# Patient Record
Sex: Male | Born: 1940 | Race: White | Hispanic: No | Smoking: Never smoker
Health system: Southern US, Community
[De-identification: ages and names within clinical notes are randomized; demographics above are authoritative.]

## PROBLEM LIST (undated history)

## (undated) DIAGNOSIS — E785 Hyperlipidemia, unspecified: Secondary | ICD-10-CM

## (undated) DIAGNOSIS — I10 Essential (primary) hypertension: Secondary | ICD-10-CM

## (undated) DIAGNOSIS — I639 Cerebral infarction, unspecified: Secondary | ICD-10-CM

## (undated) HISTORY — PX: BACK SURGERY: SHX140

---

## 2000-12-25 ENCOUNTER — Encounter: Payer: Self-pay | Admitting: Family Medicine

## 2000-12-25 LAB — CONVERTED CEMR LAB: TSH: 1.51 microintl units/mL

## 2002-04-15 ENCOUNTER — Encounter: Payer: Self-pay | Admitting: Family Medicine

## 2002-04-15 LAB — CONVERTED CEMR LAB
Blood Glucose, Fasting: 97 mg/dL
PSA: 6.8 ng/mL
TSH: 1.82 microintl units/mL

## 2003-10-24 ENCOUNTER — Encounter: Payer: Self-pay | Admitting: Family Medicine

## 2003-10-24 LAB — CONVERTED CEMR LAB: Blood Glucose, Fasting: 101 mg/dL

## 2004-12-25 ENCOUNTER — Ambulatory Visit: Payer: Self-pay | Admitting: Family Medicine

## 2004-12-25 LAB — CONVERTED CEMR LAB
RBC count: 4.61 10*6/uL
TSH: 1.76 microintl units/mL

## 2005-05-01 ENCOUNTER — Ambulatory Visit: Payer: Self-pay | Admitting: Family Medicine

## 2005-05-06 ENCOUNTER — Ambulatory Visit: Payer: Self-pay | Admitting: Family Medicine

## 2005-05-22 ENCOUNTER — Ambulatory Visit: Payer: Self-pay | Admitting: Family Medicine

## 2005-06-04 ENCOUNTER — Ambulatory Visit: Payer: Self-pay | Admitting: Pulmonary Disease

## 2005-06-18 ENCOUNTER — Ambulatory Visit: Payer: Self-pay | Admitting: Family Medicine

## 2005-07-03 ENCOUNTER — Ambulatory Visit: Payer: Self-pay | Admitting: Pulmonary Disease

## 2005-08-05 ENCOUNTER — Ambulatory Visit: Payer: Self-pay | Admitting: Family Medicine

## 2005-08-07 ENCOUNTER — Ambulatory Visit: Payer: Self-pay | Admitting: Family Medicine

## 2005-11-06 ENCOUNTER — Ambulatory Visit: Payer: Self-pay | Admitting: Family Medicine

## 2005-12-23 ENCOUNTER — Ambulatory Visit: Payer: Self-pay | Admitting: Family Medicine

## 2005-12-23 LAB — CONVERTED CEMR LAB: PSA: 6.79 ng/mL

## 2005-12-25 ENCOUNTER — Ambulatory Visit: Payer: Self-pay | Admitting: Family Medicine

## 2006-03-26 ENCOUNTER — Ambulatory Visit: Payer: Self-pay | Admitting: Family Medicine

## 2006-03-26 LAB — CONVERTED CEMR LAB: PSA: 5.56 ng/mL

## 2006-05-06 ENCOUNTER — Ambulatory Visit: Payer: Self-pay | Admitting: Family Medicine

## 2006-07-29 ENCOUNTER — Ambulatory Visit: Payer: Self-pay | Admitting: Family Medicine

## 2006-07-29 LAB — CONVERTED CEMR LAB
PSA: 6.57 ng/mL
TSH: 1.91 microintl units/mL

## 2006-07-31 ENCOUNTER — Ambulatory Visit: Payer: Self-pay | Admitting: Family Medicine

## 2006-09-09 ENCOUNTER — Ambulatory Visit: Payer: Self-pay | Admitting: Family Medicine

## 2006-10-08 ENCOUNTER — Ambulatory Visit: Payer: Self-pay | Admitting: Family Medicine

## 2006-11-02 ENCOUNTER — Ambulatory Visit: Payer: Self-pay | Admitting: Family Medicine

## 2007-01-18 ENCOUNTER — Ambulatory Visit: Payer: Self-pay | Admitting: Family Medicine

## 2007-01-25 ENCOUNTER — Ambulatory Visit: Payer: Self-pay | Admitting: Family Medicine

## 2007-02-01 ENCOUNTER — Ambulatory Visit: Payer: Self-pay | Admitting: Family Medicine

## 2007-08-24 ENCOUNTER — Encounter: Payer: Self-pay | Admitting: Family Medicine

## 2007-08-24 LAB — CONVERTED CEMR LAB: RBC count: 4.64 10*6/uL

## 2007-09-08 ENCOUNTER — Telehealth: Payer: Self-pay | Admitting: Family Medicine

## 2007-11-15 ENCOUNTER — Telehealth (INDEPENDENT_AMBULATORY_CARE_PROVIDER_SITE_OTHER): Payer: Self-pay | Admitting: *Deleted

## 2008-10-26 ENCOUNTER — Ambulatory Visit: Payer: Self-pay | Admitting: Gastroenterology

## 2008-12-04 ENCOUNTER — Telehealth: Payer: Self-pay | Admitting: Family Medicine

## 2008-12-04 ENCOUNTER — Encounter: Payer: Self-pay | Admitting: Family Medicine

## 2008-12-04 DIAGNOSIS — N411 Chronic prostatitis: Secondary | ICD-10-CM | POA: Insufficient documentation

## 2008-12-04 DIAGNOSIS — I7300 Raynaud's syndrome without gangrene: Secondary | ICD-10-CM

## 2008-12-04 DIAGNOSIS — R972 Elevated prostate specific antigen [PSA]: Secondary | ICD-10-CM

## 2008-12-04 DIAGNOSIS — I8000 Phlebitis and thrombophlebitis of superficial vessels of unspecified lower extremity: Secondary | ICD-10-CM | POA: Insufficient documentation

## 2008-12-04 DIAGNOSIS — G2581 Restless legs syndrome: Secondary | ICD-10-CM

## 2008-12-04 DIAGNOSIS — E7800 Pure hypercholesterolemia, unspecified: Secondary | ICD-10-CM | POA: Insufficient documentation

## 2014-02-24 ENCOUNTER — Ambulatory Visit: Payer: Self-pay | Admitting: Gastroenterology

## 2014-03-01 LAB — PATHOLOGY REPORT

## 2020-03-22 MED ORDER — ACETAMINOPHEN 325 MG PO TABS
650.00 | ORAL_TABLET | ORAL | Status: DC
Start: ? — End: 2020-03-22

## 2020-03-22 MED ORDER — LIDOCAINE HCL 1 % IJ SOLN
0.50 | INTRAMUSCULAR | Status: DC
Start: ? — End: 2020-03-22

## 2020-03-22 MED ORDER — CLOPIDOGREL BISULFATE 75 MG PO TABS
75.00 | ORAL_TABLET | ORAL | Status: DC
Start: 2020-03-22 — End: 2020-03-22

## 2020-03-22 MED ORDER — PSYLLIUM 95 % PO PACK
1.00 | PACK | ORAL | Status: DC
Start: 2020-03-22 — End: 2020-03-22

## 2020-03-22 MED ORDER — CYANOCOBALAMIN 500 MCG PO TABS
1000.00 | ORAL_TABLET | ORAL | Status: DC
Start: 2020-03-22 — End: 2020-03-22

## 2020-03-22 MED ORDER — SODIUM CHLORIDE FLUSH 0.9 % IV SOLN
5.00 | INTRAVENOUS | Status: DC
Start: 2020-03-21 — End: 2020-03-22

## 2020-03-22 MED ORDER — AMLODIPINE BESYLATE 5 MG PO TABS
5.00 | ORAL_TABLET | ORAL | Status: DC
Start: 2020-03-22 — End: 2020-03-22

## 2020-03-22 MED ORDER — ENOXAPARIN SODIUM 40 MG/0.4ML ~~LOC~~ SOLN
40.00 | SUBCUTANEOUS | Status: DC
Start: 2020-03-22 — End: 2020-03-22

## 2020-03-22 MED ORDER — FINASTERIDE 5 MG PO TABS
5.00 | ORAL_TABLET | ORAL | Status: DC
Start: 2020-03-22 — End: 2020-03-22

## 2020-03-22 MED ORDER — FAMOTIDINE 20 MG PO TABS
20.00 | ORAL_TABLET | ORAL | Status: DC
Start: 2020-03-22 — End: 2020-03-22

## 2020-03-22 MED ORDER — SIMVASTATIN 40 MG PO TABS
80.00 | ORAL_TABLET | ORAL | Status: DC
Start: 2020-03-21 — End: 2020-03-22

## 2020-03-22 MED ORDER — GENERIC EXTERNAL MEDICATION
Status: DC
Start: ? — End: 2020-03-22

## 2020-03-22 MED ORDER — CARBOXYMETHYLCELLULOSE SODIUM 0.5 % OP SOLN
1.00 | OPHTHALMIC | Status: DC
Start: ? — End: 2020-03-22

## 2020-03-28 ENCOUNTER — Ambulatory Visit: Payer: Self-pay | Admitting: Physical Therapy

## 2020-03-28 ENCOUNTER — Ambulatory Visit: Payer: Medicare HMO | Attending: Adult Health Nurse Practitioner | Admitting: Occupational Therapy

## 2020-03-28 ENCOUNTER — Encounter: Payer: Self-pay | Admitting: Occupational Therapy

## 2020-03-28 ENCOUNTER — Other Ambulatory Visit: Payer: Self-pay

## 2020-03-28 DIAGNOSIS — R278 Other lack of coordination: Secondary | ICD-10-CM

## 2020-03-28 DIAGNOSIS — M6281 Muscle weakness (generalized): Secondary | ICD-10-CM

## 2020-03-28 NOTE — Therapy (Addendum)
[facility] MAIN [facility] SERVICES [address] [place], [place], [address] Phone: [phone]   Fax:  [phone]  Occupational Therapy Evaluation  Patient Details  Name: Edward Campbell MRN: [id number] Date of Birth: 07/29/1941 Referring Provider (OT): Dr.  Rolin Barry   Encounter Date: 03/28/2020  OT End of Session - 03/28/20 1220    Visit Number  1    Number of Visits  12    Date for OT Re-Evaluation  06/20/20    Authorization Type  FOTO    Authorization Time Period  Progress report period starting    OT Start Time  1103    OT Stop Time  1200    OT Time Calculation (min)  57 min    Activity Tolerance  Patient tolerated treatment well    Behavior During Therapy  [facility] for tasks assessed/performed       History reviewed. No pertinent past medical history.  History reviewed. No pertinent surgical history.  There were no vitals filed for this visit.  Subjective Assessment - 03/28/20 1206    Subjective   Pt. reports planning to move into another accessible apartment soon.    Patient is accompanied by:  Family member    Pertinent History  Pt. is an 79 y.o. male who was admitted to [facility] on 03/18/2020 for 5 days with a CVA involving the LUE. Pt. plans to have meals on wheels begin soon. Pt. resides home alone, however has strong support form his spiritual family.    Limitations  LUE strength, motor control, and [facility] skills.    Currently in Pain?  No/denies        [facility] OT Assessment - 03/28/20 0001      Assessment   Medical Diagnosis  CVA    Referring Provider (OT)  Dr.  Rolin Barry    Onset Date/Surgical Date  03/18/20    Hand Dominance  Right    Next MD Visit  04/02/2020      Precautions   Precautions  None      Balance Screen   Has the patient fallen in the past 6 months  No    Has the patient had a decrease in activity level because of a fear of falling?   No    Is the patient reluctant to leave their home because of a fear of  falling?   No      Home  Environment   Family/patient expects to be discharged to:  Private residence    Living Arrangements  Spouse/significant other    Available Help at Discharge  Friend(s)    Type of Home  Aartment    Home Access  Level entry    Home Layout  One level    Bathroom Shower/Tub  Tub/Shower unit;Walk-in Information systems manager - 2 wheels;Cane - single point;Shower seat;Hand held Public house manager    Lives With  Alone      Prior Function   Level of Independence  Independent    Vocation  Retired    Insurance underwriter work, Conservation officer, nature at [employer]      ADL   Eating/Feeding  Independent    Academic librarian  Toileting -  Catering manager independent      IADL   Prior Level of Function Shopping  Independent   Independent   Shopping  Needs to be accompanied on any shopping trip    Prior Level of Function Light Housekeeping  [place] house alone or with occasional assistance;Performs light daily tasks such as dishwashing, bed making    Prior Level of Function Meal Prep  Independent    Meal Prep  Able to complete simple warm meal prep;Able to complete simple cold meal and snack prep    Prior Level of Function Sales executive  Relies on family or friends for transportation    Prior Level of Function Medication Managment  Independent    Medication Management  Is responsible for taking medication in correct dosages at correct time    Prior Level of Function Loss adjuster, chartered financial matters independently (budgets, writes checks, pays rent, bills goes to bank), collects and keeps track of income       Mobility   Mobility Status  Independent      Written Expression   Dominant Hand  Right    Handwriting  100% legible      Vision - History   Baseline Vision  Wears glasses only for reading      Activity Tolerance   Activity Tolerance  Tolerates 10-20 min activity with multiple rests      Cognition   Overall Cognitive Status  Within Functional Limits for tasks assessed      Coordination   Right 9 Hole Peg Test  21 sec.    Left 9 Hole Peg Test  35 sec.      Strength   Overall Strength Comments  LUE strength 4+/5, RUE strength 5/5      Hand Function   Right Hand Grip (lbs)  88    Right Hand Lateral Pinch  20 lbs    Right Hand 3 Point Pinch  18 lbs    Left Hand Grip (lbs)  67    Left Hand Lateral Pinch  17 lbs    Left 3 point pinch  15 lbs      There. Ex:  Pt. was provided with green theraputty, and education about using it for grip strength, and lateral pinch strengthening. Plan  To have pt. bring it back next session to work on additional exercises, and provide him with a HEP.                OT Education - 03/28/20 1214    Education Details OT services POC, Green theraputty   Person(s) Educated  Patient    Methods  Explanation    Comprehension  Verbalized understanding          OT Long Term Goals - 03/28/20 1228      OT LONG TERM GOAL #1   Title  Pt. will improve LUE strength by 1 mm grade to improve ADLs, and IADL functioning.    Baseline  Eval: LUE strength: 4+/5    Time  12    Period  Weeks    Status  New    Target Date  06/20/20      OT LONG TERM GOAL #2   Title  Pt. will improve left grip strength by 10# to be able to improve his ability to hold  ADL objects  in his hand.    Baseline  Eval: limited Left gips strength, and ability to hold objects steady in his hand.    Time  12    Period  Weeks    Status  New    Target Date  06/20/20      OT LONG TERM GOAL #3   Title  Pt. will improve LUE motor control in order to independently reach up  to place items on a shelf.    Baseline  Eval: Limtied motor control with reaching.    Time  12    Period  Weeks    Status  New    Target Date  06/20/20      OT LONG TERM GOAL #4   Title  Pt. will improve Left hand FMC skills by 3 sec. to be able to manipulate small objects independently for ADLs.    Baseline  Eval: limited left hand [facility] skills.    Time  0    Period  Weeks    Status  New    Target Date  06/20/20            Plan - 03/28/20 1221    Clinical Impression Statement Pt. is an 79 y.o. 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 Pt. presents with limited  BUE strength, grip strength, impaired motor control, and [facility] skills. Pt.'s LUE is resolving, and he has returned to performing most ADLs independently. Pt. nwill benefit from skilled OT services to improve LUE strength,  grip strength, moor control, and [facility] skills in order to be able to hold items better, reach for objects with more control, and manipulate small objects with his left hand. Pt. Continues to work on these skills in order to work towards maximizing independence with ADLs, and IADLs..    Occupational performance deficits (Please refer to evaluation for details):  ADL's;IADL's    Rehab Potential  Excellent    Clinical Decision Making  Limited treatment options, no task modification necessary    Comorbidities Affecting Occupational Performance:  May have comorbidities impacting occupational performance    Modification or Assistance to Complete Evaluation   Min-Moderate modification of tasks or assist with assess necessary to complete eval    OT Frequency  1x / week    OT Duration  12 weeks    OT Treatment/Interventions  Self-care/ADL training;Therapeutic exercise;Patient/family education;DME and/or AE instruction;Therapeutic activities;Neuromuscular education    Consulted and Agree with Plan of Care  Patient       Patient will benefit from skilled therapeutic intervention in order to improve the following deficits and  impairments:           Visit Diagnosis: Muscle weakness (generalized)  Other lack of coordination    Problem List Patient Active Problem List   Diagnosis Date Noted  . HYPERCHOLESTEROLEMIA 12/04/2008  . 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 SUPERFICIAL PHLEBITIS 12/04/2008  . CHRONIC PROSTATITIS 12/04/2008  . PROSTATE SPECIFIC ANTIGEN, ELEVATED 12/04/2008    Olegario Messier, MS, OTR/L 03/28/2020, 12:35 PM  [facility] MAIN [facility] SERVICES [address] [place], [place], [address] Phone: [phone]   Fax:  [phone]  Name: Edward Campbell MRN: [id number] Date of Birth: [birth year]

## 2020-04-02 ENCOUNTER — Encounter: Payer: Medicare HMO | Admitting: Occupational Therapy

## 2020-04-04 ENCOUNTER — Encounter: Payer: Self-pay | Admitting: Occupational Therapy

## 2020-04-04 ENCOUNTER — Ambulatory Visit: Payer: Medicare HMO | Admitting: Occupational Therapy

## 2020-04-04 ENCOUNTER — Other Ambulatory Visit: Payer: Self-pay

## 2020-04-04 DIAGNOSIS — M6281 Muscle weakness (generalized): Secondary | ICD-10-CM | POA: Diagnosis not present

## 2020-04-04 NOTE — Therapy (Signed)
[place] MAIN [facility] SERVICES [address] [place], [place], [address] Phone: [phone]   Fax:  [phone]  Occupational Therapy Treatment  Patient Details  Name: Edward Campbell MRN: [id number] Date of Birth: 1941-05-20 Referring Provider (OT): Dr.  Johny Drilling   Encounter Date: 04/04/2020  OT End of Session - 04/04/20 1224    Visit Number  2    Number of Visits  12    Date for OT Re-Evaluation  06/20/20    Authorization Type  FOTO    Authorization Time Period  Progress report period starting 04/04/2020    OT Start Time  1145    OT Stop Time  1230    OT Time Calculation (min)  45 min    Activity Tolerance  Patient tolerated treatment well    Behavior During Therapy  [facility] for tasks assessed/performed       History reviewed. No pertinent past medical history.  History reviewed. No pertinent surgical history.  There were no vitals filed for this visit.  Subjective Assessment - 04/04/20 1308    Subjective   Pt. reports being in the process of moving    Patient is accompanied by:  Family member    Currently in Pain?  No/denies      OT TREATMENT    Neuro muscular re-education:  Pt. worked on using his left hand for grasping, and manipulating 1", 3/4", 1/2" washers from a magnetic dish using point grasp pattern. Pt. worked on reaching up, stabilizing, and sustaining shoulder elevation while placing the washer over a small precise target on vertical dowels positioned at various angles. Pt. Required cues for motor control, and coordination when placing the washers onto the dowels.  Therapeutic Exercise:  Pt. Worked on green thearputty ex. For left hand strengthening. Exercises included: gross gripping, gross digit extension, thumb abduction, lateral, and 3pt. Pinch strengthening, digit abduction, and thumb opposition. Pt. Was provided with a visual handout HEP through [employer] with a personal access code. Pt. required verbal and tactile  cues for proper technique. Pt. Performed left  gross gripping with grip strengthener. Pt. worked on sustaining grip while grasping pegs and reaching at various heights. The Gripper was set at 23.4#. Pt. worked on Occupational hygienist with reaching up to place the pegs into the container, while k  Pt. Worked on left grip strengthening using the digital dynamometer. L: 55.6#, 54.8#, 54.8#, 54.8#, 54.5#   Pt. reports being in the process of moving. Pt. reports having had his second dose of the COVID-19 vaccine yesterday. Pt. is making progress. Pt. required verbal cues, and cues for visual demonstration of proper techniques with the theraputty. Pt. Continues to present with limited Motor control, and [facility] skills when reaching up, and placing the objects at a specific target. Pt. Continues to work on improving LUE strength, motor control, and [facility] skills in order to work towards improving, and maximizing independence with ADLs, and IADLs.                          OT Education - 04/04/20 1224    Education Details  OT services POC, Green theraputty    Person(s) Educated  Patient    Methods  Explanation    Comprehension  Verbalized understanding          OT Long Term Goals - 03/28/20 1228      OT LONG TERM GOAL #1   Title  Pt. will improve  LUE strength by 1 mm grade to improve ADLs, and IADL functioning.    Baseline  Eval: LUE strength: 4+/5    Time  12    Period  Weeks    Status  New    Target Date  06/20/20      OT LONG TERM GOAL #2   Title  Pt. will improve left grip strength by 10# to be able to improve his ability to hold ADL objects  in his hand.    Baseline  Eval: limited Left gips strength, and ability to hold objects steady in his hand.    Time  12    Period  Weeks    Status  New    Target Date  06/20/20      OT LONG TERM GOAL #3   Title  Pt. will improve LUE motor control in order to independently reach up to place items on a shelf.    Baseline  Eval:  Limtied motor control with reaching.    Time  12    Period  Weeks    Status  New    Target Date  06/20/20      OT LONG TERM GOAL #4   Title  Pt. will improve Left hand FMC skills by 3 sec. to be able to manipulate small objects independently for ADLs.    Baseline  Eval: limited left hand [facility] skills.    Time  0    Period  Weeks    Status  New    Target Date  06/20/20            Plan - 04/04/20 1310    Clinical Impression Statement  Pt. reports being in the process of moving. Pt. reports having had his second dose of the COVID-19 vaccine yesterday. Pt. is making progress. Pt. required verbal cues, and cues for visual demonstration of proper techniques with the theraputty. Pt. Continues to present with limited Motor control, and [facility] skills when reaching up, and placing the objects at a specific target. Pt. Continues to work on improving LUE strength, motor control, and [facility] skills in order to work towards improving, and maximizing independence with ADLs, and IADLs.   Occupational performance deficits (Please refer to evaluation for details):  ADL's;IADL's    Rehab Potential  Excellent    Clinical Decision Making  Limited treatment options, no task modification necessary    Comorbidities Affecting Occupational Performance:  May have comorbidities impacting occupational performance    Modification or Assistance to Complete Evaluation   Min-Moderate modification of tasks or assist with assess necessary to complete eval    OT Frequency  1x / week    OT Duration  12 weeks    OT Treatment/Interventions  Self-care/ADL training;Therapeutic exercise;Patient/family education;DME and/or AE instruction;Therapeutic activities;Neuromuscular education    Consulted and Agree with Plan of Care  Patient       Patient will benefit from skilled therapeutic intervention in order to improve the following deficits and impairments:           Visit Diagnosis: Muscle weakness (generalized)    Problem  List Patient Active Problem List   Diagnosis Date Noted  . HYPERCHOLESTEROLEMIA 12/04/2008  . 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 SUPERFICIAL PHLEBITIS 12/04/2008  . CHRONIC PROSTATITIS 12/04/2008  . PROSTATE SPECIFIC ANTIGEN, ELEVATED 12/04/2008    Olegario Messier, MS, OTR/L 04/04/2020, 1:12 PM  [facility] [place] MAIN [facility] SERVICES [address] [place], [place],  [address] Phone: [phone]   Fax:  [phone]  Name: PRATYUSH AMMON MRN: [id number] Date of Birth: Apr 24, 1941

## 2020-04-09 ENCOUNTER — Encounter: Payer: Medicare HMO | Admitting: Occupational Therapy

## 2020-04-11 ENCOUNTER — Ambulatory Visit: Payer: Medicare HMO | Attending: Adult Health Nurse Practitioner | Admitting: Occupational Therapy

## 2020-04-11 ENCOUNTER — Ambulatory Visit: Payer: Medicare HMO | Admitting: Speech Pathology

## 2020-04-12 ENCOUNTER — Ambulatory Visit: Payer: Self-pay | Admitting: Physical Therapy

## 2020-04-16 ENCOUNTER — Encounter: Payer: Medicare HMO | Admitting: Occupational Therapy

## 2020-04-18 ENCOUNTER — Ambulatory Visit: Payer: Medicare HMO | Admitting: Occupational Therapy

## 2020-04-18 ENCOUNTER — Ambulatory Visit: Payer: Medicare HMO | Admitting: Speech Pathology

## 2020-04-23 ENCOUNTER — Encounter: Payer: Medicare HMO | Admitting: Occupational Therapy

## 2020-04-25 ENCOUNTER — Ambulatory Visit: Payer: Medicare HMO | Admitting: Occupational Therapy

## 2020-04-30 ENCOUNTER — Encounter: Payer: Medicare HMO | Admitting: Occupational Therapy

## 2020-05-02 ENCOUNTER — Encounter: Payer: Medicare HMO | Admitting: Speech Pathology

## 2020-05-02 ENCOUNTER — Ambulatory Visit: Payer: Medicare HMO | Admitting: Occupational Therapy

## 2020-05-09 ENCOUNTER — Encounter: Payer: Medicare HMO | Admitting: Occupational Therapy

## 2020-05-09 ENCOUNTER — Encounter: Payer: Medicare HMO | Admitting: Speech Pathology

## 2020-05-15 ENCOUNTER — Ambulatory Visit: Payer: Medicare HMO | Attending: Gerontology | Admitting: Speech Pathology

## 2020-05-15 DIAGNOSIS — R41841 Cognitive communication deficit: Secondary | ICD-10-CM | POA: Insufficient documentation

## 2020-05-16 ENCOUNTER — Encounter: Payer: Self-pay | Admitting: Speech Pathology

## 2020-05-16 NOTE — Therapy (Signed)
[place] MAIN [facility] SERVICES [address] [place], [place], [address] Phone: [phone]   Fax:  [phone]  Speech Language Pathology Treatment  Patient Details  Name: Edward Campbell MRN: [id number] Date of Birth: [birth year] Referring Provider (SLP): Willette Cluster   Encounter Date: 05/15/2020  End of Session - 05/16/20 1317    Visit Number  1    Number of Visits  1    Authorization Type  [employer]    SLP Start Time  1330    SLP Stop Time   1430    SLP Time Calculation (min)  60 min    Activity Tolerance  Patient tolerated treatment well       History reviewed. No pertinent past medical history.  History reviewed. No pertinent surgical history.  There were no vitals filed for this visit.  Subjective Assessment - 05/16/20 1309    Subjective  pt pleasant, conversant, social    Currently in Pain?  No/denies        SLP Evaluation [facility] - 05/16/20 1309      SLP Visit Information   SLP Received On  05/15/20    Referring Provider (SLP)  Willette Cluster    Onset Date  03/10/2020    Medical Diagnosis  CVA      Subjective   Subjective  pt alert, attentive, conversant    Patient/Family Stated Goal  wants to know if he process normal cognitive aiblity for his age      General Information   HPI  Pt with recent acute ischemic stroke (04/06/2020) - MRI revealed: Redemonstrated multiple embolic infarcts within the right cerebral cortical, subcortical and periventricular white matter, with additional infarcts within the right frontal and right occipital lobes, increased as compared to March 18, 2020. Pt with history of hx of prior right MCA distribution strokes, hypertension, hyperlipidemia, and DVT.      Prior Functional Status   Cognitive/Linguistic Baseline  Within functional limits    Type of Home  House     Lives With  Alone    Vocation  Retired      Associate Professor   Overall Cognitive Status  Within Functional Limits for tasks  assessed      Auditory Comprehension   Overall Auditory Comprehension  Appears within functional limits for tasks assessed      Visual Recognition/Discrimination   Discrimination  Within Function Limits      Reading Comprehension   Reading Status  Within funtional limits      Expression   Primary Mode of Expression  Verbal      Verbal Expression   Overall Verbal Expression  Appears within functional limits for tasks assessed      Written Expression   Dominant Hand  Right    Written Expression  Within Functional Limits      Oral Motor/Sensory Function   Overall Oral Motor/Sensory Function  Appears within functional limits for tasks assessed      Motor Speech   Overall Motor Speech  Appears within functional limits for tasks assessed      Standardized Assessments   Standardized Assessments   Cognitive Linguistic Quick Test       Cognitive Linguistic Quick Test  The Cognitive Linguistic Quick Test (CLQT) was administered to assess the relative status of five cognitive domains: attention, memory, language, executive functioning, and visuospatial skills. Scores from 10 tasks were used to estimate severity ratings (for age groups 18-69 years and 70-89 years) for each  domain, a clock drawing task, as well as an overall composite severity rating of cognition.  Task Score     Criterion Cut Scores  Personal Facts 8/8     8  Symbol Cancellation 11/12    10  Confrontation Naming 10/10    10  Clock Drawing 13/13     11  Story Retelling 5/10     5  Symbol Trails 10/10     6  Generative Naming 3/9    4  Design Memory 4/6     4  Mazes 8/8      4  Design Generation 13/13    5  Cognitive Domain Composite Score Severity Rating  Attention 186/215 WNL  Memory 129/185 Mild  Executive Function 28/40 WNL  Language 26/37 Mild  Visuospatial Skills 85/105 WNL  Clock Drawing 13/13 WNL  Composite Severity Rating WNL     SLP Education - 05/16/20 1317    Education  Details  education provided on results of assessment    Person(s) Educated  Patient    Methods  Explanation;Demonstration    Comprehension  Verbalized understanding           Plan - 05/16/20 1317    Clinical Impression Statement  Pt presents with functional cognitive linguistic skills that are not only within normal limits but demonstrate ability to perform higher level executive function. This is supported by pt's scores on the Cognitive Linguistic Quick Test.  See chart of results listed by section. Additionally, pt's speech-language skills were deemed to be within normal limits. Pt reports some baseline deficits with memory prior to recent strokes and he is able to describe very effecient strategies that he created to help with memory tasks. At this time, skilled ST intervention isn't warrented given functional cognitive linguistic abilities.    Consulted and Agree with Plan of Care  Patient       Patient will benefit from skilled therapeutic intervention in order to improve the following deficits and impairments:   Cognitive communication deficit    Problem List Patient Active Problem List   Diagnosis Date Noted  . HYPERCHOLESTEROLEMIA 12/04/2008  . 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 SUPERFICIAL PHLEBITIS 12/04/2008  . CHRONIC PROSTATITIS 12/04/2008  . PROSTATE SPECIFIC ANTIGEN, ELEVATED 12/04/2008    Braniyah Besse B. Dreama Saa M.S., CCC-SLP, [facility] Speech-Language Pathologist Rehabilitation Services Office [phone]  Reuel Derby 05/16/2020, 1:23 PM  [facility] MAIN [facility] SERVICES [address] [place], [place], [address] Phone: [phone]   Fax:  [phone]   Name: Edward Campbell MRN: [id number] Date of Birth: 09/08/1941

## 2020-05-17 ENCOUNTER — Ambulatory Visit: Payer: Medicare HMO | Admitting: Speech Pathology

## 2020-05-23 ENCOUNTER — Encounter: Payer: Medicare HMO | Admitting: Speech Pathology

## 2020-05-25 ENCOUNTER — Encounter: Payer: Medicare HMO | Admitting: Speech Pathology

## 2020-05-29 ENCOUNTER — Encounter: Payer: Medicare HMO | Admitting: Speech Pathology

## 2020-05-29 ENCOUNTER — Ambulatory Visit: Payer: Medicare HMO | Admitting: Occupational Therapy

## 2020-05-29 ENCOUNTER — Ambulatory Visit: Payer: Medicare HMO

## 2020-05-31 ENCOUNTER — Encounter: Payer: Medicare HMO | Admitting: Speech Pathology

## 2020-05-31 ENCOUNTER — Ambulatory Visit: Payer: Medicare HMO

## 2020-05-31 ENCOUNTER — Encounter: Payer: Medicare HMO | Admitting: Occupational Therapy

## 2020-06-05 ENCOUNTER — Ambulatory Visit: Payer: Medicare HMO | Admitting: Occupational Therapy

## 2020-06-05 ENCOUNTER — Encounter: Payer: Medicare HMO | Admitting: Speech Pathology

## 2020-06-07 ENCOUNTER — Ambulatory Visit: Payer: Medicare HMO | Admitting: Occupational Therapy

## 2020-06-07 ENCOUNTER — Ambulatory Visit: Payer: Medicare HMO

## 2020-06-07 ENCOUNTER — Other Ambulatory Visit: Payer: Self-pay

## 2020-06-07 ENCOUNTER — Encounter: Payer: Medicare HMO | Admitting: Speech Pathology

## 2020-06-07 ENCOUNTER — Ambulatory Visit: Payer: Medicare HMO | Attending: Gerontology

## 2020-06-07 DIAGNOSIS — R2681 Unsteadiness on feet: Secondary | ICD-10-CM | POA: Insufficient documentation

## 2020-06-07 DIAGNOSIS — R2689 Other abnormalities of gait and mobility: Secondary | ICD-10-CM | POA: Diagnosis present

## 2020-06-07 DIAGNOSIS — R41841 Cognitive communication deficit: Secondary | ICD-10-CM | POA: Diagnosis present

## 2020-06-07 DIAGNOSIS — M6281 Muscle weakness (generalized): Secondary | ICD-10-CM | POA: Insufficient documentation

## 2020-06-07 DIAGNOSIS — R278 Other lack of coordination: Secondary | ICD-10-CM | POA: Insufficient documentation

## 2020-06-07 NOTE — Therapy (Signed)
Edward Campbell [facility]IN [facility] [address] [place], [place], [address] Phone: [phone]   Fax:  [phone]  Physical Therapy Evaluation  Patient Details  Name: Edward Campbell MRN: [id number] Date of Birth: [birth year] No data recorded  Encounter Date: 06/07/2020   PT End of Session - 06/07/20 1708    Visit Number 1    Number of Visits 16    Date for PT Re-Evaluation 08/02/20    Authorization Type 1/10 eval 06/07/20    PT Start Time 1015    PT Stop Time 1110    PT Time Calculation (min) 55 min    Equipment Utilized During Treatment Gait belt    Activity Tolerance Patient tolerated treatment well    Behavior During Therapy [facility] for tasks assessed/performed           History reviewed. No pertinent past medical history.  History reviewed. No pertinent surgical history.  There were no vitals filed for this visit.    Subjective Assessment - 06/07/20 1022    Subjective Patient is a pleasant 79 year old male who presents to physical therapy s/p CVA    Pertinent History Patient is an 79 year old male with hx of prior R MCA stroke, HTN, HLD, and DVT. He had two ischemic strokes in <3 weeks (4/11 and 4/28)  with acute left arm weakness, numbness and dysarthria. Imaging on 4/12 positive for PFO with an WF > 55%, mild LVH and no thrombus. He also has an atrial septal aneurysm PMh includes  He had a driving safety test on 6/96/296/28/21 but was not cleared to return to driving yet. Has meals on wheels to help with lunch. Apartment complex has a gym    Limitations Lifting;Standing;Walking;House hold activities    How long can you sit comfortably? n/a    How long can you stand comfortably? not sure    How long can you walk comfortably? drags L foot after a half a mile    Patient Stated Goals get well enough to drive a car.    Currently in Pain? No/denies              [facility] - 06/07/20 0001      Assessment   Medical Diagnosis CVA     Onset Date/Surgical Date 03/18/20    Hand Dominance Right      Precautions   Precautions None      Restrictions   Weight Bearing Restrictions No      Home Environment   Living Environment Private residence    Living Arrangements Alone    Available Help at Discharge Friend(s)    Type of Home Apartment    Home Access Level entry    Home Layout One level    Home Equipment Grab bars - tub/shower;Grab bars - toilet;Shower seat - built in;Cane - single point;Walker - 2 wheels      Prior Function   Level of Independence Independent with basic ADLs    Vocation Retired    Leisure read the bible -      Standardized Landscape architectBalance Assessment   Standardized Balance Assessment Berg Balance Test      Berg Balance Test   Sit to Stand Able to stand without using hands and stabilize independently    Standing Unsupported Able to stand safely 2 minutes    Sitting with Back Unsupported but Feet Supported on Floor or Stool Able to sit safely and securely 2 minutes  Stand to Sit Sits safely with minimal use of hands    Transfers Able to transfer safely, minor use of hands    Standing Unsupported with Eyes Closed Able to stand 10 seconds with supervision    Standing Unsupported with Feet Together Able to place feet together independently and stand for 1 minute with supervision    From Standing, Reach Forward with Outstretched Arm Can reach forward >12 cm safely (5")    From Standing Position, Pick up Object from Floor Able to pick up shoe, needs supervision    From Standing Position, Turn to Look Behind Over each Shoulder Turn sideways only but maintains balance    Turn 360 Degrees Needs close supervision or verbal cueing    Standing Unsupported, Alternately Place Feet on Step/Stool Able to complete >2 steps/needs minimal assist    Standing Unsupported, One Foot in Front Able to take small step independently and hold 30 seconds    Standing on One Leg Able to lift leg independently and hold 5-10 seconds     Total Score 41                PAIN: No pain   POSTURE: Forward head rounded shoulders, forward trunk lean, tilt/rotated to the right   PROM/AROM:  AROM BLE: Hip extension: limited bilaterally 10% L df: 3 degrees  L PF: WFL L ankle resting position: inverted and plantarflexed  STRENGTH:  Graded on a 0-5 scale Muscle Group Left Right  Hip Flex 4-/5 4/5  Hip Abd 3+/5 4-/5  Hip Add 3/5 4-/5  Hip Ext 3/5 3/5  Hip IR/ER 3+/5 4-/5  Knee Flex 3+/5 4/5  Knee Ext 4-/5 4/5  Ankle DF 2+/5 4/5  Ankle PF 4-/5 4/5   SENSATION:   BLE : reports within functional however has spatial deficit of LLE     SOMATOSENSORY:  Any N & T in extremities or weakness: reports :         Sensation           Intact      Diminished         Absent  Light touch LEs Reports no diminished light touch                             COORDINATION: Finger to Nose: Dysmetric LUE with slow speed and limited task performance Heel shin test: dysmetric LLE, WFL RLE    SPECIAL TESTS: Vision:  Follow pen tip : WFL Visual field: slight visual deficit of L lateral and lower fields   FUNCTIONAL MOBILITY: STS: weight shift onto RLE than LLE. Able to perform without UE support  BALANCE: Dynamic Sitting Balance  Normal Able to sit unsupported and weight shift across midline maximally   Good Able to sit unsupported and weight shift across midline moderately   Good-/Fair+ Able to sit unsupported and weight shift across midline minimally   Fair Minimal weight shifting ipsilateral/front, difficulty crossing midline x  Fair- Reach to ipsilateral side and unable to weight shift   Poor + Able to sit unsupported with min A and reach to ipsilateral side, unable to weight shift   Poor Able to sit unsupported with mod A and reach ipsilateral/front-can't cross midline     Standing Dynamic Balance  Normal Stand independently unsupported, able to weight shift and cross midline maximally   Good Stand independently  unsupported, able to weight shift and cross midline moderately   Good-/Fair+  Stand independently unsupported, able to weight shift across midline minimally   Fair Stand independently unsupported, weight shift, and reach ipsilaterally, loss of balance when crossing midline x  Poor+ Able to stand with Min A and reach ipsilaterally, unable to weight shift   Poor Able to stand with Mod A and minimally reach ipsilaterally, unable to cross midline.     Static Sitting Balance  Normal Able to maintain balance against maximal resistance   Good Able to maintain balance against moderate resistance   Good-/Fair+ Accepts minimal resistance   Fair Able to sit unsupported without balance loss and without UE support x  Poor+ Able to maintain with Minimal assistance from individual or chair   Poor Unable to maintain balance-requires mod/max support from individual or chair     Static Standing Balance  Normal Able to maintain standing balance against maximal resistance   Good Able to maintain standing balance against moderate resistance   Good-/Fair+ Able to maintain standing balance against minimal resistance   Fair Able to stand unsupported without UE support and without LOB for 1-2 min x  Fair- Requires Min A and UE support to maintain standing without loss of balance   Poor+ Requires mod A and UE support to maintain standing without loss of balance   Poor Requires max A and UE support to maintain standing balance without loss       GAIT: L foot inversion, hip drop, and foot slap with prolonged ambulation. No AD required, slight L drift with bumping into objects if not cued for spatial orientation.   OUTCOME MEASURES: TEST Outcome Interpretation  5 times sit<>stand 11.50 with arms crossed sec >60 yo, >15 sec indicates increased risk for falls  10 meter walk test       7.9 seconds           m/s <1.0 m/s indicates increased risk for falls; limited community ambulator  FOTO 63 Discharge score  expected to be 71  6 minute walk test       1159         Feet 1000 feet is community Forensic scientist Assessment 41/56  <36/56 (100% risk for falls), 37-45 (80% risk for falls); 46-51 (>50% risk for falls); 52-55 (lower risk <25% of falls)              Objective measurements completed on examination: See above findings.             PT Short Term Goals - 06/07/20 1717      PT SHORT TERM GOAL #1   Title Patient will be independent in home exercise program to improve strength/mobility for better functional independence with ADLs    Baseline 7/1 HEP given    Time 2    Period Weeks    Status New    Target Date 06/21/20             PT Long Term Goals - 06/07/20 1735      PT LONG TERM GOAL #1   Title Patient will increase FOTO score to equal to or greater than 71    to demonstrate statistically significant improvement in mobility and quality of life.    Baseline 7/1: 63%    Time 8    Period Weeks    Status New    Target Date 08/02/20      PT LONG TERM GOAL #2   Title Patient will demonstrate an improved Berg Balance Score of >47/56 as to demonstrate improved  balance with ADLs such as sitting/standing and transfer balance and reduced fall risk.    Baseline 7/1: 41/56    Time 8    Period Weeks    Status New    Target Date 08/02/20      PT LONG TERM GOAL #3   Title Patient will increase six minute walk test distance to >1500 for progression to community ambulator age norm and improve gait ability    Baseline 7/1: 1159 ft with excessive foot drag    Time 8    Period Weeks    Status New    Target Date 08/02/20      PT LONG TERM GOAL #4   Title Patient will increase BLE gross strength to 4+/5 as to improve functional strength for independent gait, increased standing tolerance and increased ADL ability.    Baseline 7/1: see chart    Time 8    Period Weeks    Status New    Target Date 08/02/20      PT LONG TERM GOAL #5   Title Patient will safely  negotiate obstacles in a crowded room without running into object on his left, with good foot clearance, and arm swing    Baseline 7/1: unable to    Time 8    Period Weeks    Status New    Target Date 08/02/20                  Plan - 06/07/20 1709    Clinical Impression Statement Patient is a pleasant 79 year old male who presents for L sided weakness and instability s/p two CVA's. Patient has limited spatial awareness of L side with frequent need for cueing to avoid objects as well as limited sequencing/recall of negotiation of hallways/turns. Patient ambulates with left shoulder depressed and arm hanging at his side. His left lower extremity does not consistently clear the floor. Patient will benefit from skilled physical therapy to increase strength, ambulatory mechanics, balance, and coordination for decreased fall risk and return to PLOF.    Personal Factors and Comorbidities Age;Comorbidity 3+;Finances;Past/Current Experience;Transportation    Comorbidities stroke, HLD, lumbar radiculopathy, hypercholesterolemia, restless legs syndrome, raynaud's syndrome, superficial phlebitis, chronic prostatis,    Examination-Activity Limitations Bend;Carry;Hygiene/Grooming;Locomotion Level;Squat;Stairs;Stand    Examination-Participation Restrictions Church;Cleaning;Community Activity;Driving;Volunteer;Shop;Yard Work    Conservation officer, historic buildings Evolving/Moderate complexity    Clinical Decision Making Moderate    Rehab Potential Fair    PT Frequency 2x / week    PT Duration 8 weeks    PT Treatment/Interventions ADLs/Self Care Home Management;Aquatic Therapy;Biofeedback;Canalith Repostioning;Cryotherapy;Clinical cytogeneticist;Iontophoresis /ml Dexamethasone;Moist Heat;Ultrasound;Therapeutic exercise;Therapeutic activities;Functional mobility training;Stair training;DME Instruction;Balance training;Neuromuscular re-education;Patient/family education;Orthotic  Fit/Training;Manual techniques;Passive range of motion;Dry needling;Energy conservation;Splinting;Taping;Vestibular    PT Next Visit Plan ankle stability exercises, spatial awareness for L side, dual task.    PT Home Exercise Plan see above    Consulted and Agree with Plan of Care Patient           Patient will benefit from skilled therapeutic intervention in order to improve the following deficits and impairments:  Abnormal gait, Decreased activity tolerance, Decreased balance, Decreased endurance, Decreased coordination, Decreased cognition, Decreased knowledge of use of DME, Decreased mobility, Decreased safety awareness, Difficulty walking, Decreased strength, Impaired flexibility, Impaired perceived functional ability, Impaired UE functional use, Impaired vision/preception, Postural dysfunction, Improper body mechanics  Visit Diagnosis: Other abnormalities of gait and mobility  Unsteadiness on feet  Muscle weakness (generalized)     Problem List Patient Active Problem List  Diagnosis Date Noted  . HYPERCHOLESTEROLEMIA 12/04/2008  . 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 SUPERFICIAL PHLEBITIS 12/04/2008  . CHRONIC PROSTATITIS 12/04/2008  . PROSTATE SPECIFIC ANTIGEN, ELEVATED 12/04/2008   Precious Bard, PT, DPT   06/07/2020, 5:42 PM  [place] [facility] MAIN [facility] SERVICES [address] [place], [place], [address] Phone: [phone]   Fax:  [phone]  Name: CHRISS MANNAN MRN: [id number] Date of Birth: 06/11/1941

## 2020-06-07 NOTE — Patient Instructions (Signed)
   Access Code: [email or web address] URL: [email or web address] Date: 06/07/2020 Prepared by: Precious Bard  Exercises Standing March with Counter Support - 1 x daily - 7 x weekly - 10 reps - 3 sets Standing Hip Extension - 1 x daily - 7 x weekly - 10 reps - 3 sets Standing Knee Flexion - 1 x daily - 7 x weekly - 10 reps - 3 sets Standing Hip Abduction with Counter Support - 1 x daily - 7 x weekly - 10 reps - 3 sets Mini Squat with Counter Support - 1 x daily - 7 x weekly - 10 reps - 3 sets Heel Toe Raises with Counter Support - 1 x daily - 7 x weekly - 2 sets - 10 reps - 5 hold

## 2020-06-13 ENCOUNTER — Encounter: Payer: Medicare HMO | Admitting: Occupational Therapy

## 2020-06-13 ENCOUNTER — Encounter: Payer: Medicare HMO | Admitting: Speech Pathology

## 2020-06-18 ENCOUNTER — Ambulatory Visit: Payer: Medicare HMO | Admitting: Speech Pathology

## 2020-06-20 ENCOUNTER — Other Ambulatory Visit: Payer: Self-pay

## 2020-06-20 ENCOUNTER — Encounter: Payer: Medicare HMO | Admitting: Speech Pathology

## 2020-06-20 ENCOUNTER — Ambulatory Visit: Payer: Medicare HMO | Admitting: Occupational Therapy

## 2020-06-20 ENCOUNTER — Encounter: Payer: Self-pay | Admitting: Occupational Therapy

## 2020-06-20 DIAGNOSIS — R278 Other lack of coordination: Secondary | ICD-10-CM

## 2020-06-20 DIAGNOSIS — R2689 Other abnormalities of gait and mobility: Secondary | ICD-10-CM | POA: Diagnosis not present

## 2020-06-20 DIAGNOSIS — M6281 Muscle weakness (generalized): Secondary | ICD-10-CM

## 2020-06-20 NOTE — Therapy (Signed)
[facility] MAIN [facility] SERVICES [address] [place], [place], [address] Phone: [phone]   Fax:  [phone]  Occupational Therapy Evaluation  Patient Details  Name: Edward Campbell MRN: [id number] Date of Birth: [birth year] Referring Provider (OT): Dr.  Rolin Barry   Encounter Date: 06/20/2020   OT End of Session - 06/20/20 1456    Visit Number 1    Number of Visits 24    Date for OT Re-Evaluation 09/12/20    Authorization Type FOTO    Authorization Time Period Progress report period starting 06/20/2020    OT Start Time 1023    OT Stop Time 1133    OT Time Calculation (min) 70 min    Equipment Utilized During Treatment dynamometer, 9 hold peg test, pinch test.    Activity Tolerance Patient tolerated treatment well    Behavior During Therapy [facility] for tasks assessed/performed           History reviewed. No pertinent past medical history.  History reviewed. No pertinent surgical history.  There were no vitals filed for this visit.   Subjective Assessment - 06/20/20 1408    Subjective  Pt reports having just moved to a lower level apartment and states that his 3 sons came in from various locations to assist in putting items away.    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 Other pertinent medical history: includes HTN, HLD, and DVT.    Limitations LUE strength, motor control, and [facility] skills.    Special Tests Vision: scanning in tact, peripheral vision in tact in all 4 quadrants, appropriate tracking and convergence.    Patient Stated Goals To improve coordination and ultimate goal is to drive again    Currently in Pain? No/denies             [facility] OT Assessment - 06/20/20 0001      Assessment   Prior Therapy seen by OT in  this setting in April after initial stroke.       Precautions   Precautions None      Restrictions   Weight Bearing Restrictions No      Balance Screen   Has the patient fallen in the past 6 months No    Has the patient had a decrease in activity level because of a fear of falling?  Yes    Is the patient reluctant to leave their home because of a fear of falling?  No      Home  Environment   Family/patient expects to be discharged to: Private residence    Living Arrangements Alone    Available Help at Discharge Friend(s)    Type of Home Aartment    Home Access Level entry    Home Layout One level    Bathroom Shower/Tub Walk-in [employer] - 2 wheels;Cane - single point;Shower seat;Hand held shower head    Additional Comments Church friends help with transportation and pick up pt's grocery deliveries. Pt gets medications delivered through [facility]. Has Meals [employer] Wheels drop off lunches.       Prior Function   Level of Independence Independent with basic ADLs    Vocation Retired    Leisure read [employer] and enjoys  walking around complex/to mailbox for exercise      ADL   Eating/Feeding Independent    Grooming Independent    Lower Body Bathing Independent    Upper Body Dressing Independent    Lower Body Dressing Independent    Toilet Transfer Independent    Toileting - Clothing Manipulation Independent    Toileting -  Hygiene Independent    Tub/Shower Transfer Independent      IADL   Prior Level of Function Shopping Has been ordering groceries and getting church friends to pick up order since stroke in April.     Prior Level of Function Light Housekeeping Independent    Meal Prep Able to complete simple warm meal prep;Able to complete simple cold meal and snack prep    Prior Level of Function Best boyCommunity Mobility Independent    Community Mobility Relies on family or friends for transportation    Prior Level of Function Medication Managment Independent    Prior  Level of Function Financial Management Independent      Mobility   Mobility Status Independent      Written Expression   Dominant Hand Right    Handwriting 100% legible      Vision - History   Baseline Vision Wears glasses only for reading    Patient Visual Report Other (comment)   Demos L spatial awareness deficit, but vision intact.      Vision Assessment   Eye Alignment Within Functional Limits    Vision Assessment Vision tested    Ocular Range of Motion Within Functional Limits    Alignment/Gaze Preference Within Defined Limits    Tracking/Visual Pursuits Able to track stimulus in all quads without difficulty    Saccades Within functional limits    Convergence Within functional limits    Visual Fields No apparent deficits      Activity Tolerance   Sitting Balance Moves/returns trunkal midpoint 1-2 inches in multiple planes      Cognition   Overall Cognitive Status Within Functional Limits for tasks assessed    Attention Focused    Focused Attention Appears intact      Posture/Postural Control   Posture/Postural Control No significant limitations      Sensation   Light Touch Appears Intact      Coordination   Gross Motor Movements are Fluid and Coordinated No   Left (R WFL)   Fine Motor Movements are Fluid and Coordinated No   Left (R WFL)   Coordination and Movement Description L UE positive for Dysdiadochokinesia (slower than R for RAM)    Finger Nose Finger Test dysemetric L UE with speed slightly decreased versus R. impacting task performance    Right 9 Hole Peg Test 27    Left 9 Hole Peg Test 39    Tremors --   light shake of L UE with high-reach with heavy object.     ROM / Strength   AROM / PROM / Strength AROM      AROM   Overall AROM  Within functional limits for tasks performed      Strength   Overall Strength Comments LUE strength 4-/5, RUE strength 5/5      Hand Function   Right Hand Grip (lbs) 85    Right Hand Lateral Pinch 20 lbs    Right  Hand 3 Point Pinch 20 lbs    Left Hand Grip (lbs) 59.67    Left Hand Lateral Pinch 16 lbs    Left 3 point pinch 15  lbs           Therapeutic activities: Instructed pt in digit flex/ext and abd/add with green theraputty and issued to pt for carryover with HEP. Pt with good understanding.                 OT Education - 06/20/20 1453    Education Details OT services POC/goals, Green theraputty    Person(s) Educated Patient    Methods Explanation    Comprehension Verbalized understanding;Returned demonstration            OT Short Term Goals - 06/20/20 1757      OT SHORT TERM GOAL #1   Title Pt will be compliant with HEP with green theraputty to increase fine motor control and independence.    Baseline 06/20/2020, exercises reviewed.    Time 4    Period Weeks    Status New    Target Date 07/18/20             OT Long Term Goals - 06/20/20 1759      OT LONG TERM GOAL #1   Title Pt will increase FOTO goal from 68 to 71 to improve performance of HH IADL tasks and overall QOL.    Baseline 7/14, FOTO score 68    Time 12    Period Weeks    Status New    Target Date 09/12/20      OT LONG TERM GOAL #2   Title Pt. will improve left grip strength by 10# to be able to improve his ability to hold ADL objects  in his hand.    Baseline 7/14 grip strength 59.67    Time 12    Period Weeks    Status New    Target Date 09/12/20      OT LONG TERM GOAL #3   Title Pt. will improve LUE motor control in order to independently reach up to place items on a shelf.    Baseline 7/14 limited gross motor control with reaching    Time 12    Period Weeks    Status New    Target Date 09/12/20      OT LONG TERM GOAL #4   Title Pt. will improve Left hand FMC skills by 3 sec. to be able to manipulate small objects independently for ADLs.    Baseline 7/14 L hand 39 seconds (R hand 27s)    Time 12    Period Weeks    Status New    Target Date 09/12/20                  Plan - 06/20/20 1503    Clinical Impression Statement Pt reports having moved apartments in the same complex just a few month prior to have easier access. He states that his son's came and visited to help him put items away. Pt reports that he enjoys walking around the complex and sometimes lightly "jogging" to/from mailbox area. Pt lives alone in apt and has assist from friends from church for transportation. Pt presents today with decreased strength and fine motor coordination of L UE. In addition, pt noted to have spatial awareness deficit to L side. Pt reports it has been like this since he had two srokes in April of this year (pt reports that strokes closely following COVID vaccines). Overall, Pt could benefit from continued skilled OT services to enhance L UE motor control, strength and coordination as well as safety to scan and recognize potential hazards  to his left. Anticipate OT would be beneficial to improve pt QOL and independence for I/ADLs.    OT Occupational Profile and History Detailed Assessment- Review of Records and additional review of physical, cognitive, psychosocial history related to current functional performance    Occupational performance deficits (Please refer to evaluation for details): ADL's;IADL's    Body Structure / Function / Physical Skills ADL;Balance;FMC;GMC;Coordination;IADL;Strength;UE functional use    Rehab Potential Excellent    Clinical Decision Making Limited treatment options, no task modification necessary    Comorbidities Affecting Occupational Performance: May have comorbidities impacting occupational performance    Modification or Assistance to Complete Evaluation  Min-Moderate modification of tasks or assist with assess necessary to complete eval    OT Frequency 2x / week    OT Duration 12 weeks    OT Treatment/Interventions Self-care/ADL training;Therapeutic exercise;Patient/family education;DME and/or AE instruction;Therapeutic activities;Neuromuscular  education    Plan Therapeutic activities to enhance FMC/control, self-care re-training to modify as needed, neuromuscular education to enhance L side awareness as able, patient education to enhance safety and carryover outside of therapy, Therapeutic exercise to improve strength as it pertains to safe and efficient performance of ADLs.    OT Home Exercise Plan Use green putty to increase digit flex/ext strength.    Consulted and Agree with Plan of Care Patient           Patient will benefit from skilled therapeutic intervention in order to improve the following deficits and impairments:   Body Structure / Function / Physical Skills: ADL, Balance, FMC, GMC, Coordination, IADL, Strength, UE functional use       Visit Diagnosis: Other lack of coordination  Muscle weakness (generalized)    Problem List Patient Active Problem List   Diagnosis Date Noted  . HYPERCHOLESTEROLEMIA 12/04/2008  . 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 SUPERFICIAL PHLEBITIS 12/04/2008  . CHRONIC PROSTATITIS 12/04/2008  . PROSTATE SPECIFIC ANTIGEN, ELEVATED 12/04/2008   Rejeana Brock, MS, OTR/L ascom [phone] 06/20/2020, 6:05 PM  [place] [facility] MAIN [facility] SERVICES [address] [place], [place], [address] Phone: [phone]   Fax:  [phone]  Name: Edward Campbell MRN: [id number] Date of Birth: 03-05-1941

## 2020-06-23 ENCOUNTER — Other Ambulatory Visit: Payer: Self-pay

## 2020-06-23 ENCOUNTER — Observation Stay
Admission: EM | Admit: 2020-06-23 | Discharge: 2020-06-25 | Disposition: A | Discharge status: Home / Self Care | Payer: Medicare HMO | Attending: Internal Medicine | Admitting: Internal Medicine

## 2020-06-23 ENCOUNTER — Observation Stay: Payer: Medicare HMO

## 2020-06-23 ENCOUNTER — Emergency Department: Payer: Medicare HMO

## 2020-06-23 DIAGNOSIS — I609 Nontraumatic subarachnoid hemorrhage, unspecified: Secondary | ICD-10-CM | POA: Diagnosis not present

## 2020-06-23 DIAGNOSIS — E785 Hyperlipidemia, unspecified: Secondary | ICD-10-CM | POA: Diagnosis not present

## 2020-06-23 DIAGNOSIS — Z20822 Contact with and (suspected) exposure to covid-19: Secondary | ICD-10-CM | POA: Insufficient documentation

## 2020-06-23 DIAGNOSIS — W19XXXA Unspecified fall, initial encounter: Secondary | ICD-10-CM | POA: Diagnosis not present

## 2020-06-23 DIAGNOSIS — S0181XA Laceration without foreign body of other part of head, initial encounter: Secondary | ICD-10-CM | POA: Diagnosis not present

## 2020-06-23 DIAGNOSIS — Y92410 Unspecified street and highway as the place of occurrence of the external cause: Secondary | ICD-10-CM | POA: Insufficient documentation

## 2020-06-23 DIAGNOSIS — S066X0A Traumatic subarachnoid hemorrhage without loss of consciousness, initial encounter: Secondary | ICD-10-CM | POA: Diagnosis not present

## 2020-06-23 DIAGNOSIS — E440 Moderate protein-calorie malnutrition: Secondary | ICD-10-CM | POA: Insufficient documentation

## 2020-06-23 DIAGNOSIS — Z515 Encounter for palliative care: Secondary | ICD-10-CM

## 2020-06-23 DIAGNOSIS — Y9301 Activity, walking, marching and hiking: Secondary | ICD-10-CM | POA: Insufficient documentation

## 2020-06-23 DIAGNOSIS — Y999 Unspecified external cause status: Secondary | ICD-10-CM | POA: Insufficient documentation

## 2020-06-23 DIAGNOSIS — I10 Essential (primary) hypertension: Secondary | ICD-10-CM | POA: Diagnosis not present

## 2020-06-23 HISTORY — DX: Cerebral infarction, unspecified: I63.9

## 2020-06-23 LAB — CBC
HCT: 35.4 % — ABNORMAL LOW (ref 39.0–52.0)
Hemoglobin: 12.3 g/dL — ABNORMAL LOW (ref 13.0–17.0)
MCH: 31.9 pg (ref 26.0–34.0)
MCHC: 34.7 g/dL (ref 30.0–36.0)
MCV: 91.9 fL (ref 80.0–100.0)
Platelets: 195 10*3/uL (ref 150–400)
RBC: 3.85 MIL/uL — ABNORMAL LOW (ref 4.22–5.81)
RDW: 13.3 % (ref 11.5–15.5)
WBC: 5.7 10*3/uL (ref 4.0–10.5)
nRBC: 0 % (ref 0.0–0.2)

## 2020-06-23 LAB — BASIC METABOLIC PANEL
Anion gap: 6 (ref 5–15)
BUN: 17 mg/dL (ref 8–23)
CO2: 26 mmol/L (ref 22–32)
Calcium: 8.5 mg/dL — ABNORMAL LOW (ref 8.9–10.3)
Chloride: 106 mmol/L (ref 98–111)
Creatinine, Ser: 1.3 mg/dL — ABNORMAL HIGH (ref 0.61–1.24)
GFR calc Af Amer: 60 mL/min — ABNORMAL LOW (ref >60)
GFR calc non Af Amer: 52 mL/min — ABNORMAL LOW (ref >60)
Glucose, Bld: 122 mg/dL — ABNORMAL HIGH (ref 70–99)
Potassium: 3.9 mmol/L (ref 3.5–5.1)
Sodium: 138 mmol/L (ref 135–145)

## 2020-06-23 LAB — APTT: aPTT: 31 seconds (ref 24–36)

## 2020-06-23 LAB — PROTIME-INR
INR: 1.3 — ABNORMAL HIGH (ref 0.8–1.2)
Prothrombin Time: 16 seconds — ABNORMAL HIGH (ref 11.4–15.2)

## 2020-06-23 LAB — SARS CORONAVIRUS 2 BY RT PCR (HOSPITAL ORDER, PERFORMED IN ~~LOC~~ HOSPITAL LAB): SARS Coronavirus 2: NEGATIVE

## 2020-06-23 IMAGING — CT CT HEAD W/O CM
3 series · 16 of 47 positions shown, 19 images · non-contrast
Comparison: CT earlier today at 3 p.m.

CLINICAL DATA: A follow-up from subarachnoid hemorrhage

EXAM:
CT HEAD WITHOUT CONTRAST
TECHNIQUE: Contiguous axial images were obtained from the base of the skull
through the vertex without intravenous contrast.

[Series 3: head wo · axial · 0.43mm/px · z∈[-96,+29]mm · 10 of 30 slices shown, 13 images]
[im 3/30  brain]
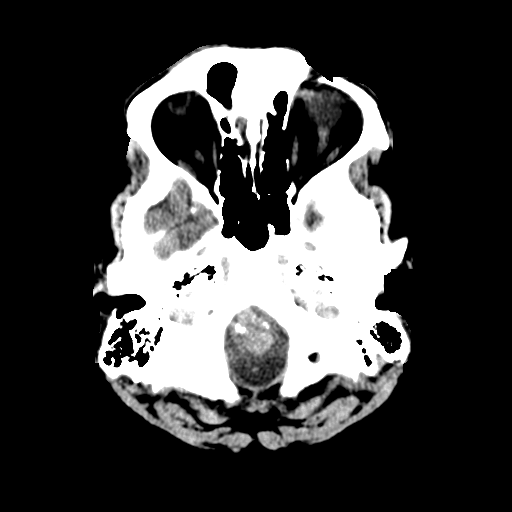
[im 3/30  bone]
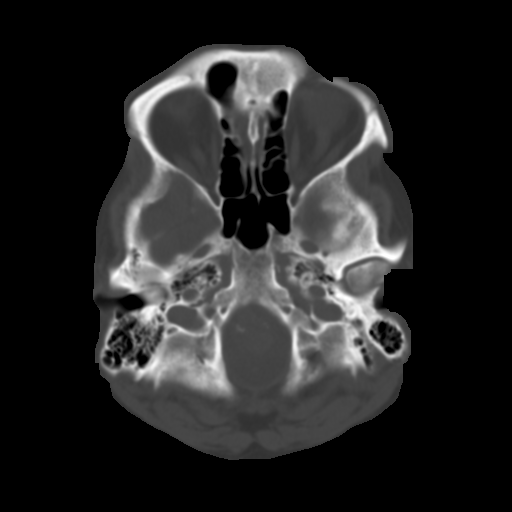
[im 6/30  brain]
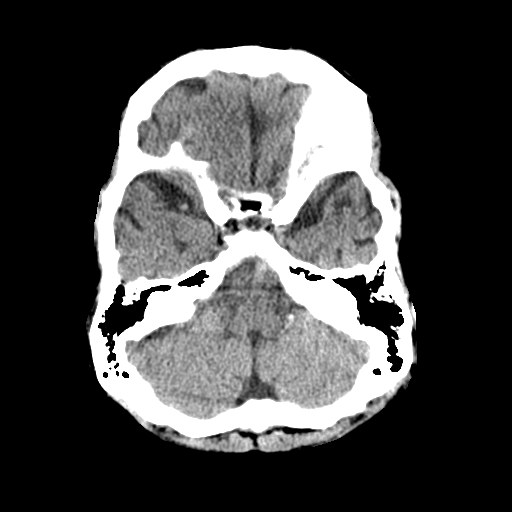
[im 9/30  brain]
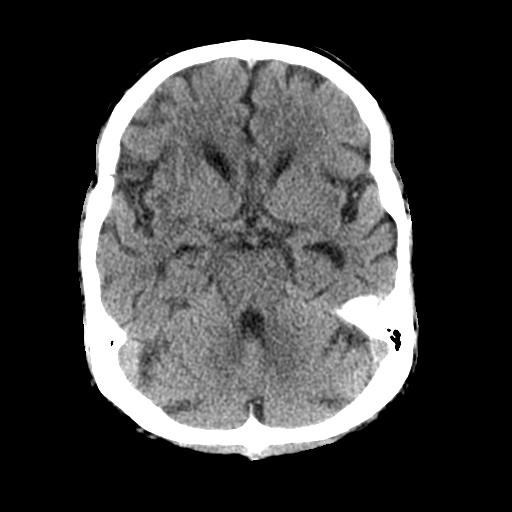
[im 11/30  brain]
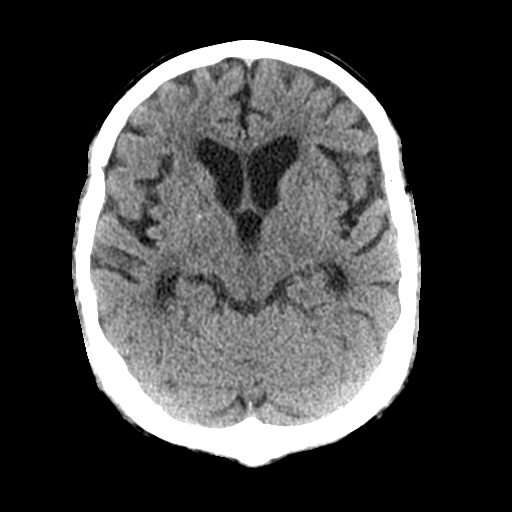
[im 14/30  brain]
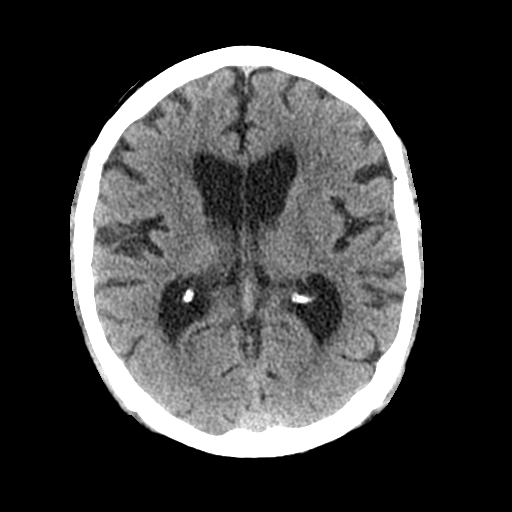
[im 14/30  bone]
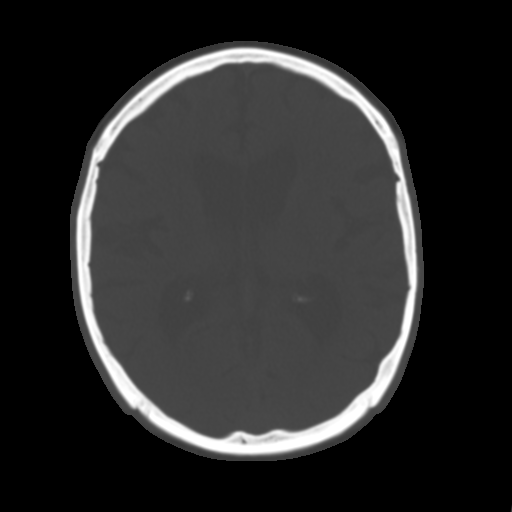
[im 17/30  brain]
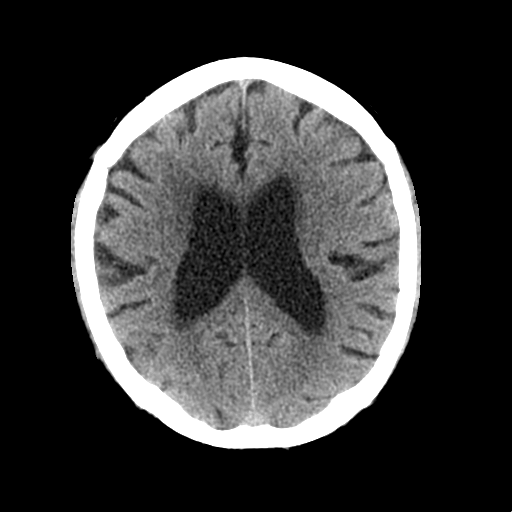
[im 20/30  brain]
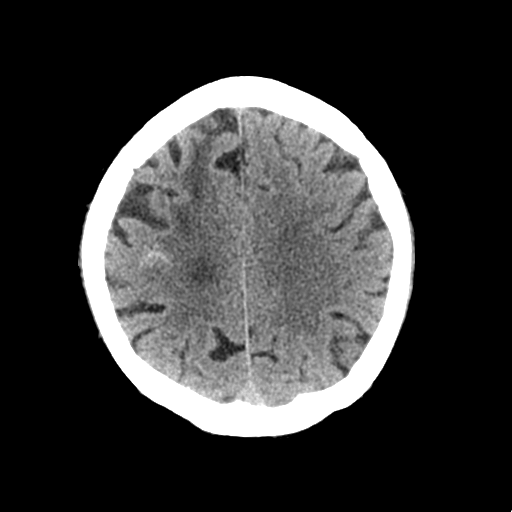
[im 23/30  brain]
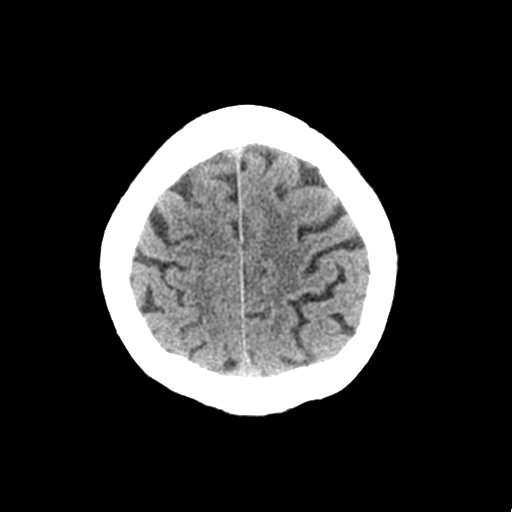
[im 25/30  brain]
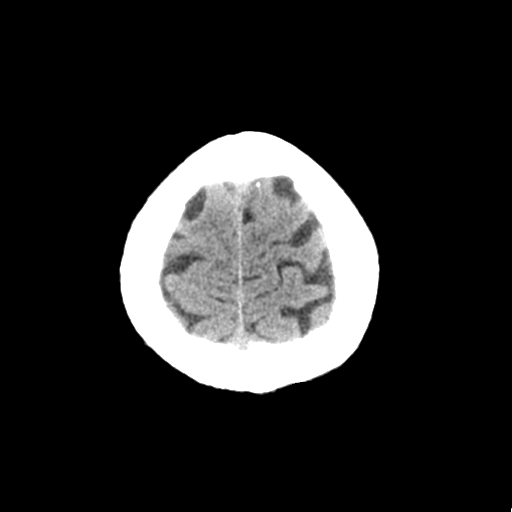
[im 25/30  bone]
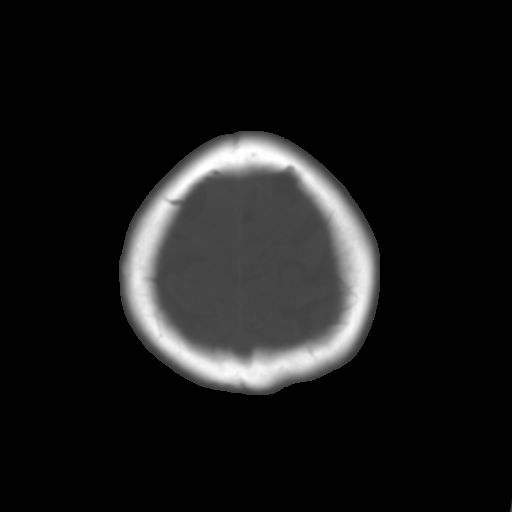
[im 28/30  brain]
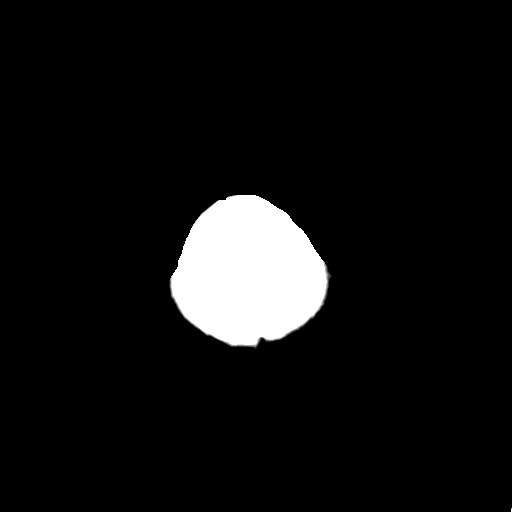

[Series 4: coronal soft tissue · coronal · 0.33mm/px · 3 of 60 slices shown]
[im 20/60  brain]
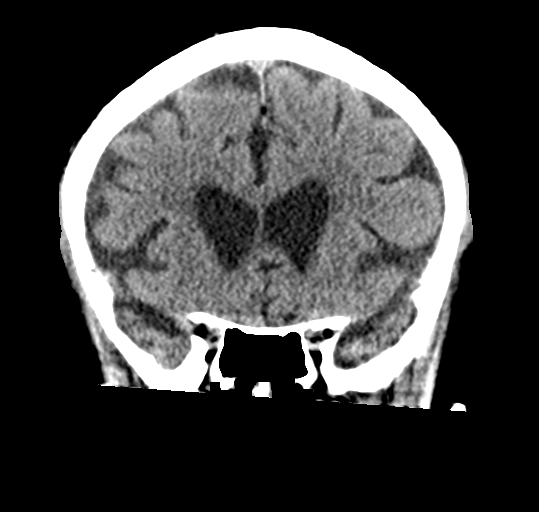
[im 27/60  brain]
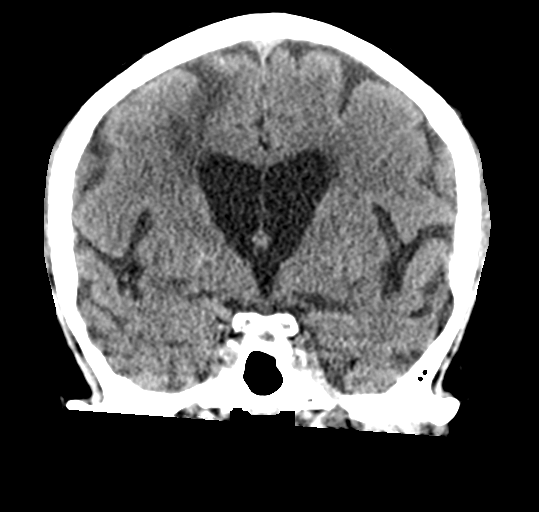
[im 33/60  brain]
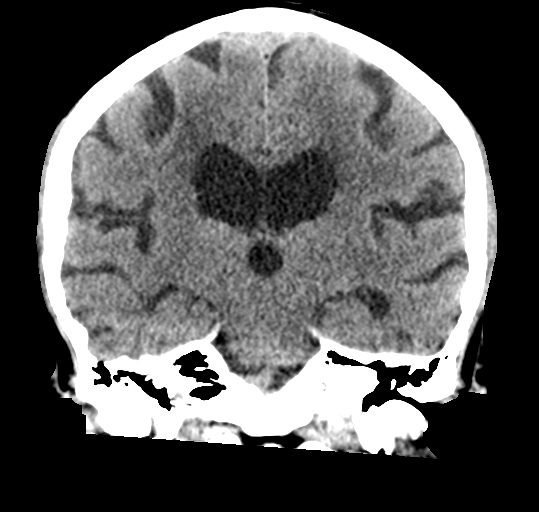

[Series 5: sagittal soft tissue · sagittal · 0.34mm/px · 3 of 54 slices shown]
[im 18/54  brain]
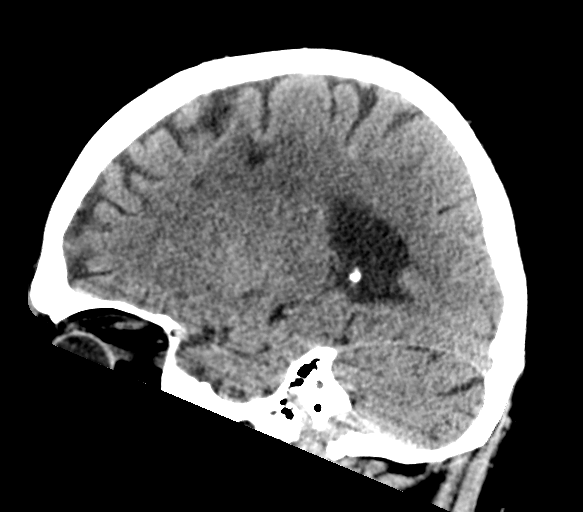
[im 27/54  brain]
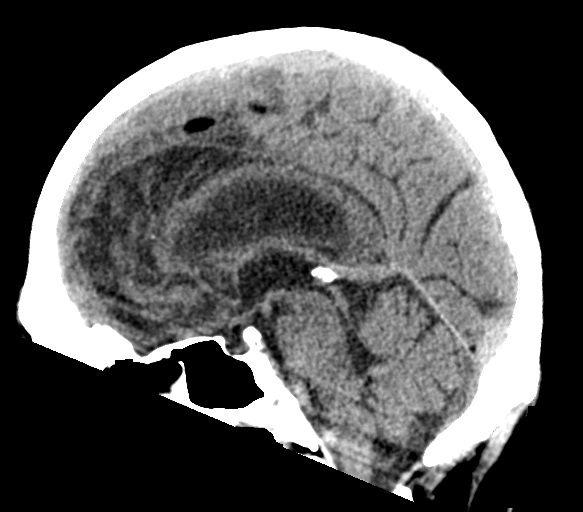
[im 36/54  brain]
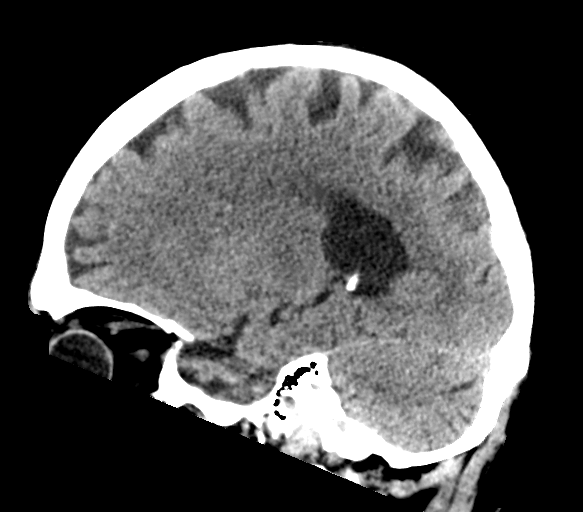

[16 of 47 positions shown; findings below may reference images not displayed]

FINDINGS: Brain: Again noted is a tiny amount of subarachnoid hemorrhage seen
overlying the right superior frontoparietal hemisphere. This is not
significantly changed since the prior exam. Area encephalomalacia is
again noted within the right frontal lobe. There is dilatation the
ventricles and sulci consistent with age-related atrophy.
Low-attenuation changes in the deep white matter consistent with
small vessel ischemia. No new extra-axial collections are noted.

Vascular: No hyperdense vessel or unexpected calcification.

Skull: The skull is intact. No fracture or focal lesion identified.

Sinuses/Orbits: The visualized paranasal sinuses and mastoid air
cells are clear. The orbits and globes intact.

Other: None
IMPRESSION: Stable small amount of probable subarachnoid hemorrhage overlying
the right frontoparietal convexity.

Findings consistent with age related atrophy and chronic small
vessel ischemia

Prior area of infarct with encephalomalacia involving the right
frontal lobe.

## 2020-06-23 IMAGING — CT CT MAXILLOFACIAL W/O CM
3 series · 14 of 47 positions shown, 16 images · non-contrast
Comparison: None.

CLINICAL DATA: Facial laceration after fall.

EXAM:
CT HEAD WITHOUT CONTRAST
CT MAXILLOFACIAL WITHOUT CONTRAST
CT CERVICAL SPINE WITHOUT CONTRAST
TECHNIQUE: Multidetector CT imaging of the head, cervical spine, and
maxillofacial structures were performed using the standard protocol
without intravenous contrast. Multiplanar CT image reconstructions
of the cervical spine and maxillofacial structures were also
generated.

[Series 2: max soft (person_name) · axial · 0.35mm/px · z∈[-178,-34]mm · 8 of 84 slices shown, 10 images]
[im 6/84  brain]
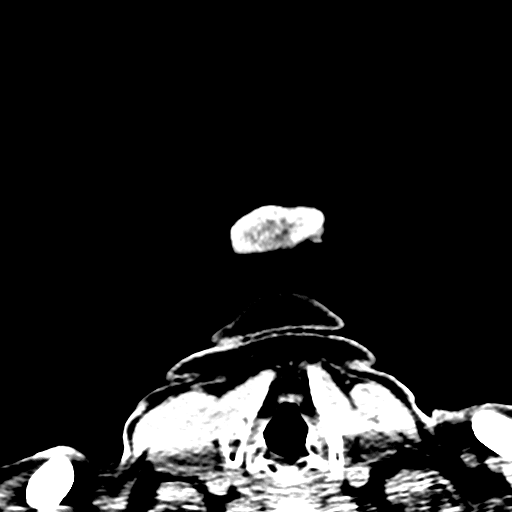
[im 6/84  bone]
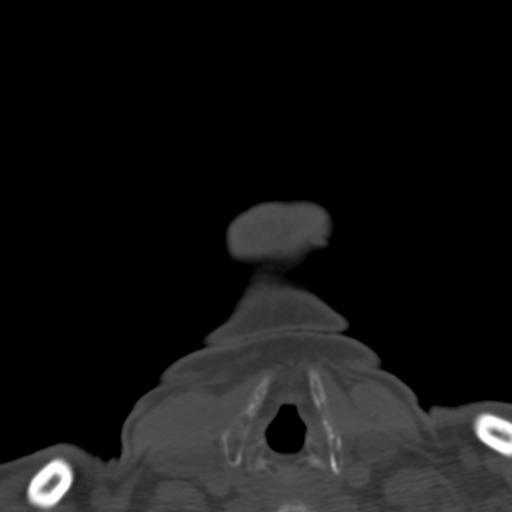
[im 18/84  bone]
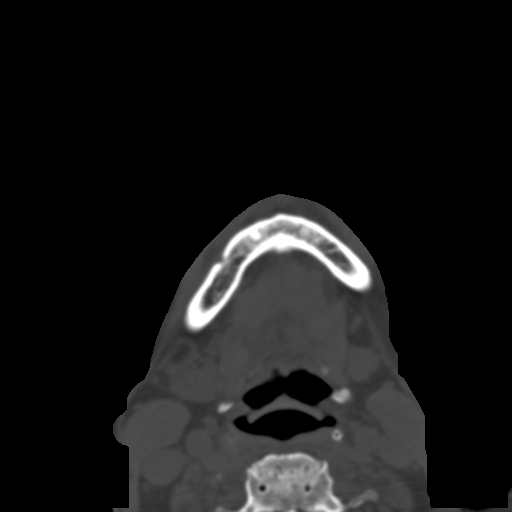
[im 26/84  bone]
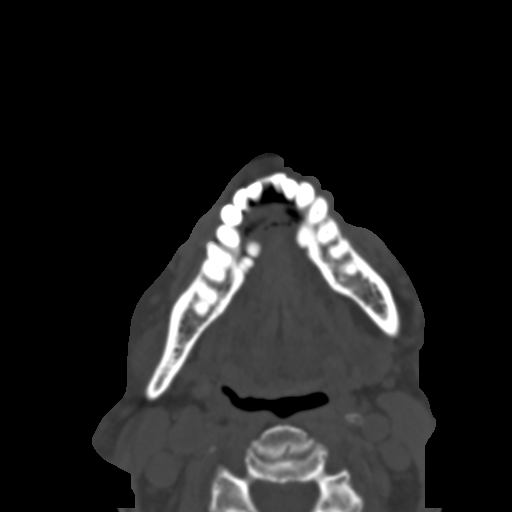
[im 38/84  bone]
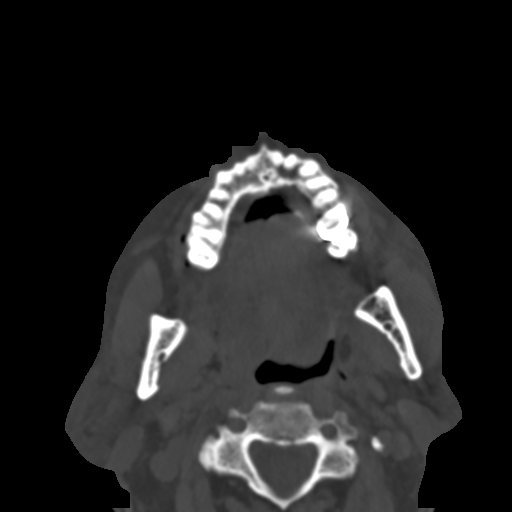
[im 46/84  brain]
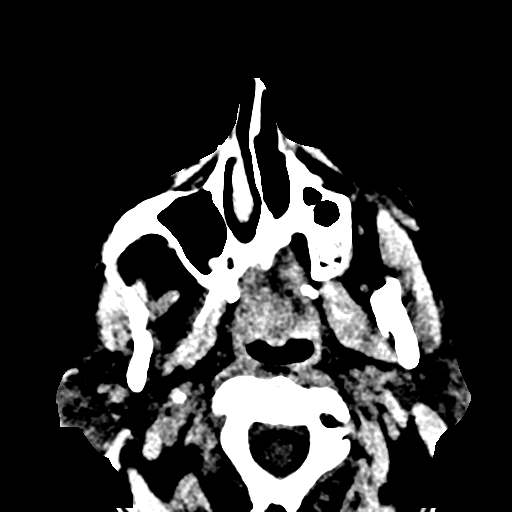
[im 46/84  bone]
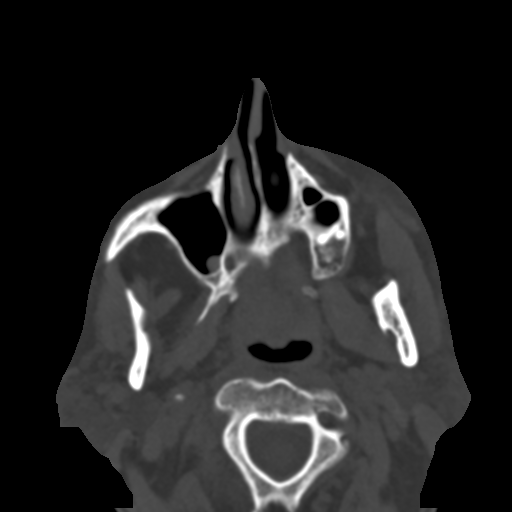
[im 58/84  bone]
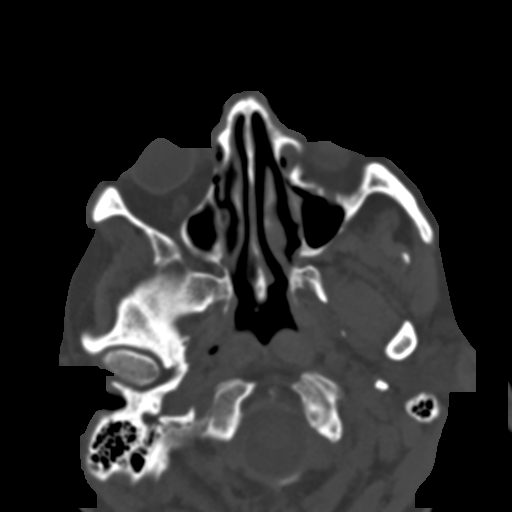
[im 66/84  bone]
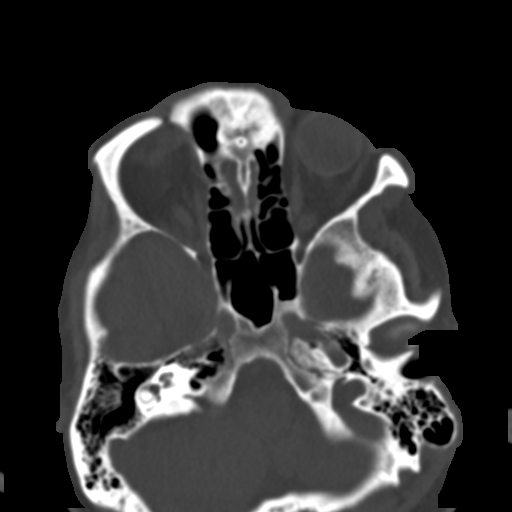
[im 78/84  bone]
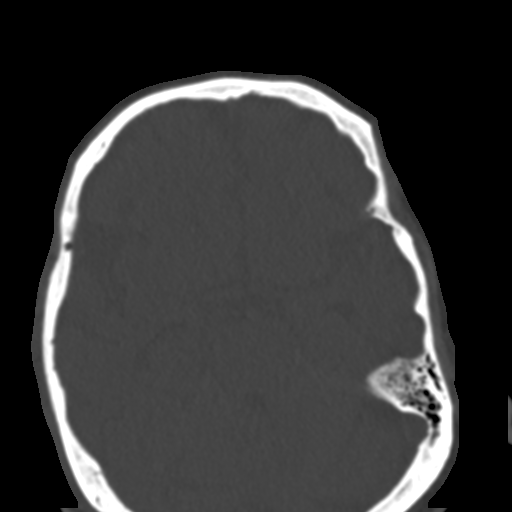

[Series 6: coronal soft · coronal · 0.38mm/px · 3 of 100 slices shown]
[im 34/100  bone]
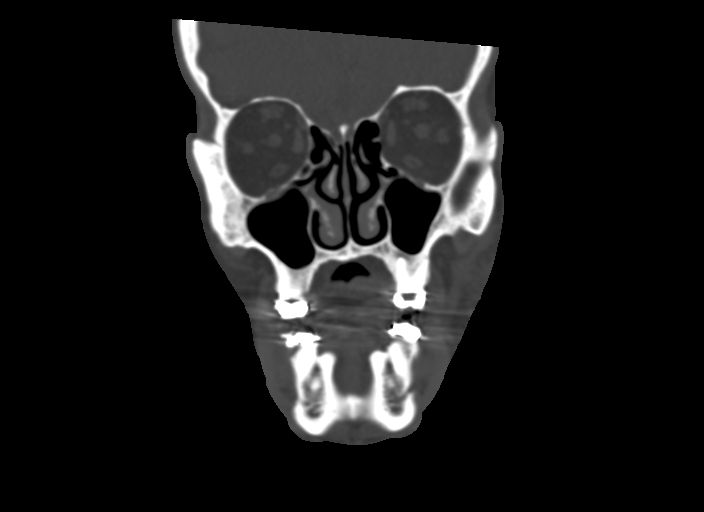
[im 45/100  bone]
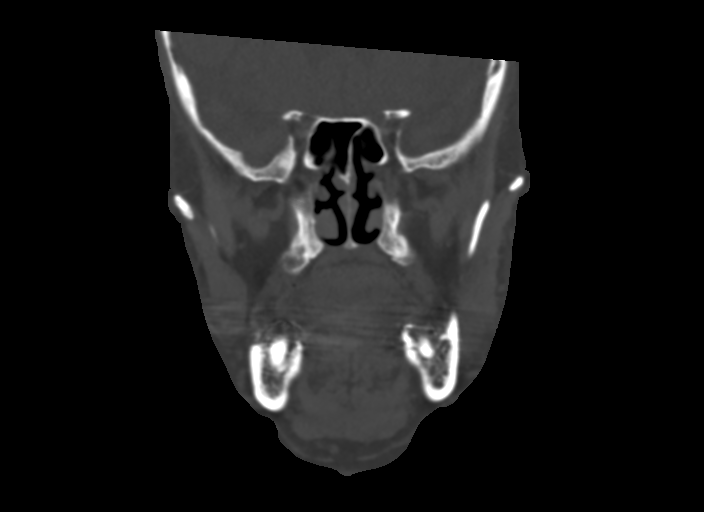
[im 56/100  bone]
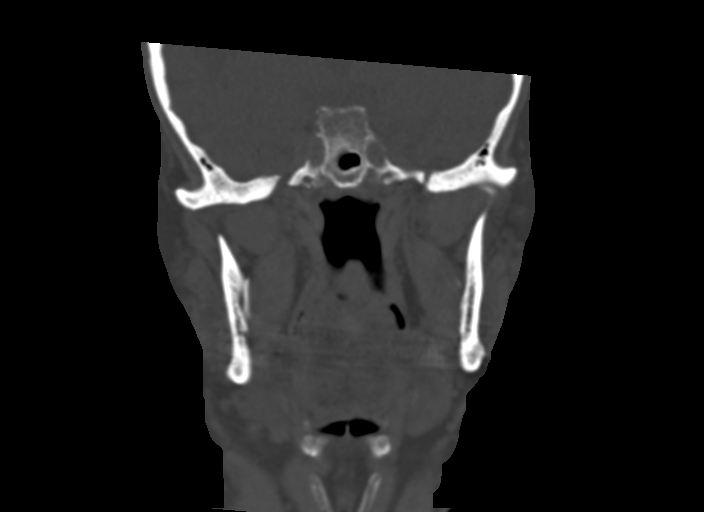

[Series 7: sagittal soft · sagittal · 0.37mm/px · 3 of 133 slices shown]
[im 47/133  bone]
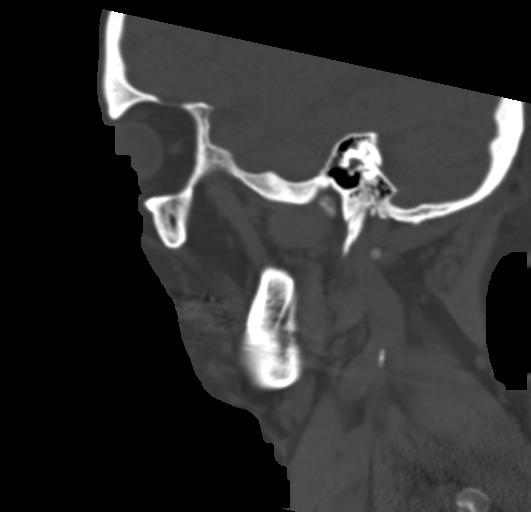
[im 67/133  bone]
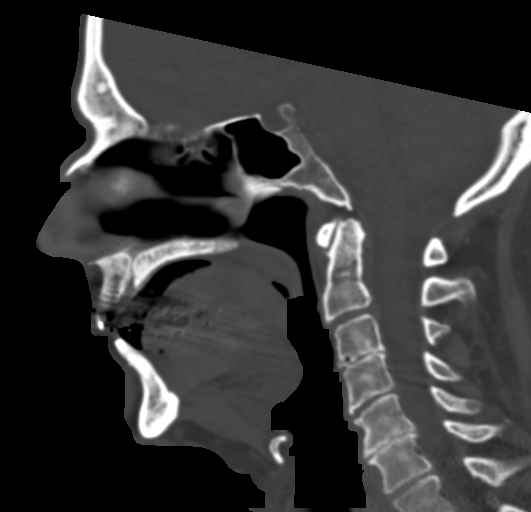
[im 86/133  bone]
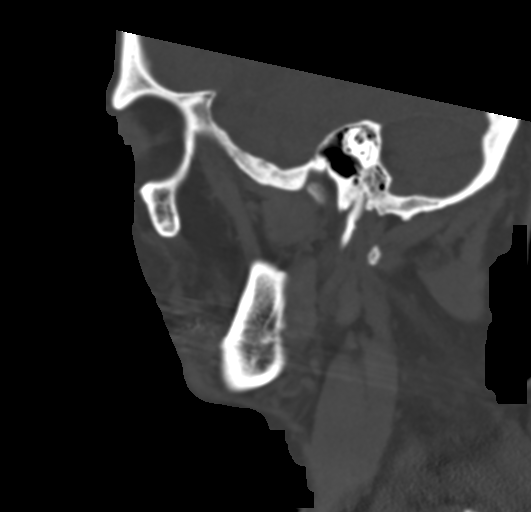

[14 of 47 positions shown; findings below may reference images not displayed]

FINDINGS: CT HEAD FINDINGS

Brain: Mild chronic ischemic white matter disease is noted. Old
right frontal infarction is noted. Probable small focus of
subarachnoid hemorrhage seen in the superior portion of the right
parietal cortex. Ventricular size is within normal limits. No mass
effect or midline shift is noted. No acute infarction or mass lesion
is noted.

Vascular: No hyperdense vessel or unexpected calcification.

Skull: Normal. Negative for fracture or focal lesion.

Other: None.

CT MAXILLOFACIAL FINDINGS

Osseous: No fracture or mandibular dislocation. No destructive
process.

Orbits: Negative. No traumatic or inflammatory finding.

Sinuses: Clear.

Soft tissues: Negative.

CT CERVICAL SPINE FINDINGS

Alignment: Normal.

Skull base and vertebrae: No acute fracture. No primary bone lesion
or focal pathologic process.

Soft tissues and spinal canal: No prevertebral fluid or swelling. No
visible canal hematoma.

Disc levels: Severe degenerative disc disease is noted at C3-4 and
C5-6. Mild degenerative disc disease is noted at C4-5 and C6-7.

Upper chest: Negative.

Other: None.
IMPRESSION: 1. Probable small focus of subarachnoid hemorrhage seen in the
superior portion of the right parietal cortex. Mild chronic ischemic
white matter disease. Old right frontal infarction. Critical
Value/emergent results were called by telephone at the time of
interpretation on [DATE] at [DATE] to provider Dr. INTAN, who
verbally acknowledged these results.
2. No significant abnormality seen in the maxillofacial region.
3. Multilevel degenerative disc disease. No acute abnormality seen
in the cervical spine.

## 2020-06-23 IMAGING — CT CT CERVICAL SPINE W/O CM
3 of 4 series · 9 of 35 positions shown, 11 images · non-contrast
Comparison: None.

CLINICAL DATA: Facial laceration after fall.

EXAM:
CT HEAD WITHOUT CONTRAST
CT MAXILLOFACIAL WITHOUT CONTRAST
CT CERVICAL SPINE WITHOUT CONTRAST
TECHNIQUE: Multidetector CT imaging of the head, cervical spine, and
maxillofacial structures were performed using the standard protocol
without intravenous contrast. Multiplanar CT image reconstructions
of the cervical spine and maxillofacial structures were also
generated.

[Series 5: coronal bone · coronal · 0.30mm/px · 3 of 60 slices shown]
[im 13/60  bone]
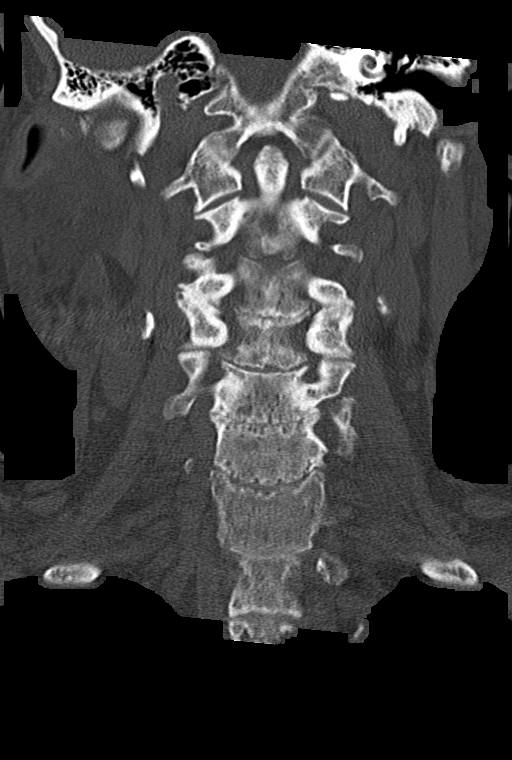
[im 24/60  bone]
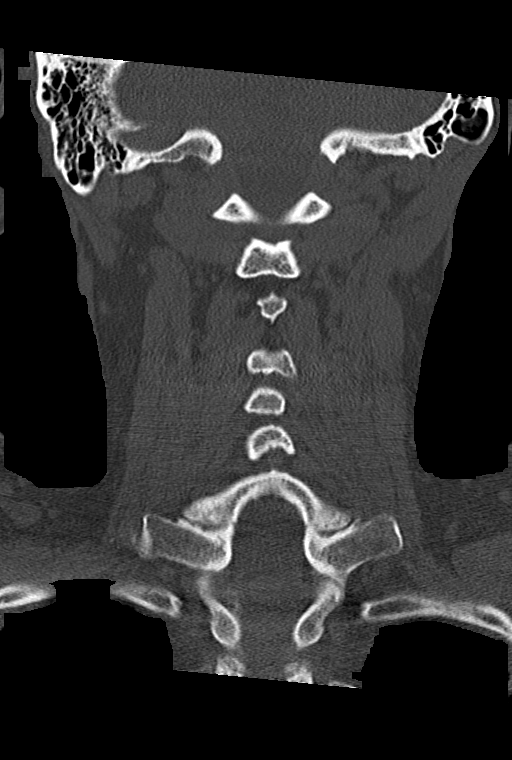
[im 36/60  bone]
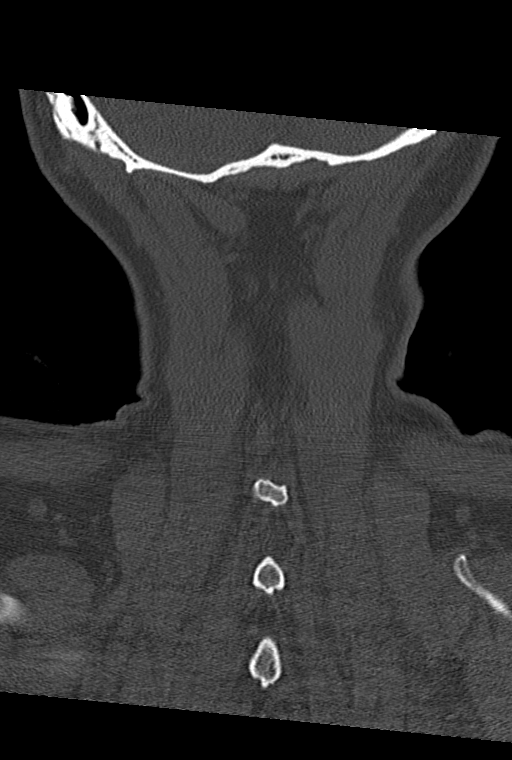

[Series 6: orthogonal axials · axial · 0.23mm/px · z∈[-142,-142]mm · 1 of 114 slices shown, 2 images]
[im 65/114  soft-tissue]
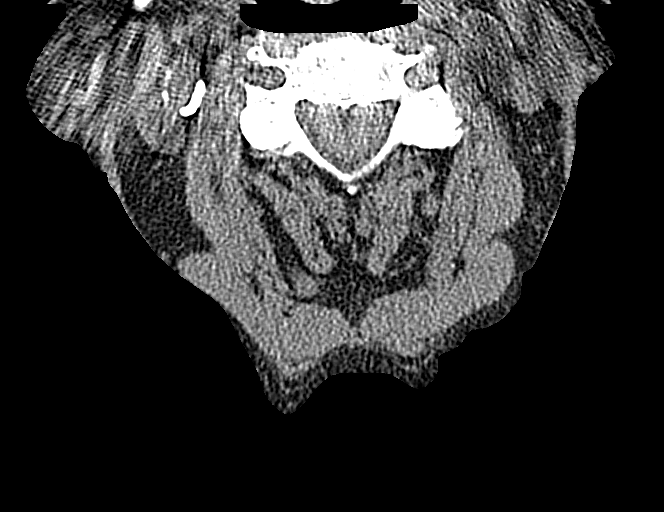
[im 65/114  bone]
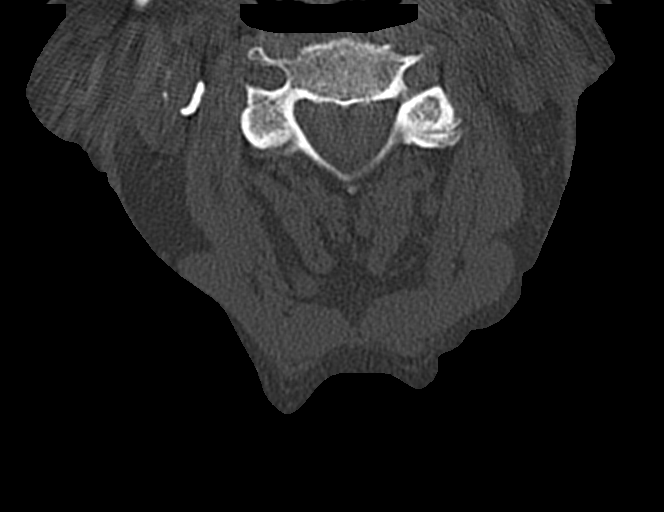

[Series 7: sag bone · sagittal · 0.23mm/px · 5 of 77 slices shown, 6 images]
[im 26/77  bone]
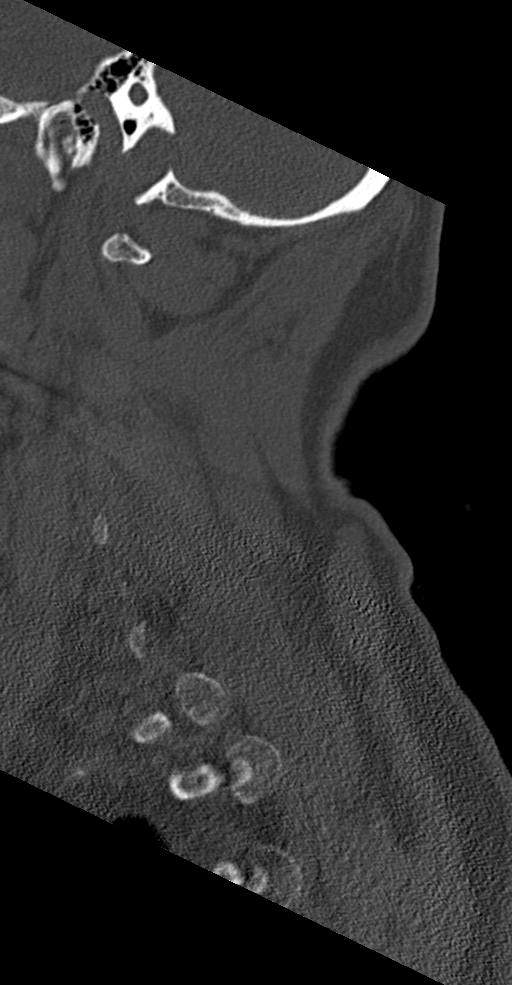
[im 32/77  bone]
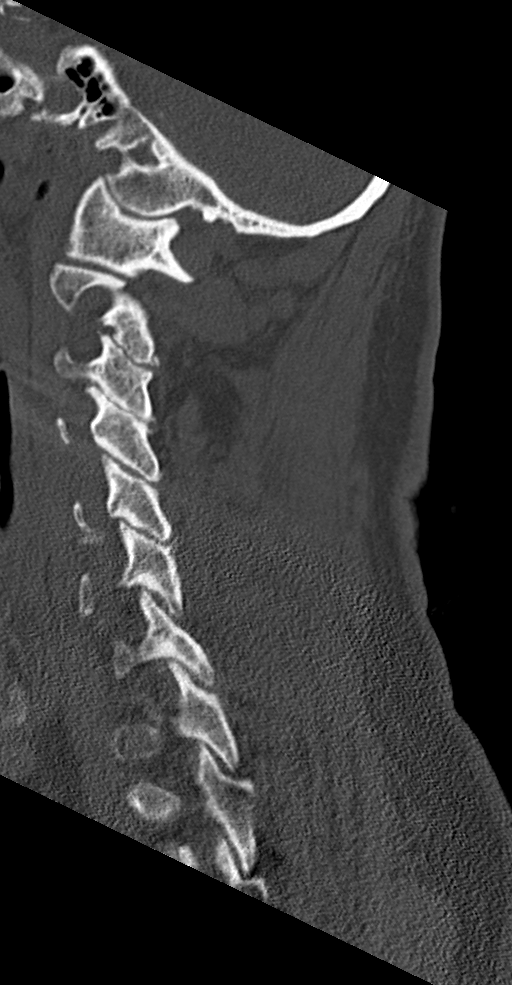
[im 39/77  soft-tissue]
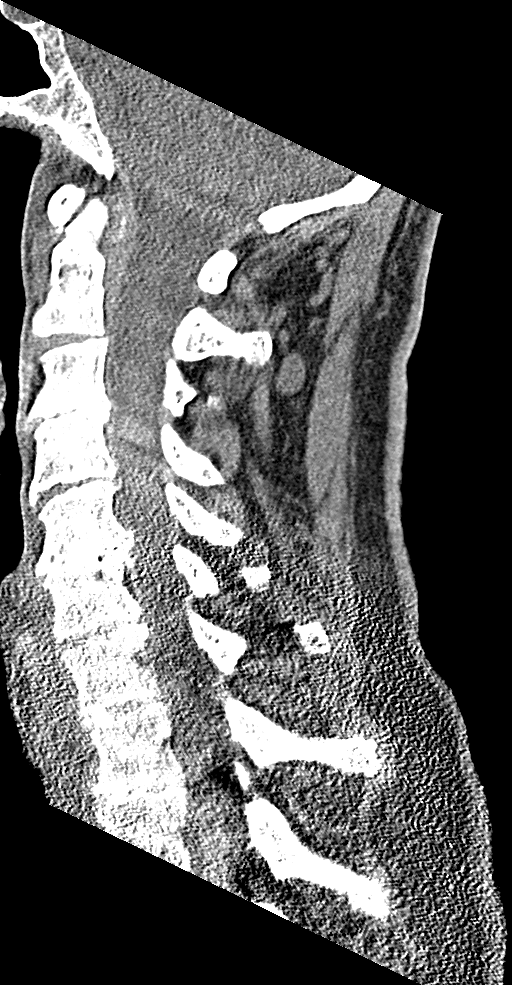
[im 39/77  bone]
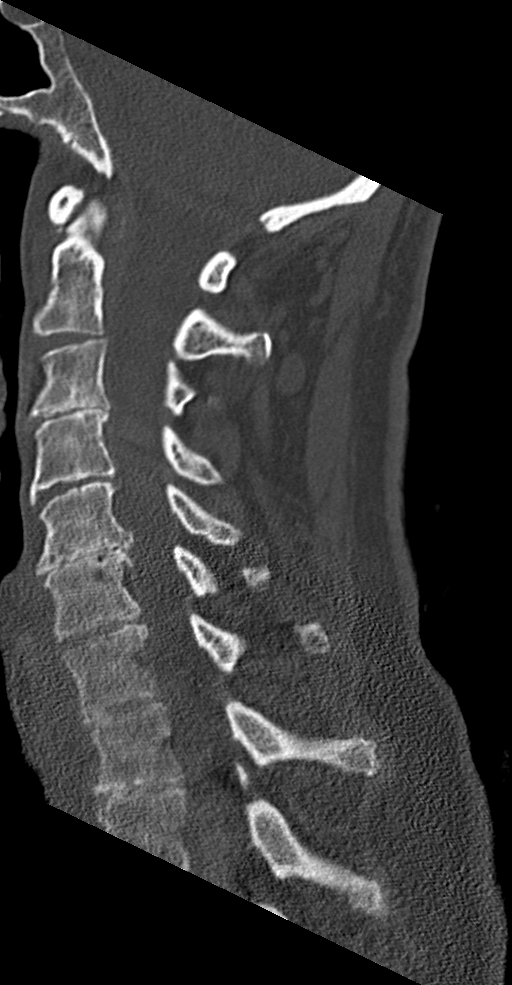
[im 45/77  bone]
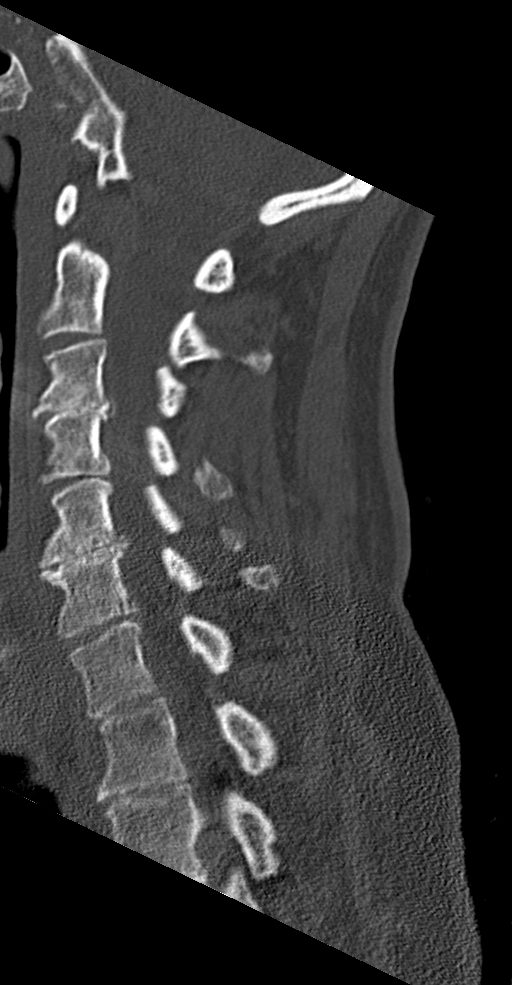
[im 51/77  bone]
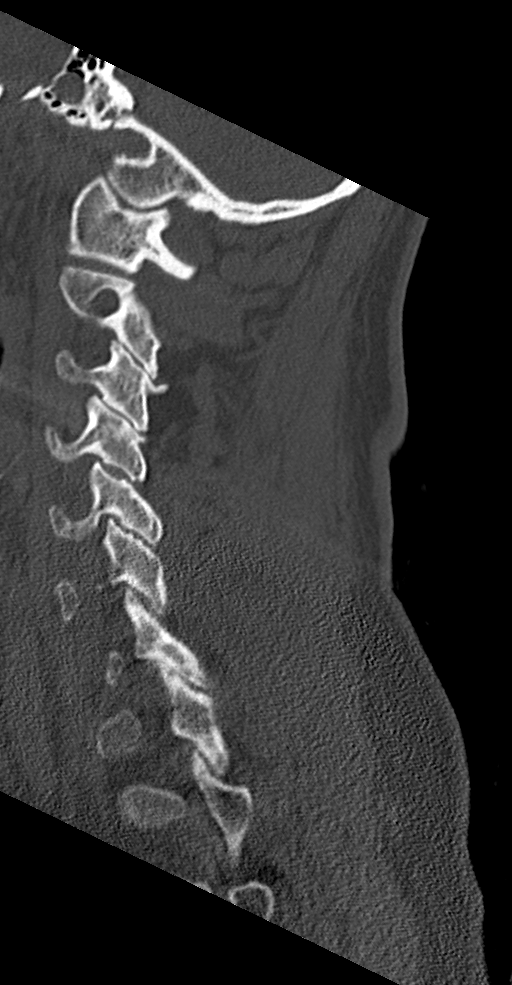

[9 of 35 positions shown; findings below may reference images not displayed]

FINDINGS: CT HEAD FINDINGS

Brain: Mild chronic ischemic white matter disease is noted. Old
right frontal infarction is noted. Probable small focus of
subarachnoid hemorrhage seen in the superior portion of the right
parietal cortex. Ventricular size is within normal limits. No mass
effect or midline shift is noted. No acute infarction or mass lesion
is noted.

Vascular: No hyperdense vessel or unexpected calcification.

Skull: Normal. Negative for fracture or focal lesion.

Other: None.

CT MAXILLOFACIAL FINDINGS

Osseous: No fracture or mandibular dislocation. No destructive
process.

Orbits: Negative. No traumatic or inflammatory finding.

Sinuses: Clear.

Soft tissues: Negative.

CT CERVICAL SPINE FINDINGS

Alignment: Normal.

Skull base and vertebrae: No acute fracture. No primary bone lesion
or focal pathologic process.

Soft tissues and spinal canal: No prevertebral fluid or swelling. No
visible canal hematoma.

Disc levels: Severe degenerative disc disease is noted at C3-4 and
C5-6. Mild degenerative disc disease is noted at C4-5 and C6-7.

Upper chest: Negative.

Other: None.
IMPRESSION: 1. Probable small focus of subarachnoid hemorrhage seen in the
superior portion of the right parietal cortex. Mild chronic ischemic
white matter disease. Old right frontal infarction. Critical
Value/emergent results were called by telephone at the time of
interpretation on [DATE] at [DATE] to provider Dr. INTAN, who
verbally acknowledged these results.
2. No significant abnormality seen in the maxillofacial region.
3. Multilevel degenerative disc disease. No acute abnormality seen
in the cervical spine.

## 2020-06-23 IMAGING — CT CT HEAD W/O CM
3 series · 15 of 47 positions shown, 18 images · non-contrast
Comparison: None.

CLINICAL DATA: Facial laceration after fall.

EXAM:
CT HEAD WITHOUT CONTRAST
CT MAXILLOFACIAL WITHOUT CONTRAST
CT CERVICAL SPINE WITHOUT CONTRAST
TECHNIQUE: Multidetector CT imaging of the head, cervical spine, and
maxillofacial structures were performed using the standard protocol
without intravenous contrast. Multiplanar CT image reconstructions
of the cervical spine and maxillofacial structures were also
generated.

[Series 2: head wo · axial · 0.40mm/px · z∈[-54,+76]mm · 9 of 32 slices shown, 12 images]
[im 3/32  brain]
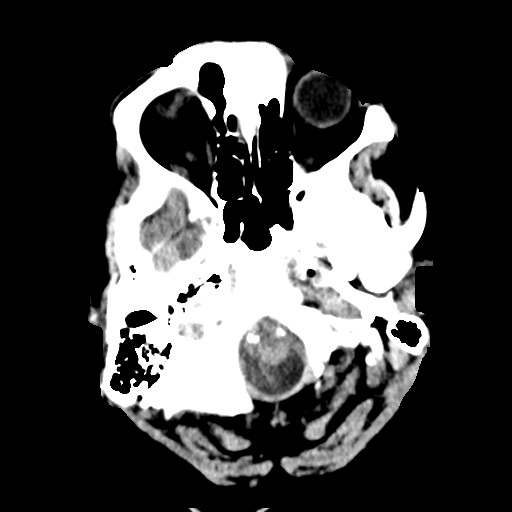
[im 3/32  bone]
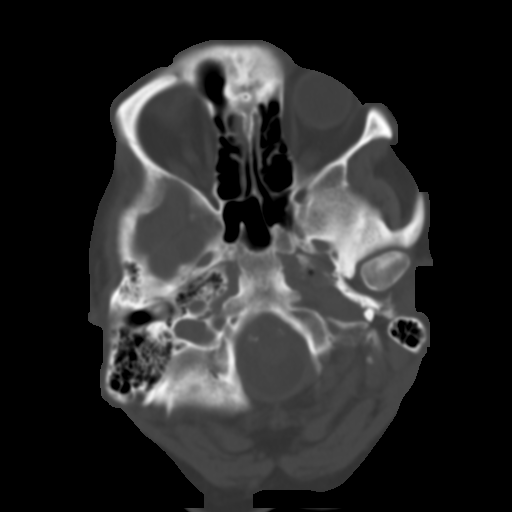
[im 6/32  brain]
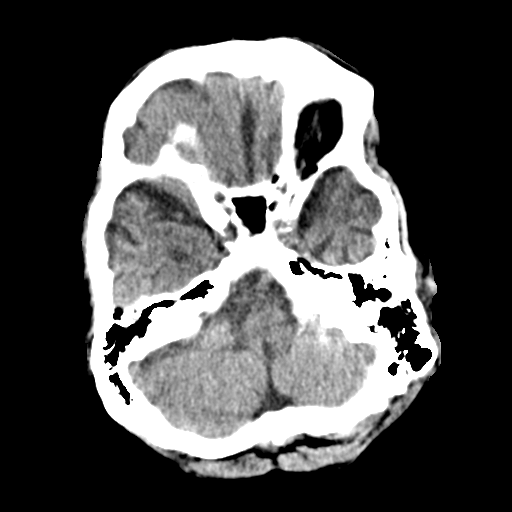
[im 9/32  brain]
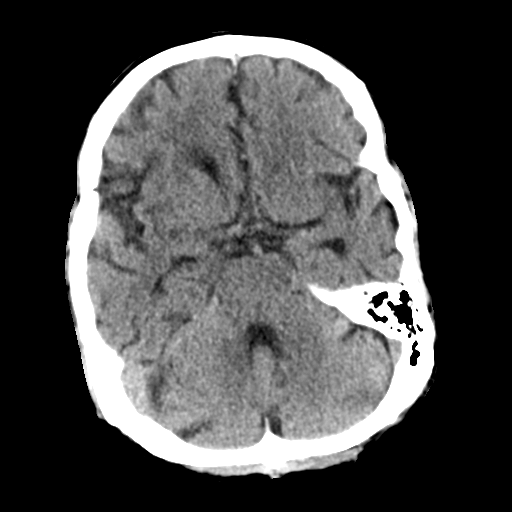
[im 12/32  brain]
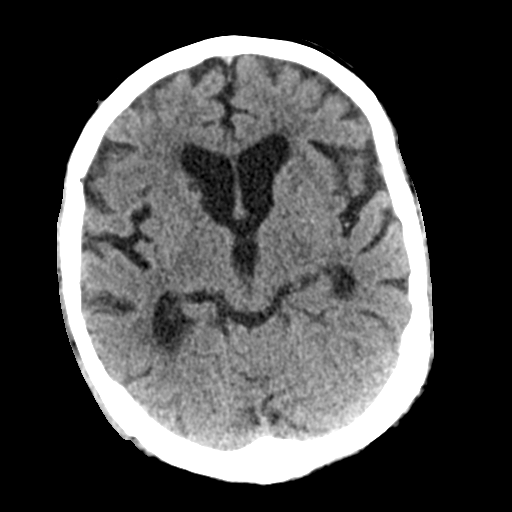
[im 17/32  brain]
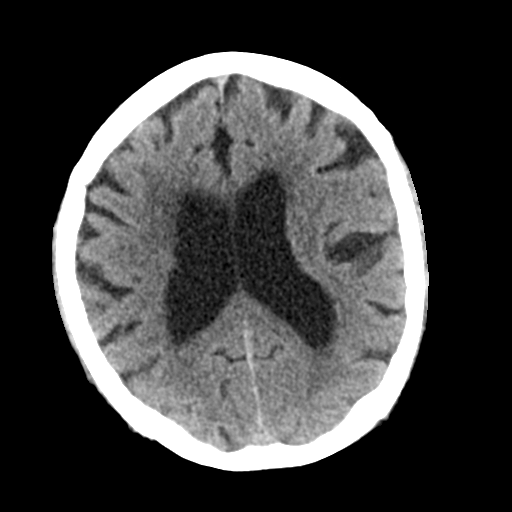
[im 17/32  bone]
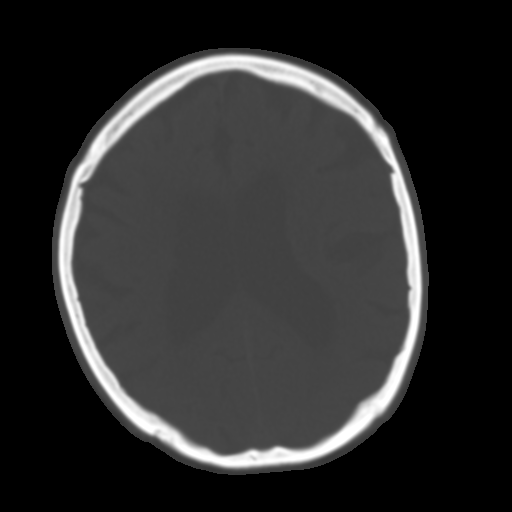
[im 20/32  brain]
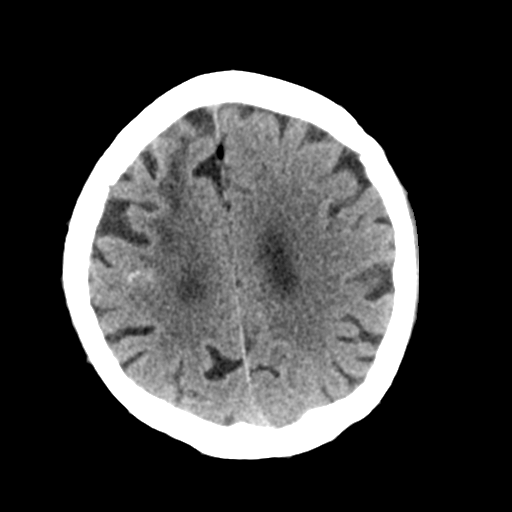
[im 23/32  brain]
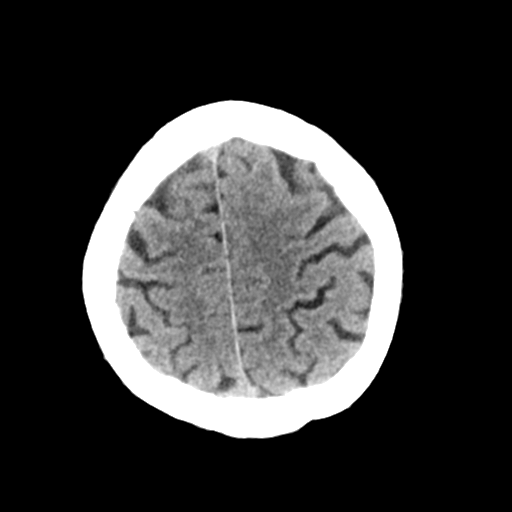
[im 26/32  brain]
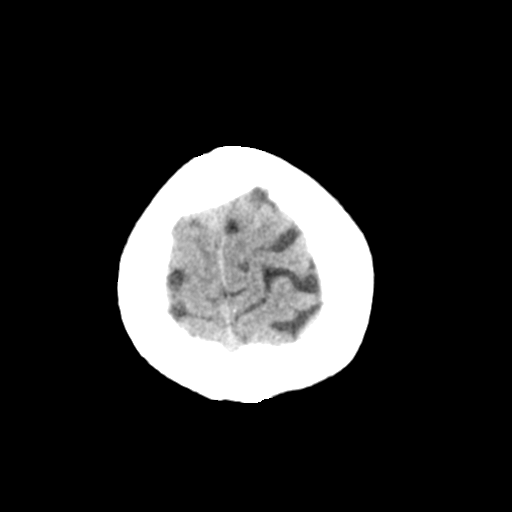
[im 29/32  brain]
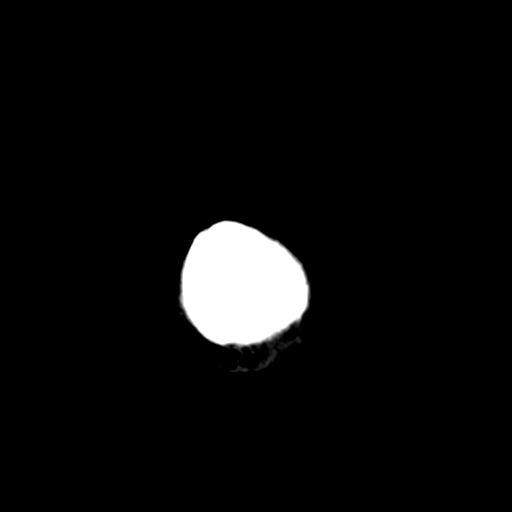
[im 29/32  bone]
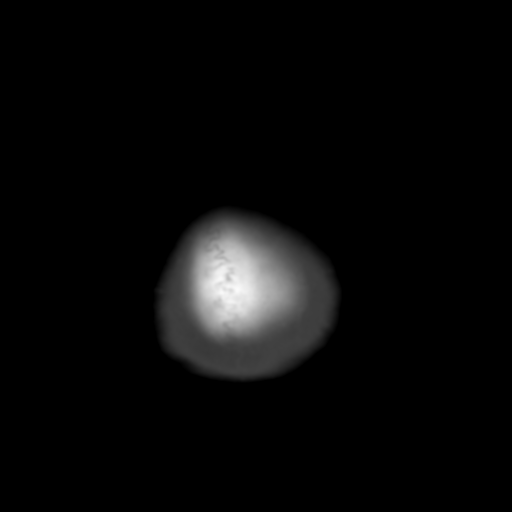

[Series 4: coronal soft tissue · coronal · 0.33mm/px · 3 of 64 slices shown]
[im 22/64  brain]
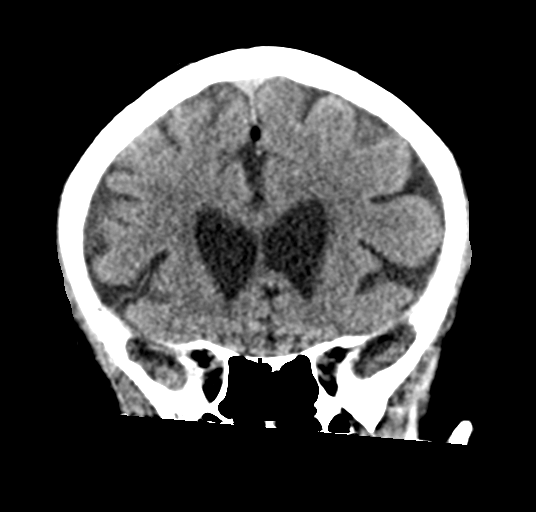
[im 29/64  brain]
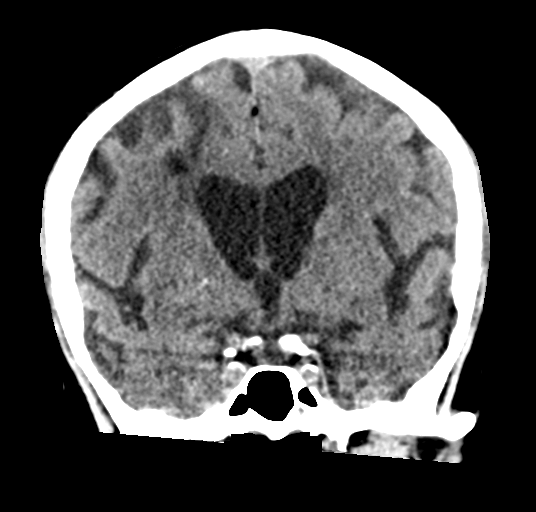
[im 36/64  brain]
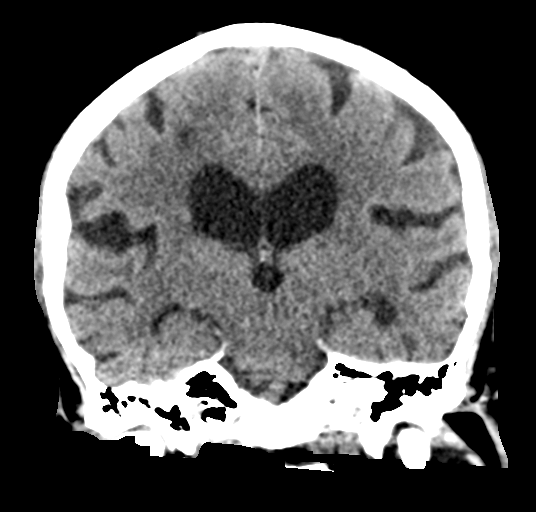

[Series 5: sagittal soft tissue · sagittal · 0.33mm/px · 3 of 60 slices shown]
[im 20/60  brain]
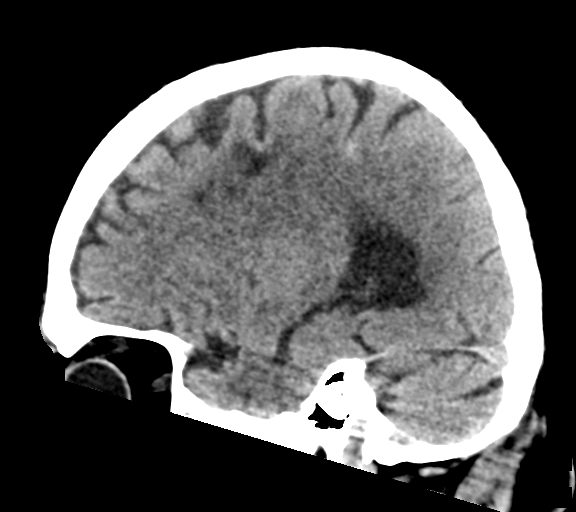
[im 30/60  brain]
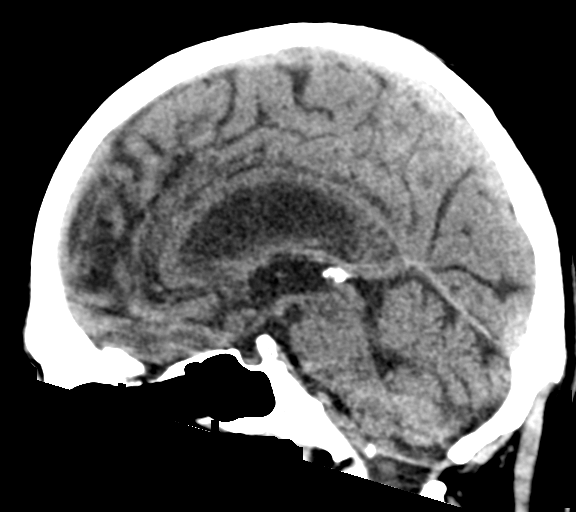
[im 40/60  brain]
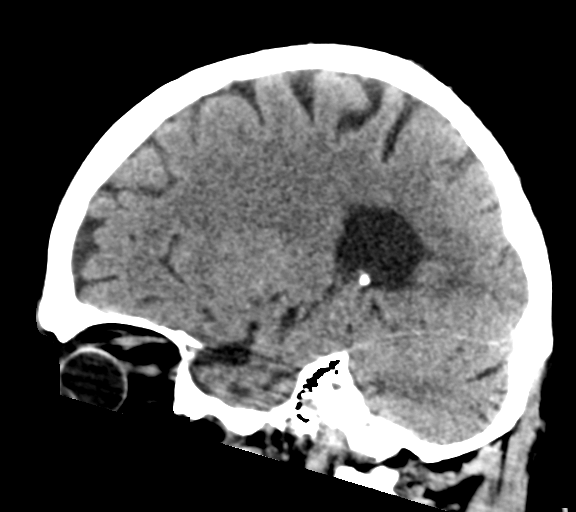

[15 of 47 positions shown; findings below may reference images not displayed]

FINDINGS: CT HEAD FINDINGS

Brain: Mild chronic ischemic white matter disease is noted. Old
right frontal infarction is noted. Probable small focus of
subarachnoid hemorrhage seen in the superior portion of the right
parietal cortex. Ventricular size is within normal limits. No mass
effect or midline shift is noted. No acute infarction or mass lesion
is noted.

Vascular: No hyperdense vessel or unexpected calcification.

Skull: Normal. Negative for fracture or focal lesion.

Other: None.

CT MAXILLOFACIAL FINDINGS

Osseous: No fracture or mandibular dislocation. No destructive
process.

Orbits: Negative. No traumatic or inflammatory finding.

Sinuses: Clear.

Soft tissues: Negative.

CT CERVICAL SPINE FINDINGS

Alignment: Normal.

Skull base and vertebrae: No acute fracture. No primary bone lesion
or focal pathologic process.

Soft tissues and spinal canal: No prevertebral fluid or swelling. No
visible canal hematoma.

Disc levels: Severe degenerative disc disease is noted at C3-4 and
C5-6. Mild degenerative disc disease is noted at C4-5 and C6-7.

Upper chest: Negative.

Other: None.
IMPRESSION: 1. Probable small focus of subarachnoid hemorrhage seen in the
superior portion of the right parietal cortex. Mild chronic ischemic
white matter disease. Old right frontal infarction. Critical
Value/emergent results were called by telephone at the time of
interpretation on [DATE] at [DATE] to provider Dr. INTAN, who
verbally acknowledged these results.
2. No significant abnormality seen in the maxillofacial region.
3. Multilevel degenerative disc disease. No acute abnormality seen
in the cervical spine.

## 2020-06-23 MED ORDER — TRAZODONE HCL 50 MG PO TABS: 25.0000 mg | ORAL_TABLET | Freq: Every evening | ORAL | Status: DC | PRN

## 2020-06-23 MED ORDER — ONDANSETRON HCL 4 MG/2ML IJ SOLN: 4.0000 mg | Freq: Four times a day (QID) | INTRAMUSCULAR | Status: DC | PRN

## 2020-06-23 MED ORDER — CYANOCOBALAMIN 500 MCG PO TABS
500.0000 ug | ORAL_TABLET | Freq: Every day | ORAL | Status: DC
  Administered 2020-06-24 – 2020-06-25 (×2): 500 ug via ORAL
  Filled 2020-06-23 (×3): qty 1

## 2020-06-23 MED ORDER — ALBUTEROL SULFATE (2.5 MG/3ML) 0.083% IN NEBU: 2.5000 mg | INHALATION_SOLUTION | RESPIRATORY_TRACT | Status: DC | PRN

## 2020-06-23 MED ORDER — ATORVASTATIN CALCIUM 20 MG PO TABS
40.0000 mg | ORAL_TABLET | Freq: Every day | ORAL | Status: DC
  Administered 2020-06-23 – 2020-06-24 (×2): 40 mg via ORAL
  Filled 2020-06-23 (×2): qty 2

## 2020-06-23 MED ORDER — OXYCODONE HCL 5 MG PO TABS: 5.0000 mg | ORAL_TABLET | ORAL | Status: DC | PRN

## 2020-06-23 MED ORDER — ACETAMINOPHEN 325 MG PO TABS: 650.0000 mg | ORAL_TABLET | Freq: Four times a day (QID) | ORAL | Status: DC | PRN

## 2020-06-23 MED ORDER — FINASTERIDE 5 MG PO TABS
5.0000 mg | ORAL_TABLET | Freq: Every day | ORAL | Status: DC
  Administered 2020-06-24 – 2020-06-25 (×2): 5 mg via ORAL
  Filled 2020-06-23 (×2): qty 1

## 2020-06-23 MED ORDER — ACETAMINOPHEN 650 MG RE SUPP: 650.0000 mg | Freq: Four times a day (QID) | RECTAL | Status: DC | PRN

## 2020-06-23 MED ORDER — HYDRALAZINE HCL 20 MG/ML IJ SOLN: 10.0000 mg | INTRAMUSCULAR | Status: DC | PRN

## 2020-06-23 MED ORDER — LEVETIRACETAM 500 MG PO TABS
500.0000 mg | ORAL_TABLET | Freq: Once | ORAL | Status: AC
  Administered 2020-06-23: 500 mg via ORAL
  Filled 2020-06-23: qty 1

## 2020-06-23 MED ORDER — ONDANSETRON HCL 4 MG PO TABS: 4.0000 mg | ORAL_TABLET | Freq: Four times a day (QID) | ORAL | Status: DC | PRN

## 2020-06-23 MED ORDER — FOLIC ACID 1 MG PO TABS
2.0000 mg | ORAL_TABLET | Freq: Every day | ORAL | Status: DC
  Administered 2020-06-24 – 2020-06-25 (×2): 2 mg via ORAL
  Filled 2020-06-23 (×2): qty 2

## 2020-06-23 MED ORDER — TAMSULOSIN HCL 0.4 MG PO CAPS
0.4000 mg | ORAL_CAPSULE | Freq: Every day | ORAL | Status: DC
  Administered 2020-06-23 – 2020-06-24 (×2): 0.4 mg via ORAL
  Filled 2020-06-23 (×2): qty 1

## 2020-06-23 MED ORDER — LEVETIRACETAM 500 MG PO TABS
500.0000 mg | ORAL_TABLET | Freq: Two times a day (BID) | ORAL | Status: DC
  Administered 2020-06-23 – 2020-06-25 (×4): 500 mg via ORAL
  Filled 2020-06-23 (×4): qty 1

## 2020-06-23 MED ORDER — AMLODIPINE BESYLATE 5 MG PO TABS
5.0000 mg | ORAL_TABLET | Freq: Every day | ORAL | Status: DC
  Administered 2020-06-24 – 2020-06-25 (×2): 5 mg via ORAL
  Filled 2020-06-23 (×2): qty 1

## 2020-06-23 MED ORDER — VITAMIN B-6 50 MG PO TABS
50.0000 mg | ORAL_TABLET | Freq: Every day | ORAL | Status: DC
  Administered 2020-06-24 – 2020-06-25 (×2): 50 mg via ORAL
  Filled 2020-06-23 (×3): qty 1

## 2020-06-23 NOTE — ED Notes (Signed)
Pt returned to room att, belongings gather for pt  OTF to floor att

## 2020-06-23 NOTE — ED Provider Notes (Signed)
-----------------------------------------   2:48 PM on 06/23/2020 -----------------------------------------  EKG viewed and interpreted by myself shows a normal sinus rhythm 88 bpm the slightly widened QRS, normal axis, largely normal intervals besides slight PR prolongation.  Nonspecific ST changes.   Minna Antis, MD 06/23/20 [phone]

## 2020-06-23 NOTE — ED Notes (Signed)
Pt in CT at this time.

## 2020-06-23 NOTE — ED Provider Notes (Signed)
[facility] Emergency Department Provider Note  ____________________________________________  Time seen: Approximately 2:45 PM  I have reviewed the triage vital signs and the nursing notes.   HISTORY  Chief Complaint Fall    HPI Edward Campbell is a 79 y.o. male who presents the emergency department complaining of jaw pain after a mechanical fall.  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  Patient states that he walks daily, was halfway through his 2 mile walk which he completes every day when his feet felt like "they were going faster than I wanted."  Patient states that he tried to slow down but lost his balance falling forward.  Patient struck the chin on the roadway.  He did not lose consciousness.  He did sustain a laceration to the chin.  No headache, visual changes, neck pain.  No extremity pain.  No chest pain, shortness of breath, abdominal pain, nausea vomiting.  Patient states that this was purely mechanical as his feet are going faster than his body can tolerate.  Patient is on blood thinners but bleeding was controlled direct pressure.  Up-to-date on tetanus.         Past Medical History:  Diagnosis Date  . Stroke [facility](HCC)     Patient Active Problem List   Diagnosis Date Noted  . Subarachnoid bleed (HCC) 06/23/2020  . HYPERCHOLESTEROLEMIA 12/04/2008  . 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 SUPERFICIAL PHLEBITIS 12/04/2008  . CHRONIC PROSTATITIS 12/04/2008  . PROSTATE SPECIFIC ANTIGEN, ELEVATED 12/04/2008    Past Surgical History:  Procedure Laterality Date  . BACK SURGERY      Prior to Admission medications   Medication Sig Start Date End Date Taking? Authorizing Provider  amLODipine (NORVASC) 5 MG tablet Take 5 mg by mouth daily. 03/21/20  Yes [provider]  aspirin 81 MG EC tablet Take 81 mg  by mouth daily at 6 (six) AM.   Yes [provider]  atorvastatin (LIPITOR) 40 MG tablet Take 40 mg by mouth at bedtime. 05/14/20  Yes [provider]  ELIQUIS 5 MG TABS tablet Take 5 mg by mouth every 12 (twelve) hours. 06/12/20  Yes [provider]  finasteride (PROSCAR) 5 MG tablet Take 5 mg by mouth daily. 05/28/20  Yes [provider]  folic acid (FOLVITE) 1 MG tablet Take 2 mg by mouth daily at 6 (six) AM. 05/14/20 05/14/21 Yes [provider]  pyridOXINE (B-6) 50 MG tablet Take 50 mg by mouth daily at 6 (six) AM. 04/16/20 04/16/21 Yes [provider]  tamsulosin (FLOMAX) 0.4 MG CAPS capsule Take 0.4 mg by mouth daily. 05/03/20  Yes [provider]  vitamin B-12 (CYANOCOBALAMIN) 500 MCG tablet Take 500 mcg by mouth daily at 6 (six) AM. 04/17/20 04/17/21 Yes [provider]  Dextran 70-Hypromellose 0.1-0.3 % SOLN Apply 1 drop to eye every 6 (six) hours as needed for dry eyes.    [provider]    Allergies Albumin human  History reviewed. No pertinent family history.  Social History Social History   Tobacco Use  . Smoking status: Never Smoker  Substance Use Topics  . Alcohol use: Yes    Comment: wine   . Drug use: Not on file     Review of Systems  Constitutional: No fever/chills Eyes: No visual changes. No discharge ENT: No upper respiratory complaints.  Cardiovascular: no chest pain. Respiratory: no cough. No SOB. Gastrointestinal: No abdominal pain.  No nausea, no vomiting.  No diarrhea.  No constipation. Musculoskeletal: Positive for chin laceration and right TMJ pain following a fall. Skin: Negative for rash, abrasions, lacerations, ecchymosis. Neurological: Negative for headaches, focal weakness or numbness. 10-point ROS otherwise negative.  ____________________________________________   PHYSICAL EXAM:  VITAL SIGNS: ED Triage Vitals  Enc Vitals Group     BP 06/23/20 1416 130/72     Pulse  Rate 06/23/20 1416 91     Resp 06/23/20 1416 16     Temp 06/23/20 1416 98.2 F (36.8 C)     Temp Source 06/23/20 1416 Oral     SpO2 06/23/20 1416 93 %     Weight 06/23/20 1418 163 lb (73.9 kg)     Height 06/23/20 1418 5\' 8"  (1.727 m)     Head Circumference --      Peak Flow --      Pain Score 06/23/20 1418 0     Pain Loc --      Pain Edu? --      Excl. in GC? --      Constitutional: Alert and oriented. Well appearing and in no acute distress. Eyes: Conjunctivae are normal. PERRL. EOMI. Head: Superficial laceration under the chin.  This measures approximately 1 cm in length.  Edges are well approximated.  No bleeding.  No visible foreign body.  Nontender to palpation along the submental region where the laceration is.  Patient is very tender to palpation along the right TMJ with no palpable abnormality.  Full range of motion to the mandible at this time.  Patient has dried blood noted along his neck, face but no other visible signs of soft tissue injury.  No tenderness to palpation of the osseous structures of the skull.  No battle signs, raccoon eyes, serosanguineous fluid drainage from the ears or nares. ENT:      Ears:       Nose: No congestion/rhinnorhea.      Mouth/Throat: Mucous membranes are moist.  Neck: No stridor.  No cervical spine tenderness to palpation  Cardiovascular: Normal rate, regular rhythm. Normal S1 and S2.  Good peripheral circulation. Respiratory: Normal respiratory effort without tachypnea or retractions. Lungs CTAB. Good air entry to the bases with no decreased or absent breath sounds. Musculoskeletal: Full range of motion to all extremities. No gross deformities appreciated. Neurologic:  Normal speech and language. No gross focal neurologic deficits are appreciated.  Cranial nerves II through XII grossly intact. Skin:  Skin is warm, dry and intact. No rash noted. Psychiatric: Mood and affect are normal. Speech and behavior are normal. Patient exhibits  appropriate insight and judgement.   ____________________________________________   LABS (all labs ordered are listed, but only abnormal results are displayed)  Labs Reviewed  BASIC METABOLIC PANEL - Abnormal; Notable for the following components:      Result Value   Glucose, Bld 122 (*)    Creatinine, Ser 1.30 (*)    Calcium 8.5 (*)    GFR calc non Af Amer 52 (*)    GFR calc Af Amer 60 (*)    All other components within normal limits  CBC - Abnormal; Notable for the following components:   RBC 3.85 (*)    Hemoglobin 12.3 (*)    HCT 35.4 (*)    All other components within normal limits  PROTIME-INR - Abnormal; Notable for the following components:   Prothrombin Time 16.0 (*)  INR 1.3 (*)    All other components within normal limits  SARS CORONAVIRUS 2 BY RT PCR (HOSPITAL ORDER, PERFORMED IN [facility])  APTT  BASIC METABOLIC PANEL  CBC  PROTIME-INR  APTT   ____________________________________________  EKG   ____________________________________________  RADIOLOGY I personally viewed and evaluated these images as part of my medical decision making, as well as reviewing the written report by the radiologist.  CT Head Wo Contrast  Result Date: 06/23/2020 CLINICAL DATA:  Facial laceration after fall. EXAM: CT HEAD WITHOUT CONTRAST CT MAXILLOFACIAL WITHOUT CONTRAST CT CERVICAL SPINE WITHOUT CONTRAST TECHNIQUE: Multidetector CT imaging of the head, cervical spine, and maxillofacial structures were performed using the standard protocol without intravenous contrast. Multiplanar CT image reconstructions of the cervical spine and maxillofacial structures were also generated. COMPARISON:  None. FINDINGS: CT HEAD FINDINGS Brain: Mild chronic ischemic white matter disease is noted. Old right frontal infarction is noted. Probable small focus of subarachnoid hemorrhage seen in the superior portion of the right parietal cortex. Ventricular size is within normal limits. No  mass effect or midline shift is noted. No acute infarction or mass lesion is noted. Vascular: No hyperdense vessel or unexpected calcification. Skull: Normal. Negative for fracture or focal lesion. Other: None. CT MAXILLOFACIAL FINDINGS Osseous: No fracture or mandibular dislocation. No destructive process. Orbits: Negative. No traumatic or inflammatory finding. Sinuses: Clear. Soft tissues: Negative. CT CERVICAL SPINE FINDINGS Alignment: Normal. Skull base and vertebrae: No acute fracture. No primary bone lesion or focal pathologic process. Soft tissues and spinal canal: No prevertebral fluid or swelling. No visible canal hematoma. Disc levels: Severe degenerative disc disease is noted at C3-4 and C5-6. Mild degenerative disc disease is noted at C4-5 and C6-7. Upper chest: Negative. Other: None. IMPRESSION: 1. Probable small focus of subarachnoid hemorrhage seen in the superior portion of the right parietal cortex. Mild chronic ischemic white matter disease. Old right frontal infarction. Critical Value/emergent results were called by telephone at the time of interpretation on 06/23/2020 at 4:02 pm to provider Dr. Scotty Court, who verbally acknowledged these results. 2. No significant abnormality seen in the maxillofacial region. 3. Multilevel degenerative disc disease. No acute abnormality seen in the cervical spine. Electronically Signed   By: Lupita Raider M.D.   On: 06/23/2020 16:02   CT Cervical Spine Wo Contrast  Result Date: 06/23/2020 CLINICAL DATA:  Facial laceration after fall. EXAM: CT HEAD WITHOUT CONTRAST CT MAXILLOFACIAL WITHOUT CONTRAST CT CERVICAL SPINE WITHOUT CONTRAST TECHNIQUE: Multidetector CT imaging of the head, cervical spine, and maxillofacial structures were performed using the standard protocol without intravenous contrast. Multiplanar CT image reconstructions of the cervical spine and maxillofacial structures were also generated. COMPARISON:  None. FINDINGS: CT HEAD FINDINGS Brain: Mild  chronic ischemic white matter disease is noted. Old right frontal infarction is noted. Probable small focus of subarachnoid hemorrhage seen in the superior portion of the right parietal cortex. Ventricular size is within normal limits. No mass effect or midline shift is noted. No acute infarction or mass lesion is noted. Vascular: No hyperdense vessel or unexpected calcification. Skull: Normal. Negative for fracture or focal lesion. Other: None. CT MAXILLOFACIAL FINDINGS Osseous: No fracture or mandibular dislocation. No destructive process. Orbits: Negative. No traumatic or inflammatory finding. Sinuses: Clear. Soft tissues: Negative. CT CERVICAL SPINE FINDINGS Alignment: Normal. Skull base and vertebrae: No acute fracture. No primary bone lesion or focal pathologic process. Soft tissues and spinal canal: No prevertebral fluid or swelling. No visible canal hematoma. Disc levels: Severe  degenerative disc disease is noted at C3-4 and C5-6. Mild degenerative disc disease is noted at C4-5 and C6-7. Upper chest: Negative. Other: None. IMPRESSION: 1. Probable small focus of subarachnoid hemorrhage seen in the superior portion of the right parietal cortex. Mild chronic ischemic white matter disease. Old right frontal infarction. Critical Value/emergent results were called by telephone at the time of interpretation on 06/23/2020 at 4:02 pm to provider Dr. Scotty Court, who verbally acknowledged these results. 2. No significant abnormality seen in the maxillofacial region. 3. Multilevel degenerative disc disease. No acute abnormality seen in the cervical spine. Electronically Signed   By: Lupita Raider M.D.   On: 06/23/2020 16:02   CT Maxillofacial Wo Contrast  Result Date: 06/23/2020 CLINICAL DATA:  Facial laceration after fall. EXAM: CT HEAD WITHOUT CONTRAST CT MAXILLOFACIAL WITHOUT CONTRAST CT CERVICAL SPINE WITHOUT CONTRAST TECHNIQUE: Multidetector CT imaging of the head, cervical spine, and maxillofacial structures  were performed using the standard protocol without intravenous contrast. Multiplanar CT image reconstructions of the cervical spine and maxillofacial structures were also generated. COMPARISON:  None. FINDINGS: CT HEAD FINDINGS Brain: Mild chronic ischemic white matter disease is noted. Old right frontal infarction is noted. Probable small focus of subarachnoid hemorrhage seen in the superior portion of the right parietal cortex. Ventricular size is within normal limits. No mass effect or midline shift is noted. No acute infarction or mass lesion is noted. Vascular: No hyperdense vessel or unexpected calcification. Skull: Normal. Negative for fracture or focal lesion. Other: None. CT MAXILLOFACIAL FINDINGS Osseous: No fracture or mandibular dislocation. No destructive process. Orbits: Negative. No traumatic or inflammatory finding. Sinuses: Clear. Soft tissues: Negative. CT CERVICAL SPINE FINDINGS Alignment: Normal. Skull base and vertebrae: No acute fracture. No primary bone lesion or focal pathologic process. Soft tissues and spinal canal: No prevertebral fluid or swelling. No visible canal hematoma. Disc levels: Severe degenerative disc disease is noted at C3-4 and C5-6. Mild degenerative disc disease is noted at C4-5 and C6-7. Upper chest: Negative. Other: None. IMPRESSION: 1. Probable small focus of subarachnoid hemorrhage seen in the superior portion of the right parietal cortex. Mild chronic ischemic white matter disease. Old right frontal infarction. Critical Value/emergent results were called by telephone at the time of interpretation on 06/23/2020 at 4:02 pm to provider Dr. Scotty Court, who verbally acknowledged these results. 2. No significant abnormality seen in the maxillofacial region. 3. Multilevel degenerative disc disease. No acute abnormality seen in the cervical spine. Electronically Signed   By: Lupita Raider M.D.   On: 06/23/2020 16:02     ____________________________________________    PROCEDURES  Procedure(s) performed:    Marland KitchenMarland KitchenLaceration Repair  Date/Time: 06/23/2020 7:03 PM Performed by: Racheal Patches, PA-C Authorized by: Racheal Patches, PA-C   Consent:    Consent obtained:  Verbal   Consent given by:  Patient   Risks discussed:  Pain, poor wound healing and infection Anesthesia (see MAR for exact dosages):    Anesthesia method:  None Laceration details:    Location:  Face   Face location:  Chin   Length (cm):  1 Repair type:    Repair type:  Simple Pre-procedure details:    Preparation:  Patient was prepped and draped in usual sterile fashion and imaging obtained to evaluate for foreign bodies Exploration:    Hemostasis achieved with:  Direct pressure   Wound exploration: wound explored through full range of motion and entire depth of wound probed and visualized     Wound extent: no  foreign bodies/material noted, no muscle damage noted, no nerve damage noted, no tendon damage noted, no underlying fracture noted and no vascular damage noted     Contaminated: no   Treatment:    Area cleansed with:  Saline and Shur-Clens   Amount of cleaning:  Standard   Irrigation solution:  Sterile saline   Irrigation volume:  250 ml   Irrigation method:  Syringe Skin repair:    Repair method:  Tissue adhesive Approximation:    Approximation:  Close Post-procedure details:    Dressing:  Open (no dressing)   Patient tolerance of procedure:  Tolerated well, no immediate complications .Critical Care Performed by: Racheal Patches, PA-C Authorized by: Racheal Patches, PA-C   Critical care provider statement:    Critical care time (minutes):  25   Critical care was necessary to treat or prevent imminent or life-threatening deterioration of the following conditions:  Trauma and CNS failure or compromise   Critical care was time spent personally by me on the following activities:  Obtaining  history from patient or surrogate, examination of patient, evaluation of patient's response to treatment, discussions with consultants, development of treatment plan with patient or surrogate, ordering and performing treatments and interventions, ordering and review of radiographic studies, re-evaluation of patient's condition and review of old charts Comments:     Patient presents emergency department after falling, striking his face.  Patient sustained laceration to the chin.  2 recent CVAs.  Imaging revealed that patient has subarachnoid hemorrhage.  Discussed the patient with [facility] neurosurgery who provides recommendations regarding patient's care.  No direct interventions deemed necessary unless changes are noted on follow-up CT.  Patient will be placed on Keppra 500 twice daily for 1 week.  Recommendations are to hold anticoagulation for 1 week.  Manage blood pressure as needed.      Medications  amLODipine (NORVASC) tablet 5 mg (has no administration in time range)  atorvastatin (LIPITOR) tablet 40 mg (has no administration in time range)  finasteride (PROSCAR) tablet 5 mg (has no administration in time range)  tamsulosin (FLOMAX) capsule 0.4 mg (has no administration in time range)  folic acid (FOLVITE) tablet 2 mg (has no administration in time range)  vitamin B-12 (CYANOCOBALAMIN) tablet 500 mcg (has no administration in time range)  pyridOXINE (VITAMIN B-6) tablet 50 mg (has no administration in time range)  acetaminophen (TYLENOL) tablet 650 mg (has no administration in time range)    Or  acetaminophen (TYLENOL) suppository 650 mg (has no administration in time range)  oxyCODONE (Oxy IR/ROXICODONE) immediate release tablet 5 mg (has no administration in time range)  traZODone (DESYREL) tablet 25 mg (has no administration in time range)  ondansetron (ZOFRAN) tablet 4 mg (has no administration in time range)    Or  ondansetron (ZOFRAN) injection 4 mg (has no administration in time  range)  albuterol (PROVENTIL) (2.5 MG/3ML) 0.083% nebulizer solution 2.5 mg (has no administration in time range)  hydrALAZINE (APRESOLINE) injection 10 mg (has no administration in time range)  levETIRAcetam (KEPPRA) tablet 500 mg (has no administration in time range)  levETIRAcetam (KEPPRA) tablet 500 mg (500 mg Oral Given 06/23/20 1751)     ____________________________________________   INITIAL IMPRESSION / ASSESSMENT AND PLAN / ED COURSE  Pertinent labs & imaging results that were available during my care of the patient were reviewed by me and considered in my medical decision making (see chart for details).  Review of the [place] CSRS was performed in accordance of the  [facility] prior to dispensing any controlled drugs.  Clinical Course as of Jun 23 1902  Sat Jun 23, 2020  1741 I spoke with [facility] regarding this patient.  Given the patient's history, findings today on CT scan they recommend holding anticoagulation x7 days.  They also recommend Keppra 500 twice daily x7 days.  Recommend observing patient, repeat imaging in 6 to 8 hours.  Recommend keeping patient normotensive but not over aggressive with antihypertensives.   [JC]    Clinical Course User Index [JC] Aija Scarfo, [name], PA-C          Patient's diagnosis is consistent with fall, chin laceration, subarachnoid hemorrhage.  Patient presented to the emergency department after sustaining a mechanical fall.  Patient states that he had 2 strokes in the month of April after receiving his Covid vaccine.  Patient had Covid vaccine, stroke 2 days later.  On his next Covid vaccine he also had a CVA 2 days later.  Patient was on Eliquis.  He has not been cleared for driving and so he walks to the store, post office, and daily to stay in good shape.  Patient was walking today, states that his feet "got going faster than I could keep up, and I fell."  Patient did injure his chin with a superficial laceration that was easily repaired  with Dermabond..  Patient had right TMJ pain but was otherwise neurologically intact.  CT scan revealed subarachnoid hemorrhage but no other acute findings.  Given this finding I discussed the patient with [facility] as there is no neurosurgery coverage today for [place].  [facility] advised that we should hold anticoagulation for 7 days, administer Keppra 500 twice daily for 7 days.  They advised to provide antihypertensives if necessary however patient has been reassuring blood pressures at this time.  First dose of Keppra is administered in the emergency department.  They also advised to follow-up imaging in 6 to 8 hours to ensure no changes or worsening of subarachnoid hemorrhage.  Patient remains neurologically intact.  I discussed the patient with the hospitalist service who agrees to admit the patient for observation.  Patient care will be transferred to the hospital service at this time.  On discharge patient is to follow-up with his neurologist    ____________________________________________  FINAL CLINICAL IMPRESSION(S) / ED DIAGNOSES  Final diagnoses:  Subarachnoid hemorrhage (HCC)  Fall, initial encounter  Chin laceration, initial encounter      NEW MEDICATIONS STARTED DURING THIS VISIT:  ED Discharge Orders    None          This chart was dictated using voice recognition software/Dragon. Despite best efforts to proofread, errors can occur which can change the meaning. Any change was purely unintentional.    Racheal Patches, PA-C 06/23/20 2018    Minna Antis, MD 06/25/20 2048

## 2020-06-23 NOTE — ED Notes (Signed)
Pt was given a meal tray per day shift RN Jae Dire, pt ate 100% of his meal.

## 2020-06-23 NOTE — ED Notes (Signed)
Pt assisted to toilet to urinate 

## 2020-06-23 NOTE — ED Notes (Signed)
Floor called, Edward Campbell, pt on the way after CT completion

## 2020-06-23 NOTE — ED Notes (Signed)
Pt taken to CT at this time.

## 2020-06-23 NOTE — ED Triage Notes (Signed)
Pt arrives to ER via [facility] for a fall. Was walking outside and stated to EMS he began walking too fast and fell. Lac to chin. On eliquis from previous strokes. Denies LOC. Arrives [facility].

## 2020-06-23 NOTE — H&P (Signed)
History and Physical    MACALLAN ORD ZOX:[id number] DOB: 03/21/1941 DOA: 06/23/2020  PCP: Dione Housekeeper, MD  Patient coming from: Home  I have personally briefly reviewed patient's old medical records in [facility]  Chief Complaint: Fall, subarachoid hemorrhage  HPI: Edward Campbell is a 79 y.o. male with medical history significant of 2 recent CVAs, hypertension, hyperlipidemia who presents for evaluation after a fall and subsequent laceration to the chin.  The patient states that he was on a 2 mile walk when he fell.  The patient does have left-sided weakness from prior stroke and is post to be using a cane however apparently he was not doing so.  Patient denies syncope and states mechanical fall.  Approximately 1.5 cm laceration to the base of the chin.  Hemostatic.  Patient has an interesting medical history.  Patient has had 2 prior strokes.  Both strokes were 3 days after the first and second Covid shots.  Here he has a Covid negative.  The connection between the strokes and the Covid vaccine is unclear.  After the second stroke the patient was placed on therapeutic anticoagulation with aspirin and Eliquis.  On presentation to the ED laceration was addressed.  The patient given his prior history of strokes and the presence of anticoagulation had a CT imaging survey performed.  CT imaging survey significant for a small subarachnoid hemorrhage in the superior portion of the right parietal cortex.  No other significant findings on CT imaging survey.  My evaluation patient is comfortable stable no acute distress.  He is not complaining of pain.  He is alert and oriented x3.  No focal neurologic deficits.  Patient will be placed in observation status and monitor overnight.   ED Course: Vital signs stable.  Patient was given 500 Keppra after discussion with [facility] over phone.  Per [facility] recommend observation.  Repeat CAT scan in 6 to 8 hours from initial.   Initiate 500 Keppra twice daily x7 days.  Hold anticoagulation.  Reevaluate in the morning.  Review of Systems: As per HPI otherwise 10 point review of systems negative.    Past Medical History:  Diagnosis Date  . Stroke  [facility])     Past Surgical History:  Procedure Laterality Date  . BACK SURGERY       reports that he has never smoked. He does not have any smokeless tobacco history on file. He reports current alcohol use. No history on file for drug use.  Allergies  Allergen Reactions  . Albumin Human Other (See Comments)    Refuses all blood products as one of [employer]es    History reviewed. No pertinent family history.   Prior to Admission medications   Medication Sig Start Date End Date Taking? Authorizing Provider  amLODipine (NORVASC) 5 MG tablet Take 5 mg by mouth daily. 03/21/20  Yes [provider]  aspirin 81 MG EC tablet Take 81 mg by mouth daily at 6 (six) AM.   Yes [provider]  atorvastatin (LIPITOR) 40 MG tablet Take 40 mg by mouth at bedtime. 05/14/20  Yes [provider]  ELIQUIS 5 MG TABS tablet Take 5 mg by mouth every 12 (twelve) hours. 06/12/20  Yes [provider]  finasteride (PROSCAR) 5 MG tablet Take 5 mg by mouth daily. 05/28/20  Yes [provider]  folic acid (FOLVITE) 1 MG tablet Take 2 mg by mouth daily at 6 (six) AM. 05/14/20 05/14/21 Yes [provider]  pyridOXINE (B-6) 50 MG tablet Take 50 mg by mouth daily at 6 (six) AM. 04/16/20 04/16/21 Yes [provider]  tamsulosin (FLOMAX) 0.4 MG CAPS capsule Take 0.4 mg by mouth daily. 05/03/20  Yes [provider]  vitamin B-12 (CYANOCOBALAMIN) 500 MCG tablet Take 500 mcg by mouth daily at 6 (six) AM. 04/17/20 04/17/21 Yes [provider]  Dextran 70-Hypromellose 0.1-0.3 % SOLN Apply 1 drop to eye every 6 (six) hours as needed for dry eyes.    [provider]    Physical Exam: Vitals:   06/23/20 1530 06/23/20 1600  06/23/20 1630 06/23/20 1730  BP: 127/83 (!) 141/75 (!) 151/77 (!) 154/104  Pulse: 78 77 75 85  Resp: (!) 22 (!) 22 16 18   Temp:      TempSrc:      SpO2: 95% 96% 97% 96%  Weight:      Height:        Constitutional: NAD, calm, comfortable Vitals:   06/23/20 1530 06/23/20 1600 06/23/20 1630 06/23/20 1730  BP: 127/83 (!) 141/75 (!) 151/77 (!) 154/104  Pulse: 78 77 75 85  Resp: (!) 22 (!) 22 16 18   Temp:      TempSrc:      SpO2: 95% 96% 97% 96%  Weight:      Height:       Eyes: PERRL, lids and conjunctivae normal ENMT: Mucous membranes are moist. Posterior pharynx clear of any exudate or lesions.Normal dentition.  Neck: normal, supple, no masses, no thyromegaly Respiratory: clear to auscultation bilaterally, no wheezing, no crackles. Normal respiratory effort. No accessory muscle use.  Cardiovascular: Regular rate and rhythm, no murmurs / rubs / gallops. No extremity edema. 2+ pedal pulses. No carotid bruits.  Abdomen: no tenderness, no masses palpated. No hepatosplenomegaly. Bowel sounds positive.  Musculoskeletal: no clubbing / cyanosis. No joint deformity upper and lower extremities. Good ROM, no contractures. Normal muscle tone.  Skin: 1.5 cm laceration on the chin, hemostatic.  No other rashes or lesions Neurologic: CN 2-12 grossly intact. Sensation intact, DTR normal. Strength 5/5 in all 4.  Psychiatric: Normal judgment and insight. Alert and oriented x 3. Normal mood.    Labs on Admission: I have personally reviewed following labs and imaging studies  CBC: Recent Labs  Lab 06/23/20 1451  WBC 5.7  HGB 12.3*  HCT 35.4*  MCV 91.9  PLT 195   Basic Metabolic Panel: Recent Labs  Lab 06/23/20 1451  NA 138  K 3.9  CL 106  CO2 26  GLUCOSE 122*  BUN 17  CREATININE 1.30*  CALCIUM 8.5*   GFR: Estimated Creatinine Clearance: 43.8 mL/min (A) (by C-G formula based on SCr of 1.3 mg/dL (H)). Liver Function Tests: No results for input(s): AST, ALT, ALKPHOS, BILITOT,  PROT, ALBUMIN in the last 168 hours. No results for input(s): LIPASE, AMYLASE in the last 168 hours. No results for input(s): AMMONIA in the last 168 hours. Coagulation Profile: Recent Labs  Lab 06/23/20 1451  INR 1.3*   Cardiac Enzymes: No results for input(s): CKTOTAL, CKMB, CKMBINDEX, TROPONINI in the last 168 hours. BNP (last 3 results) No results for input(s): PROBNP in the last 8760 hours. HbA1C: No results for input(s): HGBA1C in the last 72 hours. CBG: No results for input(s): GLUCAP in the last 168 hours. Lipid Profile: No results for input(s): CHOL, HDL, LDLCALC, TRIG, CHOLHDL, LDLDIRECT in the last 72 hours. Thyroid Function Tests: No results for input(s): TSH, T4TOTAL, FREET4, T3FREE, THYROIDAB in the last 72  hours. Anemia Panel: No results for input(s): VITAMINB12, FOLATE, FERRITIN, TIBC, IRON, RETICCTPCT in the last 72 hours. Urine analysis: No results found for: COLORURINE, APPEARANCEUR, LABSPEC, PHURINE, GLUCOSEU, HGBUR, BILIRUBINUR, KETONESUR, PROTEINUR, UROBILINOGEN, NITRITE, LEUKOCYTESUR  Radiological Exams on Admission: CT Head Wo Contrast  Result Date: 06/23/2020 CLINICAL DATA:  Facial laceration after fall. EXAM: CT HEAD WITHOUT CONTRAST CT MAXILLOFACIAL WITHOUT CONTRAST CT CERVICAL SPINE WITHOUT CONTRAST TECHNIQUE: Multidetector CT imaging of the head, cervical spine, and maxillofacial structures were performed using the standard protocol without intravenous contrast. Multiplanar CT image reconstructions of the cervical spine and maxillofacial structures were also generated. COMPARISON:  None. FINDINGS: CT HEAD FINDINGS Brain: Mild chronic ischemic white matter disease is noted. Old right frontal infarction is noted. Probable small focus of subarachnoid hemorrhage seen in the superior portion of the right parietal cortex. Ventricular size is within normal limits. No mass effect or midline shift is noted. No acute infarction or mass lesion is noted. Vascular: No  hyperdense vessel or unexpected calcification. Skull: Normal. Negative for fracture or focal lesion. Other: None. CT MAXILLOFACIAL FINDINGS Osseous: No fracture or mandibular dislocation. No destructive process. Orbits: Negative. No traumatic or inflammatory finding. Sinuses: Clear. Soft tissues: Negative. CT CERVICAL SPINE FINDINGS Alignment: Normal. Skull base and vertebrae: No acute fracture. No primary bone lesion or focal pathologic process. Soft tissues and spinal canal: No prevertebral fluid or swelling. No visible canal hematoma. Disc levels: Severe degenerative disc disease is noted at C3-4 and C5-6. Mild degenerative disc disease is noted at C4-5 and C6-7. Upper chest: Negative. Other: None. IMPRESSION: 1. Probable small focus of subarachnoid hemorrhage seen in the superior portion of the right parietal cortex. Mild chronic ischemic white matter disease. Old right frontal infarction. Critical Value/emergent results were called by telephone at the time of interpretation on 06/23/2020 at 4:02 pm to provider Dr. Scotty CourtStafford, who verbally acknowledged these results. 2. No significant abnormality seen in the maxillofacial region. 3. Multilevel degenerative disc disease. No acute abnormality seen in the cervical spine. Electronically Signed   By: Lupita RaiderJames  Green Jr M.D.   On: 06/23/2020 16:02   CT Cervical Spine Wo Contrast  Result Date: 06/23/2020 CLINICAL DATA:  Facial laceration after fall. EXAM: CT HEAD WITHOUT CONTRAST CT MAXILLOFACIAL WITHOUT CONTRAST CT CERVICAL SPINE WITHOUT CONTRAST TECHNIQUE: Multidetector CT imaging of the head, cervical spine, and maxillofacial structures were performed using the standard protocol without intravenous contrast. Multiplanar CT image reconstructions of the cervical spine and maxillofacial structures were also generated. COMPARISON:  None. FINDINGS: CT HEAD FINDINGS Brain: Mild chronic ischemic white matter disease is noted. Old right frontal infarction is noted. Probable  small focus of subarachnoid hemorrhage seen in the superior portion of the right parietal cortex. Ventricular size is within normal limits. No mass effect or midline shift is noted. No acute infarction or mass lesion is noted. Vascular: No hyperdense vessel or unexpected calcification. Skull: Normal. Negative for fracture or focal lesion. Other: None. CT MAXILLOFACIAL FINDINGS Osseous: No fracture or mandibular dislocation. No destructive process. Orbits: Negative. No traumatic or inflammatory finding. Sinuses: Clear. Soft tissues: Negative. CT CERVICAL SPINE FINDINGS Alignment: Normal. Skull base and vertebrae: No acute fracture. No primary bone lesion or focal pathologic process. Soft tissues and spinal canal: No prevertebral fluid or swelling. No visible canal hematoma. Disc levels: Severe degenerative disc disease is noted at C3-4 and C5-6. Mild degenerative disc disease is noted at C4-5 and C6-7. Upper chest: Negative. Other: None. IMPRESSION: 1. Probable small focus of subarachnoid hemorrhage seen  in the superior portion of the right parietal cortex. Mild chronic ischemic white matter disease. Old right frontal infarction. Critical Value/emergent results were called by telephone at the time of interpretation on 06/23/2020 at 4:02 pm to provider Dr. Scotty Court, who verbally acknowledged these results. 2. No significant abnormality seen in the maxillofacial region. 3. Multilevel degenerative disc disease. No acute abnormality seen in the cervical spine. Electronically Signed   By: Lupita Raider M.D.   On: 06/23/2020 16:02   CT Maxillofacial Wo Contrast  Result Date: 06/23/2020 CLINICAL DATA:  Facial laceration after fall. EXAM: CT HEAD WITHOUT CONTRAST CT MAXILLOFACIAL WITHOUT CONTRAST CT CERVICAL SPINE WITHOUT CONTRAST TECHNIQUE: Multidetector CT imaging of the head, cervical spine, and maxillofacial structures were performed using the standard protocol without intravenous contrast. Multiplanar CT image  reconstructions of the cervical spine and maxillofacial structures were also generated. COMPARISON:  None. FINDINGS: CT HEAD FINDINGS Brain: Mild chronic ischemic white matter disease is noted. Old right frontal infarction is noted. Probable small focus of subarachnoid hemorrhage seen in the superior portion of the right parietal cortex. Ventricular size is within normal limits. No mass effect or midline shift is noted. No acute infarction or mass lesion is noted. Vascular: No hyperdense vessel or unexpected calcification. Skull: Normal. Negative for fracture or focal lesion. Other: None. CT MAXILLOFACIAL FINDINGS Osseous: No fracture or mandibular dislocation. No destructive process. Orbits: Negative. No traumatic or inflammatory finding. Sinuses: Clear. Soft tissues: Negative. CT CERVICAL SPINE FINDINGS Alignment: Normal. Skull base and vertebrae: No acute fracture. No primary bone lesion or focal pathologic process. Soft tissues and spinal canal: No prevertebral fluid or swelling. No visible canal hematoma. Disc levels: Severe degenerative disc disease is noted at C3-4 and C5-6. Mild degenerative disc disease is noted at C4-5 and C6-7. Upper chest: Negative. Other: None. IMPRESSION: 1. Probable small focus of subarachnoid hemorrhage seen in the superior portion of the right parietal cortex. Mild chronic ischemic white matter disease. Old right frontal infarction. Critical Value/emergent results were called by telephone at the time of interpretation on 06/23/2020 at 4:02 pm to provider Dr. Scotty Court, who verbally acknowledged these results. 2. No significant abnormality seen in the maxillofacial region. 3. Multilevel degenerative disc disease. No acute abnormality seen in the cervical spine. Electronically Signed   By: Lupita Raider M.D.   On: 06/23/2020 16:02    EKG: Independently reviewed.  Normal sinus rhythm  Assessment/Plan Active Problems:   Subarachnoid bleed (HCC)   Small subarachnoid  hemorrhage Patient had a presumed mechanical fall and suffered a laceration of the chin.  CT imaging revealed a small focus of subarachnoid hemorrhage.  ED provider spoke with [facility]surgery over phone.  Recommending admission here for observation.  Keppra 500 twice daily x7days.  Good blood pressure control.  Hold anticoagulation. -Repeat CAT scan place for 1930 tonight.  If repeat CAT scan demonstrates evolving or growing bleed recommend stat administration of Kcentra for reversal of anticoagulation and consideration for transfer to higher level of care for neurosurgical evaluation.  Hypertension Continue home medications, amlodipine As needed hydralazine  Hyperlipidemia Continue atorvastatin  BPH Continue finasteride and Flomax  History of CVA Patient has had 2 CVAs with residual left-sided weakness.  Etiology is unclear however both of them occurred 3 days after patient's to Covid vaccines.  Patient was placed on therapeutic anticoagulation as result -Anticoagulation is currently on hold.  Will need to reevaluate however per [facility]surgery over phone recommending holding anticoagulation for 7 days.  DVT prophylaxis:  SCDs Code Status: Full Family Communication: None at bedside Disposition Plan: Return home 7/18 Consults called: None Admission status: Obs   Tresa Moore MD [facility]   If 7PM-7AM, please contact night-coverage  06/23/2020, 5:57 PM

## 2020-06-23 NOTE — ED Provider Notes (Signed)
-----------------------------------------   3:00 PM on 06/23/2020 -----------------------------------------  I have personally seen and evaluated the patient in conjunction with physician assistant Gala Romney.  Patient has suffered a fall with a small chin laceration.  Patient is on Eliquis after suffering 2 strokes previously.  We will obtain a CT scan of the head and face as a precaution.  Patient states he has left-sided weakness from his prior stroke, supposed to be using a cane when he ambulates but he was not doing so.  Patient states mechanical fall.  Patient has an approximate 1.5 cm laceration to the base of his chin which is hemostatic and well approximated.  If CT images and labs are nonrevealing patient will likely be discharged home with PCP follow-up.   Minna Antis, MD 06/23/20 1501

## 2020-06-24 ENCOUNTER — Encounter: Payer: Self-pay | Admitting: Internal Medicine

## 2020-06-24 ENCOUNTER — Observation Stay: Payer: Medicare HMO

## 2020-06-24 DIAGNOSIS — I609 Nontraumatic subarachnoid hemorrhage, unspecified: Secondary | ICD-10-CM | POA: Diagnosis not present

## 2020-06-24 LAB — CBC
HCT: 35.2 % — ABNORMAL LOW (ref 39.0–52.0)
Hemoglobin: 11.8 g/dL — ABNORMAL LOW (ref 13.0–17.0)
MCH: 31.9 pg (ref 26.0–34.0)
MCHC: 33.5 g/dL (ref 30.0–36.0)
MCV: 95.1 fL (ref 80.0–100.0)
Platelets: 174 10*3/uL (ref 150–400)
RBC: 3.7 MIL/uL — ABNORMAL LOW (ref 4.22–5.81)
RDW: 12.7 % (ref 11.5–15.5)
WBC: 6.8 10*3/uL (ref 4.0–10.5)
nRBC: 0 % (ref 0.0–0.2)

## 2020-06-24 LAB — BASIC METABOLIC PANEL
Anion gap: 3 — ABNORMAL LOW (ref 5–15)
BUN: 16 mg/dL (ref 8–23)
CO2: 26 mmol/L (ref 22–32)
Calcium: 8.3 mg/dL — ABNORMAL LOW (ref 8.9–10.3)
Chloride: 109 mmol/L (ref 98–111)
Creatinine, Ser: 1.24 mg/dL (ref 0.61–1.24)
GFR calc Af Amer: >60 mL/min (ref >60)
GFR calc non Af Amer: 55 mL/min — ABNORMAL LOW (ref >60)
Glucose, Bld: 98 mg/dL (ref 70–99)
Potassium: 3.8 mmol/L (ref 3.5–5.1)
Sodium: 138 mmol/L (ref 135–145)

## 2020-06-24 LAB — APTT: aPTT: 35 seconds (ref 24–36)

## 2020-06-24 LAB — PROTIME-INR
INR: 1.3 — ABNORMAL HIGH (ref 0.8–1.2)
Prothrombin Time: 15.3 seconds — ABNORMAL HIGH (ref 11.4–15.2)

## 2020-06-24 IMAGING — CT CT HEAD W/O CM
3 series · 16 of 47 positions shown, 19 images · non-contrast
Comparison: [DATE]

CLINICAL DATA: Subarachnoid hemorrhage, follow-up

EXAM:
CT HEAD WITHOUT CONTRAST
TECHNIQUE: Contiguous axial images were obtained from the base of the skull
through the vertex without intravenous contrast.

[Series 3: head wo · axial · 0.39mm/px · z∈[+150,+275]mm · 10 of 30 slices shown, 13 images]
[im 3/30  brain]
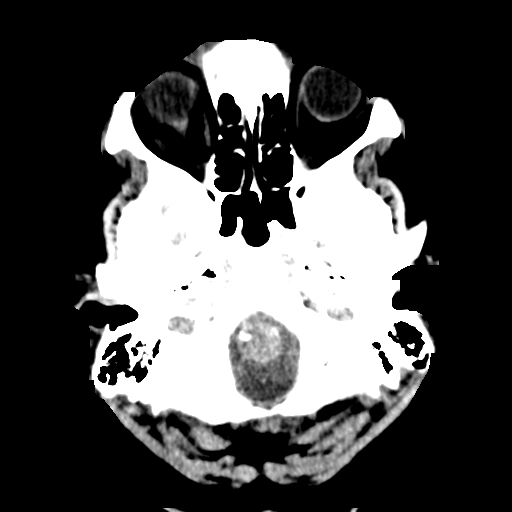
[im 3/30  bone]
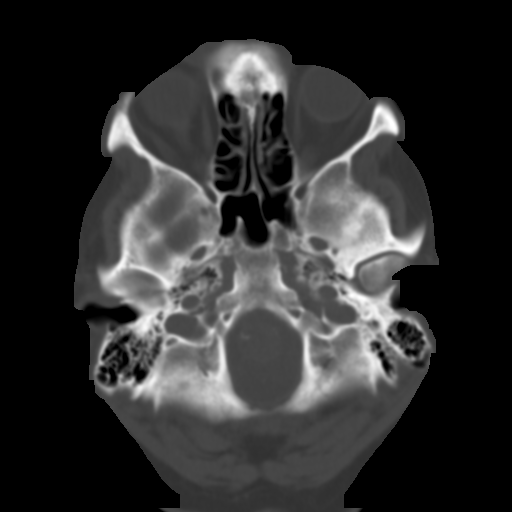
[im 6/30  brain]
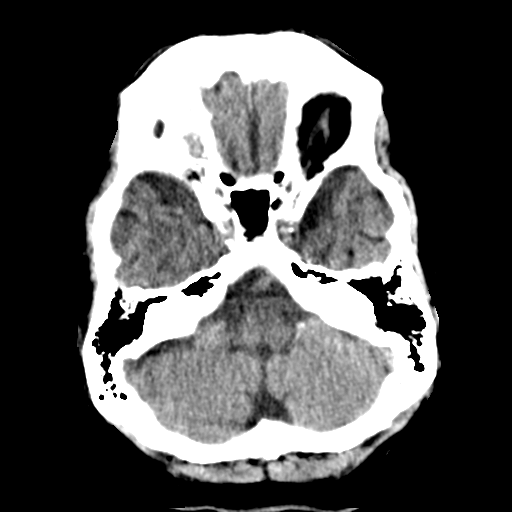
[im 9/30  brain]
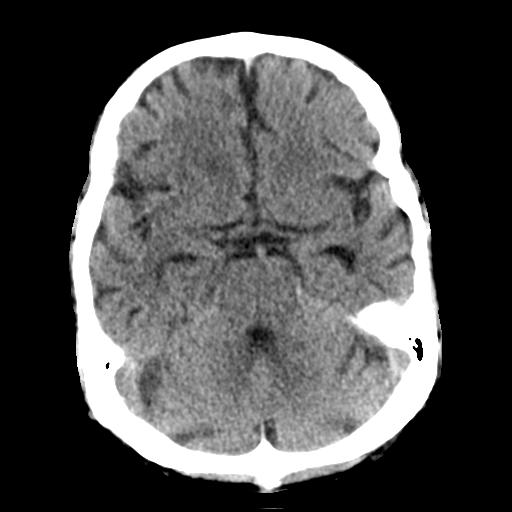
[im 11/30  brain]
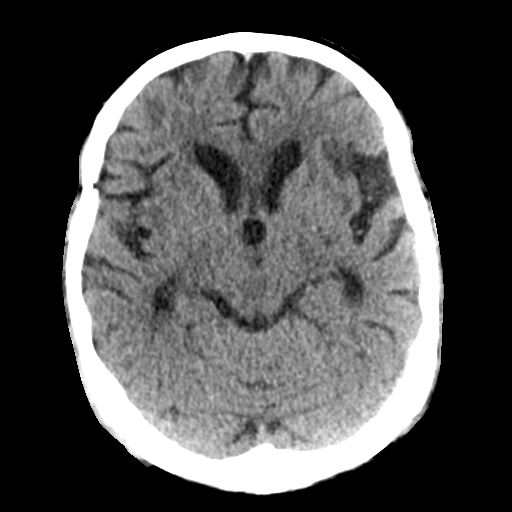
[im 14/30  brain]
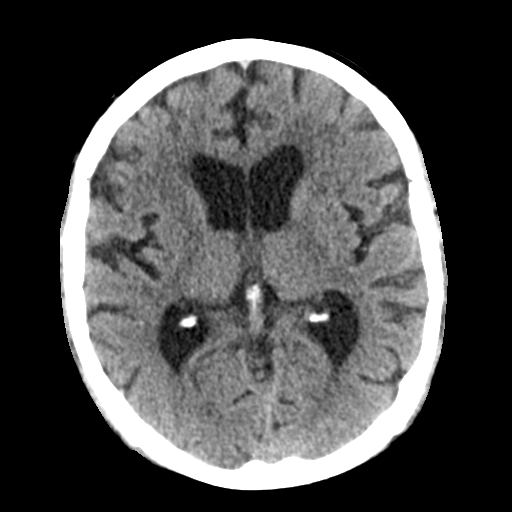
[im 14/30  bone]
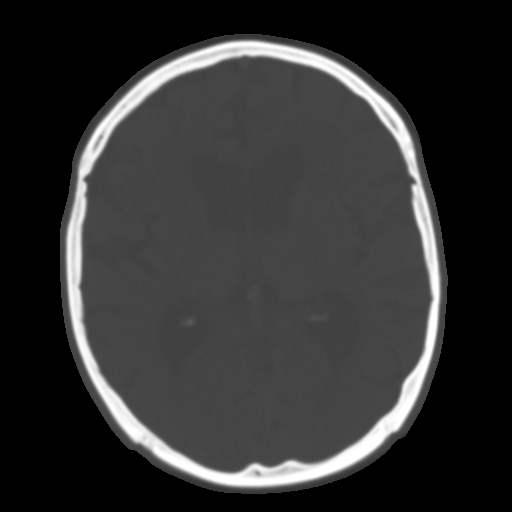
[im 17/30  brain]
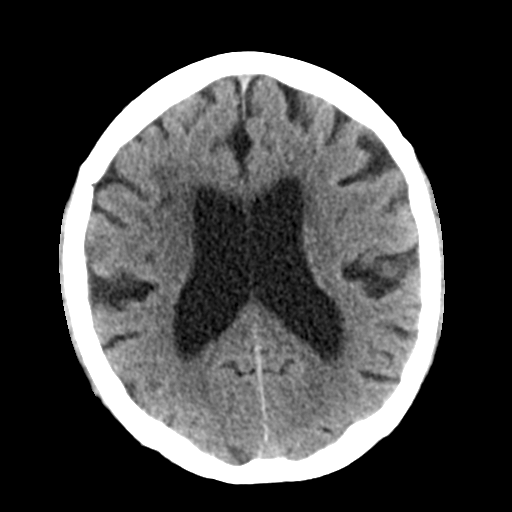
[im 20/30  brain]
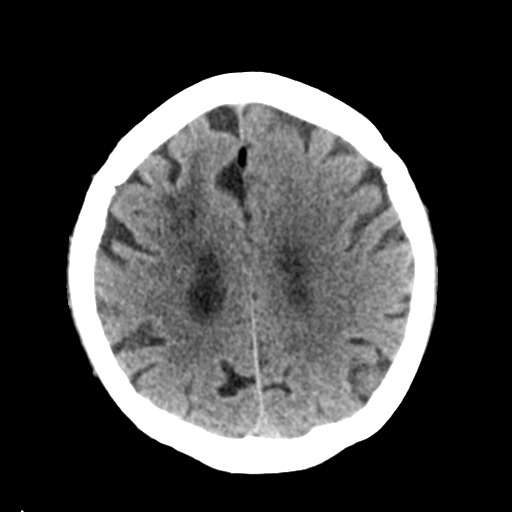
[im 23/30  brain]
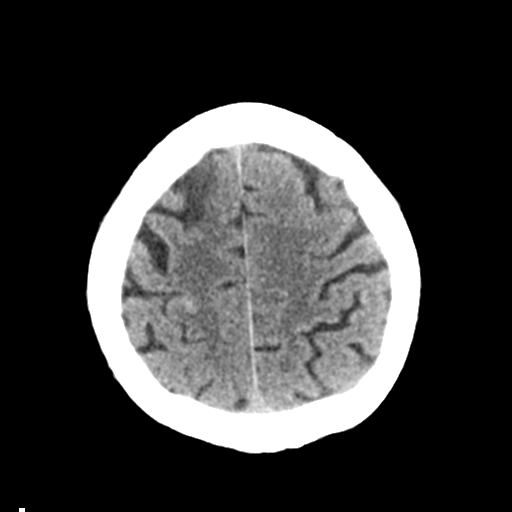
[im 25/30  brain]
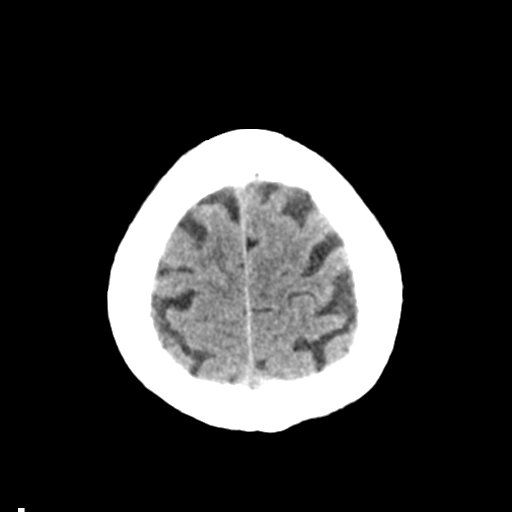
[im 25/30  bone]
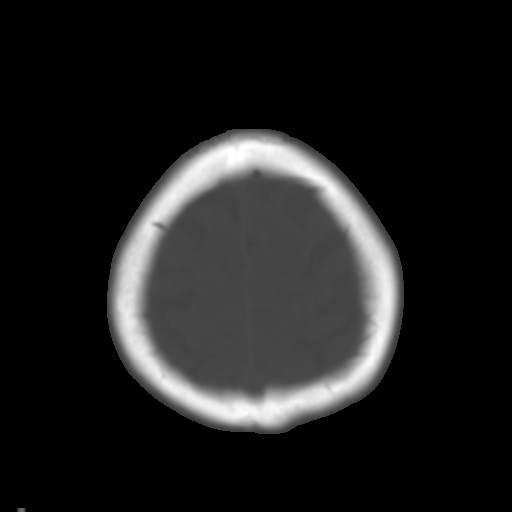
[im 28/30  brain]
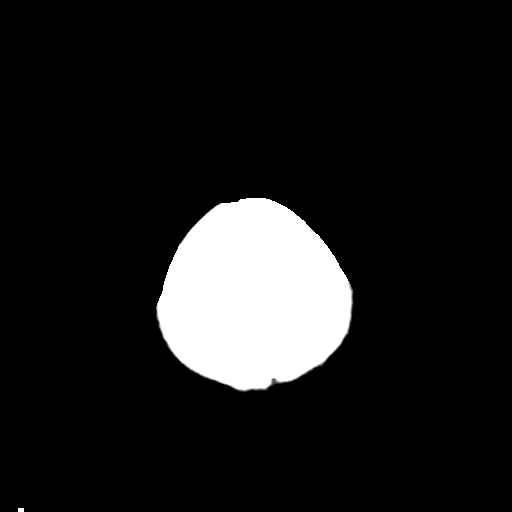

[Series 4: coronal soft tissue · coronal · 0.29mm/px · 3 of 64 slices shown]
[im 22/64  brain]
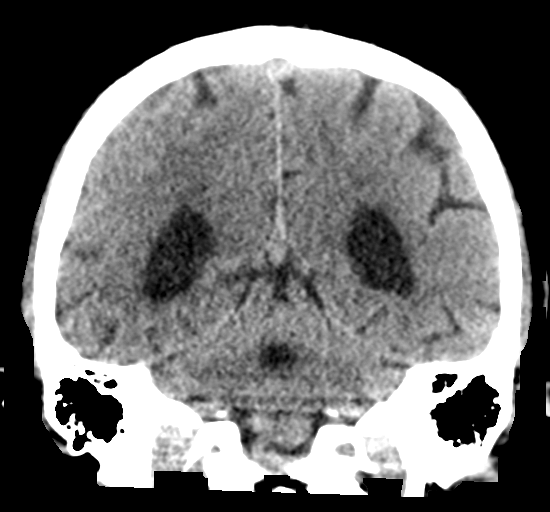
[im 29/64  brain]
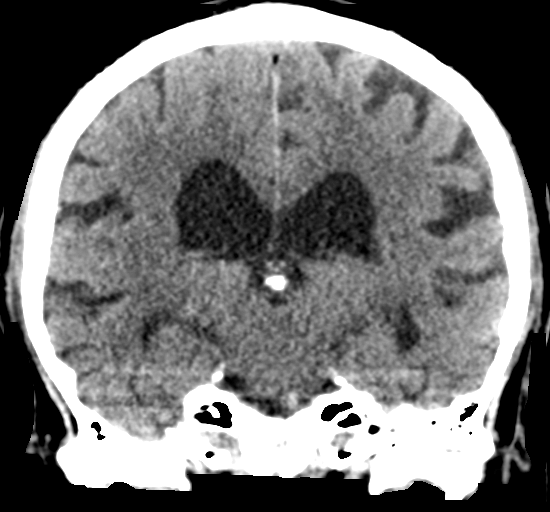
[im 36/64  brain]
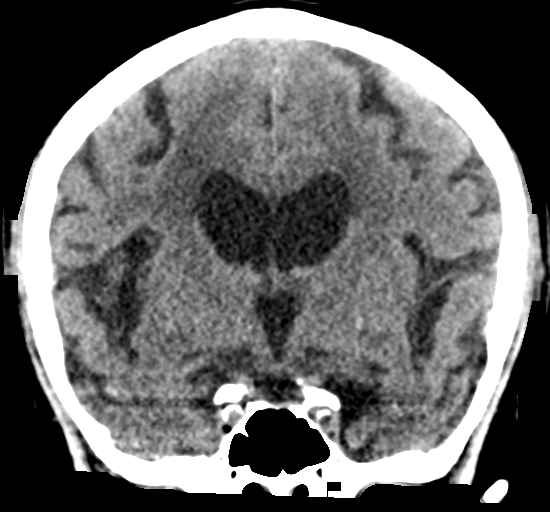

[Series 5: sagittal soft tissue · sagittal · 0.29mm/px · 3 of 54 slices shown]
[im 18/54  brain]
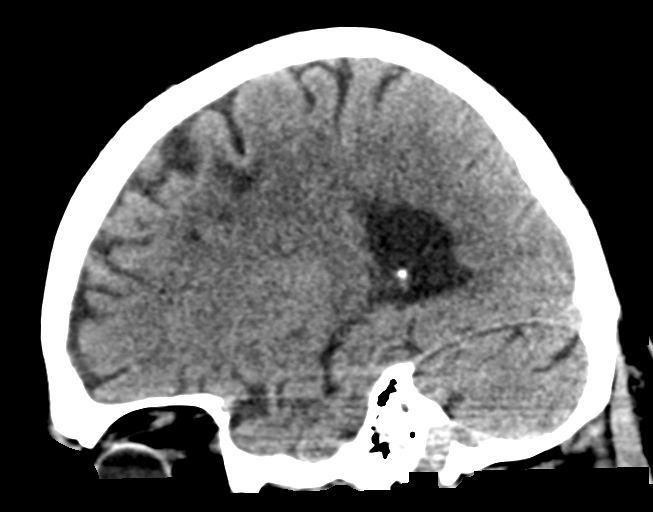
[im 27/54  brain]
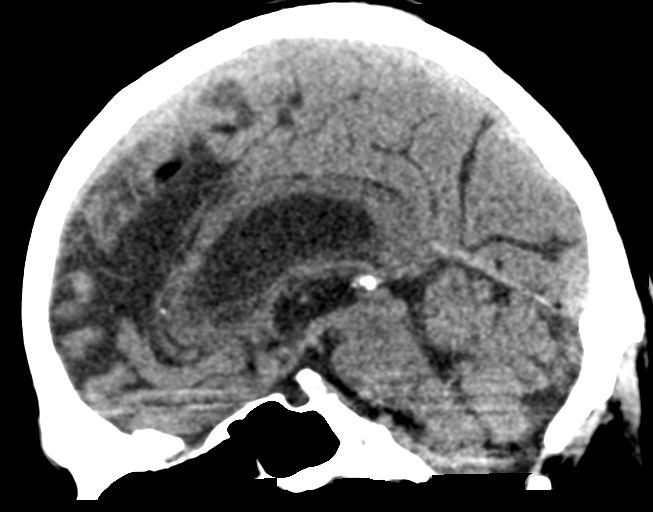
[im 36/54  brain]
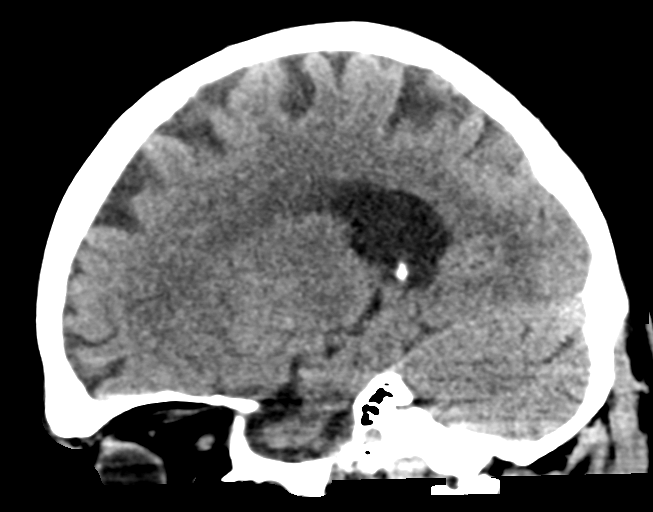

[16 of 47 positions shown; findings below may reference images not displayed]

FINDINGS: Brain: Decreased trace right parietal convexity sulcal subarachnoid
hemorrhage. No new hemorrhage.

Ventricles are stable in size. Stable findings of right frontal
infarct and chronic microvascular ischemic changes. No mass effect.

Vascular: No new findings.

Skull: Calvarium is unremarkable.

Sinuses/Orbits: No acute finding.

Other: None.
IMPRESSION: Decreased trace sulcal subarachnoid hemorrhage.  No new findings.

## 2020-06-24 NOTE — TOC Initial Note (Signed)
Transition of Care [facility]) - Initial/Assessment Note    Patient Details  Name: Edward Campbell MRN: [id number] Date of Birth: 1941/05/29  [facility] CM/SW Contact:    Luvenia Redden, RN Phone Number:[phone] 06/24/2020, 5:23 PM  Clinical Narrative:                  RN received a request for pt to discharged with HHPT. RN spoke with pt who did not have a preference. RN attempted services with several [facility] agency as noted: [facility] (declined) [facility] (declined) [facility] (declined) [facility] (declined)  Informed pt previous had out patient PT services with a pending appointment for tomorrow. Dr. Edson Snowball aware and pt may discharged with resumption of that services. RN attempted to contact pt's friend Michelene Heady [phone] however unsuccessful. Other number for [facility] Palinkas not working as pt states this is to his x-wife. No other request at this time. Pt reports there following: Home-lives in an apartment alone  Transportation-Has plenty of church members to assist with taking him to his appointments with no delays or issues. Food-meals on wheels for lunch and orders out at [facility] for dinner every night, not a problems. Pt has indicated he has had issues with finding a HHealth in the past and he is not surprised with the mentioned declines for service. No barriers identified at this time.  TOC will continue to follow up accordingly.   Expected Discharge Plan: OP Rehab Barriers to Discharge: No Barriers Identified   Patient Goals and CMS Choice Patient states their goals for this hospitalization and ongoing recovery are:: Wants to go home   Choice offered to / list presented to : Patient  Expected Discharge Plan and Services Expected Discharge Plan: OP Rehab       Living arrangements for the past 2 months: Apartment                             HH Agency: [facility], [facility] Date [facility] Agency Contacted: 06/24/20 Time HH Agency Contacted: 1200 Representative spoke with at [facility] Agency: [facility]  Prior Living Arrangements/Services Living arrangements for the past 2 months: Apartment Lives with:: Self Patient language and need for interpreter reviewed:: Yes Do you feel safe going back to the place where you live?: Yes (Pt will resume out patient PT services with a pending appointment tomorrow)      Need for Family Participation in Patient Care: Yes (Comment) (Church family)     Criminal Activity/Legal Involvement Pertinent to Current Situation/Hospitalization: No - Comment as needed  Activities of Daily Living Home Assistive Devices/Equipment: None ADL Screening (condition at time of admission) Patient's cognitive ability adequate to safely complete daily activities?: Yes Is the patient deaf or have difficulty hearing?: Yes Does the patient have difficulty seeing, even when wearing glasses/contacts?: Yes Does the patient have difficulty concentrating, remembering, or making decisions?: No Patient able to express need for assistance with ADLs?: Yes Does the patient have difficulty dressing or bathing?: No Independently performs ADLs?: Yes (appropriate for developmental age) Does the patient have difficulty walking or climbing stairs?: Yes Weakness of Legs: None Weakness of Arms/Hands: Left  Permission Sought/Granted Permission sought to share information with : Case Manager Permission granted to share information with : Yes, Verbal Permission Granted  Share Information with NAME: Michelene Heady     Permission granted to share  info w Relationship: Friend  Permission granted to share info w Contact Information: [place] [phone]  Emotional Assessment Appearance:: Well-Groomed Attitude/Demeanor/Rapport:  Engaged Affect (typically observed): Accepting Orientation: : Oriented to Self, Oriented to Place, Oriented to  Time, Oriented to Situation   Psych Involvement: No (comment)  Admission diagnosis:  Subarachnoid hemorrhage (HCC) [I60.9] Subarachnoid bleed (HCC) [I60.9] Fall, initial encounter [W19.XXXA] Chin laceration, initial encounter [S01.81XA] Patient Active Problem List   Diagnosis Date Noted  . Subarachnoid bleed (HCC) 06/23/2020  . HYPERCHOLESTEROLEMIA 12/04/2008  . 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 SUPERFICIAL PHLEBITIS 12/04/2008  . CHRONIC PROSTATITIS 12/04/2008  . PROSTATE SPECIFIC ANTIGEN, ELEVATED 12/04/2008   PCP:  Dione Housekeeper, MD Pharmacy:   [facility] [address], [place] - [address] [place] [address] Phone: [phone] Fax: [phone]     Social Determinants of Health (SDOH) Interventions    Readmission Risk Interventions No flowsheet data found.

## 2020-06-24 NOTE — Progress Notes (Signed)
PROGRESS NOTE    Edward Campbell  KGU:[id number] DOB: 08-Apr-1941 DOA: 06/23/2020 PCP: Dione Housekeeper, MD    Chief Complaint  Patient presents with  . Fall    Brief Narrative:   79 year old male with prior history of CVA, hypertension, hyperlipidemia presents with a mechanical fall was found to have a small  subarachnoid hemorrhage.  Neurosurgery consulted by EDP recommended overnight observation and repeat CT scan in 8 hours and if the subarachnoid hemorrhage does not worsen plan to hold anticoagulation for 7 days and continue Keppra twice daily 500 mg for 7 days. CT head without contrast repeated earlier this morning showed stable subarachnoid hemorrhage.  On further discussion with the patient's friends they feel his speech is slightly slurred when compared to his baseline.  Meanwhile PT evaluation is also pending at this time.  Patient lives by himself and is very unsteady on his feet and would not be a safe discharge home until he gets evaluated by physical therapy.  Patient seen and examined at bedside denies any new complaints at this time. We will plan to repeat CT of the head around 7 PM today Assessment & Plan:   Active Problems:   Subarachnoid bleed (HCC)   Small subarachnoid hemorrhage Repeat CT of the head shows stable subarachnoid hemorrhage earlier this morning.  Neurosurgery was consulted by EDP who recommended holding anticoagulation for 7 days and to start him on Keppra for several days. His friend who spoke with the patient's reports that patient has new slurred speech when compared to his baseline we will repeat a CT scan of the head without contrast later today at    Essential hypertension Blood pressure parameters are well controlled. Continue with amlodipine and hydralazine as needed.    History of CVA with residual left-sided weakness.  He was started on ELIQUIS and aspirin.    Hyperlipidemia  continue with statin.    DVT prophylaxis: SCDs Code  Status: Full code) Family Communication: None at bedside Disposition:   Status is: Observation  The patient will require care spanning > 2 midnights and should be moved to inpatient because: Unsafe d/c plan  Dispo: The patient is from: Home              Anticipated d/c is to: Pending              Anticipated d/c date is: 1 day              Patient currently is not medically stable to d/c.       Consultants:   Neurosurgery by EDP  Procedures: CT of the head without contrast  Antimicrobials: None   Subjective: Patient's friend feels like patient has new slurring of speech when compared to his baseline  Objective: Vitals:   06/23/20 2331 06/24/20 0609 06/24/20 1039 06/24/20 1157  BP: 121/66 101/65 117/73 105/71  Pulse: 62 75 63 (!) 59  Resp: 20 20  18   Temp: 98 F (36.7 C) 98.2 F (36.8 C) 97.9 F (36.6 C) 98.2 F (36.8 C)  TempSrc: Oral Oral Oral Oral  SpO2: 97% 99% 95% 98%  Weight:      Height:        Intake/Output Summary (Last 24 hours) at 06/24/2020 1507 Last data filed at 06/24/2020 1300 Gross per 24 hour  Intake 360 ml  Output 1625 ml  Net -1265 ml   Filed Weights   06/23/20 1418  Weight: 73.9 kg    Examination:  General exam: Appears  calm and comfortable  Respiratory system: Clear to auscultation. Respiratory effort normal. Cardiovascular system: S1 & S2 heard, RRR. No JVD, murmurs,  No pedal edema. Gastrointestinal system: Abdomen is nondistended, soft and nontender.Normal bowel sounds heard. Central nervous system: Alert and oriented. No focal neurological deficits.  Extremities: Symmetric 5 x 5 power. Skin: No rashes, lesions or ulcers Psychiatry:  Mood & affect appropriate.     Data Reviewed: I have personally reviewed following labs and imaging studies  CBC: Recent Labs  Lab 06/23/20 1451 06/24/20 0425  WBC 5.7 6.8  HGB 12.3* 11.8*  HCT 35.4* 35.2*  MCV 91.9 95.1  PLT 195 174    Basic Metabolic Panel: Recent Labs  Lab  06/23/20 1451 06/24/20 0425  NA 138 138  K 3.9 3.8  CL 106 109  CO2 26 26  GLUCOSE 122* 98  BUN 17 16  CREATININE 1.30* 1.24  CALCIUM 8.5* 8.3*    GFR: Estimated Creatinine Clearance: 46 mL/min (by C-G formula based on SCr of 1.24 mg/dL).  Liver Function Tests: No results for input(s): AST, ALT, ALKPHOS, BILITOT, PROT, ALBUMIN in the last 168 hours.  CBG: No results for input(s): GLUCAP in the last 168 hours.   Recent Results (from the past 240 hour(s))  SARS Coronavirus 2 by RT PCR (hospital order, performed in [facility] hospital lab) Nasopharyngeal Nasopharyngeal Swab     Status: None   Collection Time: 06/23/20  5:51 PM   Specimen: Nasopharyngeal Swab  Result Value Ref Range Status   SARS Coronavirus 2 NEGATIVE NEGATIVE Final    Comment: (NOTE) SARS-CoV-2 target nucleic acids are NOT DETECTED.  The SARS-CoV-2 RNA is generally detectable in upper and lower respiratory specimens during the acute phase of infection. The lowest concentration of SARS-CoV-2 viral copies this assay can detect is 250 copies / mL. A negative result does not preclude SARS-CoV-2 infection and should not be used as the sole basis for treatment or other patient management decisions.  A negative result may occur with improper specimen collection / handling, submission of specimen other than nasopharyngeal swab, presence of viral mutation(s) within the areas targeted by this assay, and inadequate number of viral copies (<250 copies / mL). A negative result must be combined with clinical observations, patient history, and epidemiological information.  Fact Sheet for Patients:   BoilerBrush.com.cy  Fact Sheet for Healthcare Providers: https://pope.com/  This test is not yet approved or  cleared by the [place] FDA and has been authorized for detection and/or diagnosis of SARS-CoV-2 by FDA under an Emergency Use Authorization (EUA).  This EUA  will remain in effect (meaning this test can be used) for the duration of the COVID-19 declaration under Section 564(b)(1) of the Act, 21 U.S.C. section 360bbb-3(b)(1), unless the authorization is terminated or revoked sooner.  Performed at [facility], [address] [place], [place] [address]          Radiology Studies: CT HEAD WO CONTRAST  Result Date: 06/23/2020 CLINICAL DATA:  A follow-up from subarachnoid hemorrhage EXAM: CT HEAD WITHOUT CONTRAST TECHNIQUE: Contiguous axial images were obtained from the base of the skull through the vertex without intravenous contrast. COMPARISON:  CT earlier today at 3 p.m. FINDINGS: Brain: Again noted is a tiny amount of subarachnoid hemorrhage seen overlying the right superior frontoparietal hemisphere. This is not significantly changed since the prior exam. Area encephalomalacia is again noted within the right frontal lobe. There is dilatation the ventricles and sulci consistent with age-related atrophy. Low-attenuation changes  in the deep white matter consistent with small vessel ischemia. No new extra-axial collections are noted. Vascular: No hyperdense vessel or unexpected calcification. Skull: The skull is intact. No fracture or focal lesion identified. Sinuses/Orbits: The visualized paranasal sinuses and mastoid air cells are clear. The orbits and globes intact. Other: None IMPRESSION: Stable small amount of probable subarachnoid hemorrhage overlying the right frontoparietal convexity. Findings consistent with age related atrophy and chronic small vessel ischemia Prior area of infarct with encephalomalacia involving the right frontal lobe. Electronically Signed   By: Jonna Clark M.D.   On: 06/23/2020 19:40   CT Head Wo Contrast  Result Date: 06/23/2020 CLINICAL DATA:  Facial laceration after fall. EXAM: CT HEAD WITHOUT CONTRAST CT MAXILLOFACIAL WITHOUT CONTRAST CT CERVICAL SPINE WITHOUT CONTRAST TECHNIQUE: Multidetector CT imaging of  the head, cervical spine, and maxillofacial structures were performed using the standard protocol without intravenous contrast. Multiplanar CT image reconstructions of the cervical spine and maxillofacial structures were also generated. COMPARISON:  None. FINDINGS: CT HEAD FINDINGS Brain: Mild chronic ischemic white matter disease is noted. Old right frontal infarction is noted. Probable small focus of subarachnoid hemorrhage seen in the superior portion of the right parietal cortex. Ventricular size is within normal limits. No mass effect or midline shift is noted. No acute infarction or mass lesion is noted. Vascular: No hyperdense vessel or unexpected calcification. Skull: Normal. Negative for fracture or focal lesion. Other: None. CT MAXILLOFACIAL FINDINGS Osseous: No fracture or mandibular dislocation. No destructive process. Orbits: Negative. No traumatic or inflammatory finding. Sinuses: Clear. Soft tissues: Negative. CT CERVICAL SPINE FINDINGS Alignment: Normal. Skull base and vertebrae: No acute fracture. No primary bone lesion or focal pathologic process. Soft tissues and spinal canal: No prevertebral fluid or swelling. No visible canal hematoma. Disc levels: Severe degenerative disc disease is noted at C3-4 and C5-6. Mild degenerative disc disease is noted at C4-5 and C6-7. Upper chest: Negative. Other: None. IMPRESSION: 1. Probable small focus of subarachnoid hemorrhage seen in the superior portion of the right parietal cortex. Mild chronic ischemic white matter disease. Old right frontal infarction. Critical Value/emergent results were called by telephone at the time of interpretation on 06/23/2020 at 4:02 pm to provider Dr. Scotty Court, who verbally acknowledged these results. 2. No significant abnormality seen in the maxillofacial region. 3. Multilevel degenerative disc disease. No acute abnormality seen in the cervical spine. Electronically Signed   By: Lupita Raider M.D.   On: 06/23/2020 16:02   CT  Cervical Spine Wo Contrast  Result Date: 06/23/2020 CLINICAL DATA:  Facial laceration after fall. EXAM: CT HEAD WITHOUT CONTRAST CT MAXILLOFACIAL WITHOUT CONTRAST CT CERVICAL SPINE WITHOUT CONTRAST TECHNIQUE: Multidetector CT imaging of the head, cervical spine, and maxillofacial structures were performed using the standard protocol without intravenous contrast. Multiplanar CT image reconstructions of the cervical spine and maxillofacial structures were also generated. COMPARISON:  None. FINDINGS: CT HEAD FINDINGS Brain: Mild chronic ischemic white matter disease is noted. Old right frontal infarction is noted. Probable small focus of subarachnoid hemorrhage seen in the superior portion of the right parietal cortex. Ventricular size is within normal limits. No mass effect or midline shift is noted. No acute infarction or mass lesion is noted. Vascular: No hyperdense vessel or unexpected calcification. Skull: Normal. Negative for fracture or focal lesion. Other: None. CT MAXILLOFACIAL FINDINGS Osseous: No fracture or mandibular dislocation. No destructive process. Orbits: Negative. No traumatic or inflammatory finding. Sinuses: Clear. Soft tissues: Negative. CT CERVICAL SPINE FINDINGS Alignment: Normal. Skull base and  vertebrae: No acute fracture. No primary bone lesion or focal pathologic process. Soft tissues and spinal canal: No prevertebral fluid or swelling. No visible canal hematoma. Disc levels: Severe degenerative disc disease is noted at C3-4 and C5-6. Mild degenerative disc disease is noted at C4-5 and C6-7. Upper chest: Negative. Other: None. IMPRESSION: 1. Probable small focus of subarachnoid hemorrhage seen in the superior portion of the right parietal cortex. Mild chronic ischemic white matter disease. Old right frontal infarction. Critical Value/emergent results were called by telephone at the time of interpretation on 06/23/2020 at 4:02 pm to provider Dr. Scotty CourtStafford, who verbally acknowledged these  results. 2. No significant abnormality seen in the maxillofacial region. 3. Multilevel degenerative disc disease. No acute abnormality seen in the cervical spine. Electronically Signed   By: Lupita RaiderJames  Green Jr M.D.   On: 06/23/2020 16:02   CT Maxillofacial Wo Contrast  Result Date: 06/23/2020 CLINICAL DATA:  Facial laceration after fall. EXAM: CT HEAD WITHOUT CONTRAST CT MAXILLOFACIAL WITHOUT CONTRAST CT CERVICAL SPINE WITHOUT CONTRAST TECHNIQUE: Multidetector CT imaging of the head, cervical spine, and maxillofacial structures were performed using the standard protocol without intravenous contrast. Multiplanar CT image reconstructions of the cervical spine and maxillofacial structures were also generated. COMPARISON:  None. FINDINGS: CT HEAD FINDINGS Brain: Mild chronic ischemic white matter disease is noted. Old right frontal infarction is noted. Probable small focus of subarachnoid hemorrhage seen in the superior portion of the right parietal cortex. Ventricular size is within normal limits. No mass effect or midline shift is noted. No acute infarction or mass lesion is noted. Vascular: No hyperdense vessel or unexpected calcification. Skull: Normal. Negative for fracture or focal lesion. Other: None. CT MAXILLOFACIAL FINDINGS Osseous: No fracture or mandibular dislocation. No destructive process. Orbits: Negative. No traumatic or inflammatory finding. Sinuses: Clear. Soft tissues: Negative. CT CERVICAL SPINE FINDINGS Alignment: Normal. Skull base and vertebrae: No acute fracture. No primary bone lesion or focal pathologic process. Soft tissues and spinal canal: No prevertebral fluid or swelling. No visible canal hematoma. Disc levels: Severe degenerative disc disease is noted at C3-4 and C5-6. Mild degenerative disc disease is noted at C4-5 and C6-7. Upper chest: Negative. Other: None. IMPRESSION: 1. Probable small focus of subarachnoid hemorrhage seen in the superior portion of the right parietal cortex. Mild  chronic ischemic white matter disease. Old right frontal infarction. Critical Value/emergent results were called by telephone at the time of interpretation on 06/23/2020 at 4:02 pm to provider Dr. Scotty CourtStafford, who verbally acknowledged these results. 2. No significant abnormality seen in the maxillofacial region. 3. Multilevel degenerative disc disease. No acute abnormality seen in the cervical spine. Electronically Signed   By: Lupita RaiderJames  Green Jr M.D.   On: 06/23/2020 16:02        Scheduled Meds: . amLODipine  5 mg Oral Daily  . atorvastatin  40 mg Oral QHS  . finasteride  5 mg Oral Daily  . folic acid  2 mg Oral Q0600  . levETIRAcetam  500 mg Oral BID  . pyridOXINE  50 mg Oral Q0600  . tamsulosin  0.4 mg Oral Daily  . vitamin B-12  500 mcg Oral Q0600   Continuous Infusions:   LOS: 0 days       Kathlen ModyVijaya Arbor Leer, MD [facility]   To contact the attending provider between 7A-7P or the covering provider during after hours 7P-7A, please log into the web site [email or web address] and access using universal [place] password for that web site. If you do not have  the password, please call the hospital operator.  06/24/2020, 3:07 PM

## 2020-06-25 ENCOUNTER — Ambulatory Visit: Payer: Medicare HMO | Admitting: Occupational Therapy

## 2020-06-25 ENCOUNTER — Encounter: Payer: Medicare HMO | Admitting: Speech Pathology

## 2020-06-25 ENCOUNTER — Encounter: Payer: Medicare HMO | Admitting: Occupational Therapy

## 2020-06-25 ENCOUNTER — Encounter: Payer: Self-pay | Admitting: Internal Medicine

## 2020-06-25 DIAGNOSIS — I609 Nontraumatic subarachnoid hemorrhage, unspecified: Secondary | ICD-10-CM | POA: Diagnosis not present

## 2020-06-25 DIAGNOSIS — E440 Moderate protein-calorie malnutrition: Secondary | ICD-10-CM | POA: Diagnosis not present

## 2020-06-25 DIAGNOSIS — W19XXXA Unspecified fall, initial encounter: Secondary | ICD-10-CM

## 2020-06-25 DIAGNOSIS — Z515 Encounter for palliative care: Secondary | ICD-10-CM

## 2020-06-25 MED ORDER — ELIQUIS 5 MG PO TABS: 5.0000 mg | ORAL_TABLET | Freq: Two times a day (BID) | ORAL | Status: DC

## 2020-06-25 MED ORDER — LEVETIRACETAM 500 MG PO TABS: 500.0000 mg | ORAL_TABLET | Freq: Two times a day (BID) | ORAL | 0 refills | Status: DC

## 2020-06-25 MED ORDER — ASPIRIN 81 MG PO TBEC: 81.0000 mg | DELAYED_RELEASE_TABLET | Freq: Every day | ORAL | Status: DC

## 2020-06-25 NOTE — Care Management Obs Status (Signed)
MEDICARE OBSERVATION STATUS NOTIFICATION   Patient Details  Name: Edward Campbell MRN: [id number] Date of Birth: 08/28/1941   Medicare Observation Status Notification Given:  Yes    Chapman Fitch, RN 06/25/2020, 12:05 PM

## 2020-06-25 NOTE — Progress Notes (Signed)
Initial Nutrition Assessment  DOCUMENTATION CODES:   Non-severe (moderate) malnutrition in context of social or environmental circumstances  INTERVENTION:   Ensure Enlive po BID, each supplement provides 350 kcal and 20 grams of protein  Liberalize diet   NUTRITION DIAGNOSIS:   Moderate Malnutrition related to social / environmental circumstances as evidenced by mild to moderate fat depletions, moderate to severe muscle depletions.  GOAL:   Patient will meet greater than or equal to 90% of their needs  MONITOR:   PO intake, Supplement acceptance, Labs, Weight trends, Skin, I & O's  REASON FOR ASSESSMENT:   Malnutrition Screening Tool    ASSESSMENT:   79 y.o. male with medical history significant of 2 recent CVAs, hypertension, hyperlipidemia who presents for evaluation after a fall and subsequent laceration to the chin. Pt found to have subarachnoid bleed   Met with pt in room today. Pt reports good appetite and oral intake pta and in hospital. Pt gets Meals on Wheels at home, delivered Monday- Friday. Pt prepares meals during the week and then stretches leftovers out over the weekend. Pt does not drink any supplements at home but reports that he does occasionally get a [employer] milkshake. Pt currently eating 100% of meals in hospital. Pt scheduled for discharge today but would like to have Ensure if for some reason he does not discharge. RD will Liberalize pt's diet as a heart healthy diet is restrictive of protein. Per chart, pt down 14lbs(8%) over the past year; this is not significant.   Medications reviewed and include: folic acid, L39  Labs reviewed:   NUTRITION - FOCUSED PHYSICAL EXAM:    Most Recent Value  Orbital Region Mild depletion  Upper Arm Region Moderate depletion  Thoracic and Lumbar Region Moderate depletion  Buccal Region Moderate depletion  Temple Region Moderate depletion  Clavicle Bone Region Mild depletion  Clavicle and Acromion Bone Region Mild  depletion  Scapular Bone Region Mild depletion  Dorsal Hand Severe depletion  Patellar Region Severe depletion  Anterior Thigh Region Moderate depletion  Posterior Calf Region Moderate depletion  Edema (RD Assessment) None  Hair Reviewed  Eyes Reviewed  Mouth Reviewed  Skin Reviewed  Nails Reviewed     Diet Order:   Diet Order            Diet regular Room service appropriate? Yes; Fluid consistency: Thin  Diet effective now           Diet - low sodium heart healthy                EDUCATION NEEDS:   Education needs have been addressed  Skin:  Skin Assessment: Reviewed RN Assessment (ecchymosis, laceration chin)  Last BM:  7/17  Height:   Ht Readings from Last 1 Encounters:  06/23/20 '5\' 8"'  (1.727 m)    Weight:   Wt Readings from Last 1 Encounters:  06/23/20 73.9 kg    Ideal Body Weight:  70 kg  BMI:  Body mass index is 24.78 kg/m.  Estimated Nutritional Needs:   Kcal:  1800-2100kcal/day  Protein:  90-105g/day  Fluid:  >1.8L/day  Koleen Distance MS, RD, LDN Please refer to [facility] for RD and/or RD on-call/weekend/after hours pager

## 2020-06-25 NOTE — Progress Notes (Signed)
Patient alert and oriented and states ready for discharge.  PIV removed with tip intact.  Discharge instructions and medication changes reviewed with patient.  Written instructions given as well.  Patient verbalized understanding and could retell instructions.  Outpatient therapy notified of patient in hospital and would resume care on 06/27/20.  Escorted by w/c to lobby for discharge home.

## 2020-06-25 NOTE — Plan of Care (Addendum)
Palliative:   Edward Campbell is to be discharged today.  He is returning to his home with out patient PT services already in place.  He would benefit from outpatient palliative services to follow.   Conference with attending, transition of care team related to patient disposition, need for outpatient palliative services to follow.  Plan:  DC home with [name] services already in place. Out patient palliative services to follow.   No charge  Lillia Carmel, NP Palliative Medicine Please call Palliative Medicine team phone with any questions [phone].

## 2020-06-25 NOTE — TOC Transition Note (Signed)
Transition of Care [facility]) - CM/SW Discharge Note   Patient Details  Name: Edward Campbell MRN: [id number] Date of Birth: 1940-12-25  [facility] CM/SW Contact:  Chapman Fitch, RN Phone Number: 06/25/2020, 12:06 PM   Clinical Narrative:     Patient to discharge home today PT has evaluated and recommends outpatient PT.  Patients states that he is already set up on Monday and Wednesday at [facility] outpatient [facility].  His brother will transport to therapy   Final next level of care: OP Rehab Barriers to Discharge: No Barriers Identified   Patient Goals and CMS Choice Patient states their goals for this hospitalization and ongoing recovery are:: Wants to go home   Choice offered to / list presented to : Patient  Discharge Placement                       Discharge Plan and Services                            [facility] Agency: [facility], [facility] Date [employer] Agency Contacted: 06/24/20 Time HH Agency Contacted: 1200 Representative spoke with at [facility] Agency: [facility]  Social Determinants of Health (SDOH) Interventions     Readmission Risk Interventions No flowsheet data found.

## 2020-06-25 NOTE — Discharge Instructions (Signed)
Subarachnoid Hemorrhage  Subarachnoid hemorrhage is bleeding between the brain and the layer that covers the brain. The bleeding puts pressure on the brain, and it stops blood from going to some areas of the brain. If this bleeding is not treated, it may cause brain damage, stroke, or death. This is an emergency. You must be treated in the hospital right away. You are more likely to get this condition if you:  Smoke.  Have high blood pressure.  Drink too much alcohol.  Are older than age 50.  Are male, especially if you have stopped getting your period for a year or longer (menopause).  Have a family history of burst blood vessels (aneurysms).  Have a certain syndrome that leads to one of these: ? Kidney disease. ? Disease of tissues like bones, blood, and fat (connective tissues). Signs of this bleeding condition include:  Sudden, very bad headache. It may feel like the worst headache you have ever had.  Feeling sick to your stomach (nausea) or throwing up (vomiting), especially if you have other signs such as a headache.  Sudden weakness or loss of feeling (numbness) in your face, arm, or leg, especially on one side of the body.  Sudden trouble with any of these: ? Walking. ? Moving an arm or leg. ? Talking. ? Understanding what people say. ? Swallowing. ? Seeing out of one eye or both eyes.  Sudden confusion.  Seeing double.  Loss of balance.  Sensitivity to light.  Stiff neck.  Trouble staying awake.  Passing out (fainting). Follow these instructions at home: Medicines  Take over-the-counter and prescription medicines only as told by your doctor.  Do not take any medicines that contain aspirin or NSAIDs (like ibuprofen) unless your doctor says that it is safe to take them. Lifestyle  Do not use any products that have nicotine or tobacco. These include cigarettes and e-cigarettes. If you need help quitting, ask your doctor.  Limit alcohol to 1 drink a  day for nonpregnant women and 2 drinks a day for men. One drink is equal to: ? 12 oz of beer. ? 5 oz of wine. ? 1 oz of hard liquor. Eating and drinking  Ask your doctor if it is safe for you to eat and drink. You may need tests to make sure that you can swallow safely (swallow studies). Driving  Do not drive until your doctor says that it is safe to drive.  Do not drive or use heavy machinery while taking prescription pain medicine. General instructions  Do therapy as recommended. This may include: ? Physical therapy (PT). ? Occupational therapy (OT). ? Speech-language therapy.  Rest and limit activity as told by your doctor. Rest helps your brain to heal. Make sure you: ? Get plenty of sleep. ? Avoid activities that cause stress to your body or mind.  Check your blood pressure as told by your doctor. Write down your blood pressure.  Keep all follow-up visits as told by your doctors. This is important. Contact a doctor if:  You have a stiff neck.  You have a cough.  You have a fever. Get help right away if:  You have any signs of a stroke. "BE FAST" is an easy way to remember the main warning signs: ? B - Balance. Signs are dizziness, sudden trouble walking, or loss of balance. ? E - Eyes. Signs are trouble seeing or a sudden change in how you see. ? F - Face. Signs are sudden weakness or loss   of feeling of the face, or the face or eyelid drooping on one side. ? A - Arms. Signs are weakness or loss of feeling in an arm. This happens suddenly and usually on one side of the body. ? S - Speech. Signs are sudden trouble speaking, slurred speech, or trouble understanding what people say. ? T - Time. Time to call emergency services. Write down what time symptoms started.  You have other signs of a stroke, such as: ? A sudden, very bad headache with no known cause. ? Feeling sick to your stomach. ? Throwing up. ? Jerky movements you cannot control (seizure). These symptoms  may be an emergency. Do not wait to see if the symptoms will go away. Get medical help right away. Call your local emergency services (911 in the [place] Do not drive yourself to the hospital. Summary  Subarachnoid hemorrhage is bleeding in the brain. It is an emergency. You must be treated in the hospital right away.  Follow instructions from your doctor about eating, resting, and taking medicines.  Do not take any medicines that contain aspirin or NSAIDs (like ibuprofen) unless your doctor says that it is safe to take them. This information is not intended to replace advice given to you by your health care provider. Make sure you discuss any questions you have with your health care provider. Document Revised: 11/06/2017 Document Reviewed: 09/03/2017 [employer] Patient Education  2020 [employer]  

## 2020-06-25 NOTE — Discharge Summary (Addendum)
Physician Discharge Summary  Edward Campbell WUJ:[id number] DOB: April 06, 1941 DOA: 06/23/2020  PCP: Dione Housekeeper, MD  Admit date: 06/23/2020 Discharge date: 06/25/2020  Admitted From: Home.  Disposition:  Home.   Recommendations for Outpatient Follow-up:  1. Follow up with PCP in 1-2 weeks 2. Please obtain BMP/CBC in one week 3. Please follow up with outpatient palliative care services.   Home Health:yes    Discharge Condition:stable. CODE STATUS:  Full code.  Diet recommendation: Heart Healthy  Brief/Interim Summary: 79 year old male with prior history of CVA, hypertension, hyperlipidemia presents with a mechanical fall was found to have a small  subarachnoid hemorrhage.  Neurosurgery consulted by EDP recommended overnight observation and repeat CT scan in 8 hours and if the subarachnoid hemorrhage does not worsen plan to hold anticoagulation for 7 days and continue Keppra twice daily 500 mg for 7 days. CT head without contrast repeated earlier this morning showed stable subarachnoid hemorrhage.  On further discussion with the patient's friends they feel his speech is slightly slurred when compared to his baseline.  Meanwhile PT evaluation is also pending at this time.  Patient lives by himself and is very unsteady on his feet and would not be a safe discharge home until he gets evaluated by physical therapy.  Patient seen and examined at bedside denies any new complaints at this time. rpeat CT head shows improvement in the sub arachnoid hemorrhage.    Discharge Diagnoses:  Active Problems:   Subarachnoid hemorrhage (HCC)   Malnutrition of moderate degree   Fall   Palliative care by specialist   Small subarachnoid hemorrhage Repeat CT of the head shows stable to improvement in the  subarachnoid hemorrhage earlier this morning.  Neurosurgery was consulted by EDP who recommended holding anticoagulation for 7 days and to plan for  Keppra for 7 days.     Essential  hypertension Blood pressure parameters are well controlled. Continue with amlodipine    History of CVA with residual left-sided weakness.   He was on eliquis and aspirin, which will be held for 7 days as per neuro surgery recommendations.    Hyperlipidemia  continue with statin.   Chin laceration:  Dry dressing to be applied.    Moderate malnutrition:  Nutrition consulted and recommendations given.   Discharge Instructions  Discharge Instructions    Ambulatory referral to Physical Therapy   Complete by: As directed    Diet - low sodium heart healthy   Complete by: As directed    Discharge instructions   Complete by: As directed    Please follow up with PCP in one week.   Discharge wound care:   Complete by: As directed    Dry dressing     Allergies as of 06/25/2020      Reactions   Albumin Human Other (See Comments)   Refuses all blood products as one of [employer]es      Medication List    TAKE these medications   amLODipine 5 MG tablet Commonly known as: NORVASC Take 5 mg by mouth daily.   aspirin 81 MG EC tablet Take 1 tablet (81 mg total) by mouth daily at 6 (six) AM. HOLD aspirin for 7 days and resume it after discussing with PCP. Start taking on: July 02, 2020 What changed:   additional instructions  These instructions start on July 02, 2020. If you are unsure what to do until then, ask your doctor or other care provider.   atorvastatin 40 MG tablet Commonly known  as: LIPITOR Take 40 mg by mouth at bedtime.   Dextran 70-Hypromellose 0.1-0.3 % Soln Apply 1 drop to eye every 6 (six) hours as needed for dry eyes.   Eliquis 5 MG Tabs tablet Generic drug: apixaban Take 1 tablet (5 mg total) by mouth every 12 (twelve) hours. Hold Eliquis for one week. Restart the medication after discussing with PCP. Start taking on: July 02, 2020 What changed:   additional instructions  These instructions start on July 02, 2020. If you are unsure  what to do until then, ask your doctor or other care provider.   finasteride 5 MG tablet Commonly known as: PROSCAR Take 5 mg by mouth daily.   folic acid 1 MG tablet Commonly known as: FOLVITE Take 2 mg by mouth daily at 6 (six) AM.   levETIRAcetam 500 MG tablet Commonly known as: KEPPRA Take 1 tablet (500 mg total) by mouth 2 (two) times daily for 7 days.   pyridOXINE 50 MG tablet Commonly known as: B-6 Take 50 mg by mouth daily at 6 (six) AM.   tamsulosin 0.4 MG Caps capsule Commonly known as: FLOMAX Take 0.4 mg by mouth daily.   vitamin B-12 500 MCG tablet Commonly known as: CYANOCOBALAMIN Take 500 mcg by mouth daily at 6 (six) AM.            Discharge Care Instructions  (From admission, onward)         Start     Ordered   06/25/20 0000  Discharge wound care:       Comments: Dry dressing   06/25/20 1200          Follow-up Information    Dione Housekeeperlmedo, Mario Ernesto, MD. Go on 07/02/2020.   Specialty: Family Medicine Why: 10:45am appointment Contact information: [address] [place] [id number] [phone]              Allergies  Allergen Reactions  . Albumin Human Other (See Comments)    Refuses all blood products as one of [employer]es    Consultations:  Neuro surgery by EDP   Procedures/Studies: CT HEAD WO CONTRAST  Result Date: 06/24/2020 CLINICAL DATA:  Subarachnoid hemorrhage, follow-up EXAM: CT HEAD WITHOUT CONTRAST TECHNIQUE: Contiguous axial images were obtained from the base of the skull through the vertex without intravenous contrast. COMPARISON:  06/23/2020 FINDINGS: Brain: Decreased trace right parietal convexity sulcal subarachnoid hemorrhage. No new hemorrhage. Ventricles are stable in size. Stable findings of right frontal infarct and chronic microvascular ischemic changes. No mass effect. Vascular: No new findings. Skull: Calvarium is unremarkable. Sinuses/Orbits: No acute finding. Other: None. IMPRESSION: Decreased  trace sulcal subarachnoid hemorrhage.  No new findings. Electronically Signed   By: Guadlupe SpanishPraneil  Patel M.D.   On: 06/24/2020 19:37   CT HEAD WO CONTRAST  Result Date: 06/23/2020 CLINICAL DATA:  A follow-up from subarachnoid hemorrhage EXAM: CT HEAD WITHOUT CONTRAST TECHNIQUE: Contiguous axial images were obtained from the base of the skull through the vertex without intravenous contrast. COMPARISON:  CT earlier today at 3 p.m. FINDINGS: Brain: Again noted is a tiny amount of subarachnoid hemorrhage seen overlying the right superior frontoparietal hemisphere. This is not significantly changed since the prior exam. Area encephalomalacia is again noted within the right frontal lobe. There is dilatation the ventricles and sulci consistent with age-related atrophy. Low-attenuation changes in the deep white matter consistent with small vessel ischemia. No new extra-axial collections are noted. Vascular: No hyperdense vessel or unexpected calcification. Skull: The skull is intact. No  fracture or focal lesion identified. Sinuses/Orbits: The visualized paranasal sinuses and mastoid air cells are clear. The orbits and globes intact. Other: None IMPRESSION: Stable small amount of probable subarachnoid hemorrhage overlying the right frontoparietal convexity. Findings consistent with age related atrophy and chronic small vessel ischemia Prior area of infarct with encephalomalacia involving the right frontal lobe. Electronically Signed   By: Jonna Clark M.D.   On: 06/23/2020 19:40   CT Head Wo Contrast  Result Date: 06/23/2020 CLINICAL DATA:  Facial laceration after fall. EXAM: CT HEAD WITHOUT CONTRAST CT MAXILLOFACIAL WITHOUT CONTRAST CT CERVICAL SPINE WITHOUT CONTRAST TECHNIQUE: Multidetector CT imaging of the head, cervical spine, and maxillofacial structures were performed using the standard protocol without intravenous contrast. Multiplanar CT image reconstructions of the cervical spine and maxillofacial structures were  also generated. COMPARISON:  None. FINDINGS: CT HEAD FINDINGS Brain: Mild chronic ischemic white matter disease is noted. Old right frontal infarction is noted. Probable small focus of subarachnoid hemorrhage seen in the superior portion of the right parietal cortex. Ventricular size is within normal limits. No mass effect or midline shift is noted. No acute infarction or mass lesion is noted. Vascular: No hyperdense vessel or unexpected calcification. Skull: Normal. Negative for fracture or focal lesion. Other: None. CT MAXILLOFACIAL FINDINGS Osseous: No fracture or mandibular dislocation. No destructive process. Orbits: Negative. No traumatic or inflammatory finding. Sinuses: Clear. Soft tissues: Negative. CT CERVICAL SPINE FINDINGS Alignment: Normal. Skull base and vertebrae: No acute fracture. No primary bone lesion or focal pathologic process. Soft tissues and spinal canal: No prevertebral fluid or swelling. No visible canal hematoma. Disc levels: Severe degenerative disc disease is noted at C3-4 and C5-6. Mild degenerative disc disease is noted at C4-5 and C6-7. Upper chest: Negative. Other: None. IMPRESSION: 1. Probable small focus of subarachnoid hemorrhage seen in the superior portion of the right parietal cortex. Mild chronic ischemic white matter disease. Old right frontal infarction. Critical Value/emergent results were called by telephone at the time of interpretation on 06/23/2020 at 4:02 pm to provider Dr. Scotty Court, who verbally acknowledged these results. 2. No significant abnormality seen in the maxillofacial region. 3. Multilevel degenerative disc disease. No acute abnormality seen in the cervical spine. Electronically Signed   By: Lupita Raider M.D.   On: 06/23/2020 16:02   CT Cervical Spine Wo Contrast  Result Date: 06/23/2020 CLINICAL DATA:  Facial laceration after fall. EXAM: CT HEAD WITHOUT CONTRAST CT MAXILLOFACIAL WITHOUT CONTRAST CT CERVICAL SPINE WITHOUT CONTRAST TECHNIQUE:  Multidetector CT imaging of the head, cervical spine, and maxillofacial structures were performed using the standard protocol without intravenous contrast. Multiplanar CT image reconstructions of the cervical spine and maxillofacial structures were also generated. COMPARISON:  None. FINDINGS: CT HEAD FINDINGS Brain: Mild chronic ischemic white matter disease is noted. Old right frontal infarction is noted. Probable small focus of subarachnoid hemorrhage seen in the superior portion of the right parietal cortex. Ventricular size is within normal limits. No mass effect or midline shift is noted. No acute infarction or mass lesion is noted. Vascular: No hyperdense vessel or unexpected calcification. Skull: Normal. Negative for fracture or focal lesion. Other: None. CT MAXILLOFACIAL FINDINGS Osseous: No fracture or mandibular dislocation. No destructive process. Orbits: Negative. No traumatic or inflammatory finding. Sinuses: Clear. Soft tissues: Negative. CT CERVICAL SPINE FINDINGS Alignment: Normal. Skull base and vertebrae: No acute fracture. No primary bone lesion or focal pathologic process. Soft tissues and spinal canal: No prevertebral fluid or swelling. No visible canal hematoma. Disc levels: Severe  degenerative disc disease is noted at C3-4 and C5-6. Mild degenerative disc disease is noted at C4-5 and C6-7. Upper chest: Negative. Other: None. IMPRESSION: 1. Probable small focus of subarachnoid hemorrhage seen in the superior portion of the right parietal cortex. Mild chronic ischemic white matter disease. Old right frontal infarction. Critical Value/emergent results were called by telephone at the time of interpretation on 06/23/2020 at 4:02 pm to provider Dr. Scotty Court, who verbally acknowledged these results. 2. No significant abnormality seen in the maxillofacial region. 3. Multilevel degenerative disc disease. No acute abnormality seen in the cervical spine. Electronically Signed   By: Lupita Raider M.D.    On: 06/23/2020 16:02   CT Maxillofacial Wo Contrast  Result Date: 06/23/2020 CLINICAL DATA:  Facial laceration after fall. EXAM: CT HEAD WITHOUT CONTRAST CT MAXILLOFACIAL WITHOUT CONTRAST CT CERVICAL SPINE WITHOUT CONTRAST TECHNIQUE: Multidetector CT imaging of the head, cervical spine, and maxillofacial structures were performed using the standard protocol without intravenous contrast. Multiplanar CT image reconstructions of the cervical spine and maxillofacial structures were also generated. COMPARISON:  None. FINDINGS: CT HEAD FINDINGS Brain: Mild chronic ischemic white matter disease is noted. Old right frontal infarction is noted. Probable small focus of subarachnoid hemorrhage seen in the superior portion of the right parietal cortex. Ventricular size is within normal limits. No mass effect or midline shift is noted. No acute infarction or mass lesion is noted. Vascular: No hyperdense vessel or unexpected calcification. Skull: Normal. Negative for fracture or focal lesion. Other: None. CT MAXILLOFACIAL FINDINGS Osseous: No fracture or mandibular dislocation. No destructive process. Orbits: Negative. No traumatic or inflammatory finding. Sinuses: Clear. Soft tissues: Negative. CT CERVICAL SPINE FINDINGS Alignment: Normal. Skull base and vertebrae: No acute fracture. No primary bone lesion or focal pathologic process. Soft tissues and spinal canal: No prevertebral fluid or swelling. No visible canal hematoma. Disc levels: Severe degenerative disc disease is noted at C3-4 and C5-6. Mild degenerative disc disease is noted at C4-5 and C6-7. Upper chest: Negative. Other: None. IMPRESSION: 1. Probable small focus of subarachnoid hemorrhage seen in the superior portion of the right parietal cortex. Mild chronic ischemic white matter disease. Old right frontal infarction. Critical Value/emergent results were called by telephone at the time of interpretation on 06/23/2020 at 4:02 pm to provider Dr. Scotty Court, who  verbally acknowledged these results. 2. No significant abnormality seen in the maxillofacial region. 3. Multilevel degenerative disc disease. No acute abnormality seen in the cervical spine. Electronically Signed   By: Lupita Raider M.D.   On: 06/23/2020 16:02       Subjective:  No new complaints.  Discharge Exam: Vitals:   06/25/20 0544 06/25/20 1144  BP: 112/71 123/72  Pulse: 72 71  Resp: 16 16  Temp: 98.5 F (36.9 C) 98 F (36.7 C)  SpO2: 95% 97%   Vitals:   06/24/20 1958 06/25/20 0100 06/25/20 0544 06/25/20 1144  BP: (!) 139/99 124/76 112/71 123/72  Pulse: 60 61 72 71  Resp: Temp: 98.6 F (37 C) 98.5 F (36.9 C) 98.5 F (36.9 C) 98 F (36.7 C)  TempSrc: Oral Oral Oral Oral  SpO2: 100% 98% 95% 97%  Weight:      Height:        General: Pt is alert, awake, not in acute distress Cardiovascular: RRR, S1/S2 +, no rubs, no gallops Respiratory: CTA bilaterally, no wheezing, no rhonchi Abdominal: Soft, NT, ND, bowel sounds + Extremities: no edema, no cyanosis  The results of significant diagnostics from this hospitalization (including imaging, microbiology, ancillary and laboratory) are listed below for reference.     Microbiology: Recent Results (from the past 240 hour(s))  SARS Coronavirus 2 by RT PCR (hospital order, performed in [facility] hospital lab) Nasopharyngeal Nasopharyngeal Swab     Status: None   Collection Time: 06/23/20  5:51 PM   Specimen: Nasopharyngeal Swab  Result Value Ref Range Status   SARS Coronavirus 2 NEGATIVE NEGATIVE Final    Comment: (NOTE) SARS-CoV-2 target nucleic acids are NOT DETECTED.  The SARS-CoV-2 RNA is generally detectable in upper and lower respiratory specimens during the acute phase of infection. The lowest concentration of SARS-CoV-2 viral copies this assay can detect is 250 copies / mL. A negative result does not preclude SARS-CoV-2 infection and should not be used as the sole basis for treatment or  other patient management decisions.  A negative result may occur with improper specimen collection / handling, submission of specimen other than nasopharyngeal swab, presence of viral mutation(s) within the areas targeted by this assay, and inadequate number of viral copies (<250 copies / mL). A negative result must be combined with clinical observations, patient history, and epidemiological information.  Fact Sheet for Patients:   BoilerBrush.com.cy  Fact Sheet for Healthcare Providers: https://pope.com/  This test is not yet approved or  cleared by the [place] FDA and has been authorized for detection and/or diagnosis of SARS-CoV-2 by FDA under an Emergency Use Authorization (EUA).  This EUA will remain in effect (meaning this test can be used) for the duration of the COVID-19 declaration under Section 564(b)(1) of the Act, 21 U.S.C. section 360bbb-3(b)(1), unless the authorization is terminated or revoked sooner.  Performed at [facility], [address] [place], [place] [address]      Labs: BNP (last 3 results) No results for input(s): BNP in the last 8760 hours. Basic Metabolic Panel: Recent Labs  Lab 06/23/20 1451 06/24/20 0425  NA 138 138  K 3.9 3.8  CL 106 109  CO2 26 26  GLUCOSE 122* 98  BUN 17 16  CREATININE 1.30* 1.24  CALCIUM 8.5* 8.3*   Liver Function Tests: No results for input(s): AST, ALT, ALKPHOS, BILITOT, PROT, ALBUMIN in the last 168 hours. No results for input(s): LIPASE, AMYLASE in the last 168 hours. No results for input(s): AMMONIA in the last 168 hours. CBC: Recent Labs  Lab 06/23/20 1451 06/24/20 0425  WBC 5.7 6.8  HGB 12.3* 11.8*  HCT 35.4* 35.2*  MCV 91.9 95.1  PLT 195 174   Cardiac Enzymes: No results for input(s): CKTOTAL, CKMB, CKMBINDEX, TROPONINI in the last 168 hours. BNP: Invalid input(s): POCBNP CBG: No results for input(s): GLUCAP in the last 168  hours. D-Dimer No results for input(s): DDIMER in the last 72 hours. Hgb A1c No results for input(s): HGBA1C in the last 72 hours. Lipid Profile No results for input(s): CHOL, HDL, LDLCALC, TRIG, CHOLHDL, LDLDIRECT in the last 72 hours. Thyroid function studies No results for input(s): TSH, T4TOTAL, T3FREE, THYROIDAB in the last 72 hours.  Invalid input(s): FREET3 Anemia work up No results for input(s): VITAMINB12, FOLATE, FERRITIN, TIBC, IRON, RETICCTPCT in the last 72 hours. Urinalysis No results found for: COLORURINE, APPEARANCEUR, LABSPEC, PHURINE, GLUCOSEU, HGBUR, BILIRUBINUR, KETONESUR, PROTEINUR, UROBILINOGEN, NITRITE, LEUKOCYTESUR Sepsis Labs Invalid input(s): PROCALCITONIN,  WBC,  LACTICIDVEN Microbiology Recent Results (from the past 240 hour(s))  SARS Coronavirus 2 by RT PCR (hospital order, performed in [facility] hospital lab)  Nasopharyngeal Nasopharyngeal Swab     Status: None   Collection Time: 06/23/20  5:51 PM   Specimen: Nasopharyngeal Swab  Result Value Ref Range Status   SARS Coronavirus 2 NEGATIVE NEGATIVE Final    Comment: (NOTE) SARS-CoV-2 target nucleic acids are NOT DETECTED.  The SARS-CoV-2 RNA is generally detectable in upper and lower respiratory specimens during the acute phase of infection. The lowest concentration of SARS-CoV-2 viral copies this assay can detect is 250 copies / mL. A negative result does not preclude SARS-CoV-2 infection and should not be used as the sole basis for treatment or other patient management decisions.  A negative result may occur with improper specimen collection / handling, submission of specimen other than nasopharyngeal swab, presence of viral mutation(s) within the areas targeted by this assay, and inadequate number of viral copies (<250 copies / mL). A negative result must be combined with clinical observations, patient history, and epidemiological information.  Fact Sheet for Patients:    BoilerBrush.com.cy  Fact Sheet for Healthcare Providers: https://pope.com/  This test is not yet approved or  cleared by the [place] FDA and has been authorized for detection and/or diagnosis of SARS-CoV-2 by FDA under an Emergency Use Authorization (EUA).  This EUA will remain in effect (meaning this test can be used) for the duration of the COVID-19 declaration under Section 564(b)(1) of the Act, 21 U.S.C. section 360bbb-3(b)(1), unless the authorization is terminated or revoked sooner.  Performed at [facility], [address], [place], [place] [address]      Time coordinating discharge: .   SIGNED:   Kathlen Mody, MD  [facility] 06/25/2020, 12:45 PM

## 2020-06-25 NOTE — Progress Notes (Signed)
[facility] Liaison note:  New referral for [employer] community Palliative to follow at home received from [facility]  Patient information given to referral. Plan is for d/c today. Thank you for this referral.  Dayna Barker BSN,m RN, [facility] Liaison [facility] [phone]

## 2020-06-25 NOTE — Evaluation (Signed)
Physical Therapy Evaluation Patient Details Name: Edward Campbell MRN: [id number] DOB: 1941/11/04 Today's Date: 06/25/2020   History of Present Illness  Pt admitted for subarachnoid bleed secondary to fall. History includes CVA, HTN, HLD and residual L side weakness. Pt reports he fatigued with exercising and had a syncopal event.  Clinical Impression  Pt is a pleasant 79 year old male who was admitted for subarachnoid bleed. Pt performs bed mobility/transfers with supervision and ambulation with cga/supervision and RW. Attempted ambulation performed without AD with multiple LOB noted. At this time, safer to use RW for mobility. Pt demonstrates deficits with strength (L residual chronic), mobility, and balance. Would benefit from skilled PT to address above deficits and promote optimal return to PLOF. Currently recommending OP PT at this time. Pt was previously active with OP, however due to hospitalization, will need new referral to resume services.     Follow Up Recommendations Outpatient PT    Equipment Recommendations  None recommended by PT    Recommendations for Other Services       Precautions / Restrictions Precautions Precautions: Fall Restrictions Weight Bearing Restrictions: No      Mobility  Bed Mobility Overal bed mobility: Needs Assistance Bed Mobility: Supine to Sit     Supine to sit: Supervision     General bed mobility comments: easily distracted and slow movement.  Once seated tends to demonstrate post lean with cues for correction.  Transfers Overall transfer level: Needs assistance Equipment used: Rolling walker (2 wheeled) Transfers: Sit to/from Stand Sit to Stand: Supervision         General transfer comment: cues for safety. Upright posture noted  Ambulation/Gait Ambulation/Gait assistance: Min guard;Supervision Gait Distance (Feet): 250 Feet Assistive device: Rolling walker (2 wheeled) Gait Pattern/deviations: Step-through pattern      General Gait Details: ambulating in hallway with RW. No LOB noted with steps symmetrical. Further ambulation performed without device.  Stairs            Wheelchair Mobility    Modified Rankin (Stroke Patients Only)       Balance Overall balance assessment: Needs assistance;History of Falls Sitting-balance support: Feet supported;No upper extremity supported Sitting balance-Leahy Scale: Good Sitting balance - Comments: LOB in post direction with dynamic movment   Standing balance support: Bilateral upper extremity supported Standing balance-Leahy Scale: Good Standing balance comment: with use of RW                             Pertinent Vitals/Pain Pain Assessment: No/denies pain    Home Living Family/patient expects to be discharged to:: Private residence Living Arrangements: Alone Available Help at Discharge: Friend(s) Type of Home: Apartment Home Access: Level entry     Home Layout: One level Home Equipment: Grab bars - tub/shower;Shower seat;Walker - 2 wheels;Cane - single point      Prior Function Level of Independence: Independent         Comments: reports no recent falls, likes to ambulate in community. Reports he hasn't been using AD     Hand Dominance        Extremity/Trunk Assessment   Upper Extremity Assessment Upper Extremity Assessment: Generalized weakness (L UE grossly 4/5; R UE grossly 5/5)    Lower Extremity Assessment Lower Extremity Assessment: Generalized weakness (L LE slightly postering with all toes in extension)       Communication   Communication: No difficulties  Cognition Arousal/Alertness: Awake/alert Behavior During Therapy: WFL for  tasks assessed/performed Overall Cognitive Status: Within Functional Limits for tasks assessed                                        General Comments      Exercises Other Exercises Other Exercises: further ambulation performed without AD with  unsteadiness and LOB during turns. Short steps noted and slightly high guard with B UEs.    Assessment/Plan    PT Assessment Patient needs continued PT services  PT Problem List Decreased strength;Decreased balance;Decreased mobility       PT Treatment Interventions Gait training;Therapeutic activities;Therapeutic exercise;DME instruction;Balance training    PT Goals (Current goals can be found in the Care Plan section)  Acute Rehab PT Goals Patient Stated Goal: to go home PT Goal Formulation: With patient Time For Goal Achievement: 07/09/20 Potential to Achieve Goals: Good    Frequency Min 2X/week   Barriers to discharge        Co-evaluation               AM-PAC PT "6 Clicks" Mobility  Outcome Measure Help needed turning from your back to your side while in a flat bed without using bedrails?: None Help needed moving from lying on your back to sitting on the side of a flat bed without using bedrails?: None Help needed moving to and from a bed to a chair (including a wheelchair)?: A Little Help needed standing up from a chair using your arms (e.g., wheelchair or bedside chair)?: A Little Help needed to walk in hospital room?: A Little Help needed climbing 3-5 steps with a railing? : A Little 6 Click Score: 20    End of Session Equipment Utilized During Treatment: Gait belt Activity Tolerance: Patient tolerated treatment well Patient left: in chair;with chair alarm set Nurse Communication: Mobility status PT Visit Diagnosis: Muscle weakness (generalized) (M62.81);Difficulty in walking, not elsewhere classified (R26.2);Unsteadiness on feet (R26.81)    Time: 8341-9622 PT Time Calculation (min) (ACUTE ONLY): 28 min   Charges:   PT Evaluation $PT Eval Low Complexity: 1 Low PT Treatments $Gait Training: 8-22 mins       Elizabeth Palau, PT, DPT [phone]   Edward Campbell 06/25/2020, 10:43 AM

## 2020-06-27 ENCOUNTER — Ambulatory Visit: Payer: Medicare HMO | Admitting: Occupational Therapy

## 2020-06-27 ENCOUNTER — Encounter: Payer: Self-pay | Admitting: Occupational Therapy

## 2020-06-27 ENCOUNTER — Ambulatory Visit: Payer: Medicare HMO

## 2020-06-27 ENCOUNTER — Encounter: Payer: Medicare HMO | Admitting: Speech Pathology

## 2020-06-27 ENCOUNTER — Other Ambulatory Visit: Payer: Self-pay

## 2020-06-27 DIAGNOSIS — R278 Other lack of coordination: Secondary | ICD-10-CM

## 2020-06-27 DIAGNOSIS — M6281 Muscle weakness (generalized): Secondary | ICD-10-CM

## 2020-06-27 DIAGNOSIS — R2689 Other abnormalities of gait and mobility: Secondary | ICD-10-CM | POA: Diagnosis not present

## 2020-06-27 DIAGNOSIS — R2681 Unsteadiness on feet: Secondary | ICD-10-CM

## 2020-06-27 NOTE — Therapy (Signed)
[facility] MAIN [facility] SERVICES [address] [place], [place], [address] Phone: [phone]   Fax:  [phone]  Occupational Therapy Treatment  Patient Details  Name: Edward Campbell MRN: [id number] Date of Birth: [birth year] Referring Provider (OT): Dr.  Rolin Barry   Encounter Date: 06/27/2020   OT End of Session - 06/27/20 1046    Visit Number 2    Number of Visits 24    Date for OT Re-Evaluation 09/12/20    Authorization Type FOTO    Authorization Time Period Progress report period starting 06/20/2020    OT Start Time 0930    OT Stop Time 1016    OT Time Calculation (min) 46 min    Activity Tolerance Patient tolerated treatment well    Behavior During Therapy [facility] for tasks assessed/performed           Past Medical History:  Diagnosis Date  . Stroke [facility])     Past Surgical History:  Procedure Laterality Date  . BACK SURGERY      There were no vitals filed for this visit.   Subjective Assessment - 06/27/20 0938    Subjective  Pt reports having recent fall that led to going to the ED. Since then pt reports no changes to daily ADL routines or mobility. Denies pain.    Pertinent History Pt is an 79 y/o M with PMH: CVA with acute L sided weakness, numbness and dysarthria on 4/11 and 4/28 requiring hospitalization. Other pertinent medical history: includes HTN, HLD, and DVT.    Limitations LUE strength, motor control, and [facility] skills.    Patient Stated Goals To improve coordination and ultimate goal is to drive again    Currently in Pain? No/denies           OT Tx  Neuro re-ed: Pt worked on gross grasp using resistive gripper applying 11.2# of force for LUE to grasp various length pegs and place into peg board. Pt worked on grasping and manipulating 1/2' washers from magnetized dish using a 2 point grasp pattern. Pt worked on crossing midline and stacking washers in stacks of 5 with noted difficulty with smallest size washers. Pt  worked on finger <> palm translation with picking up 3 washers one at a time and bring each one out to the finger tips and placing back in the magnetized dish. Pt demonstrated moderate difficulty performing task requiring cues for technique. Lastly, pt worked on pincer grasp and finger<>palm translation to pick up coins (quarters, nickels, dimes, pennies) and place in resistive jar at varied angles requiring repetitive shoulder flexion.   Response to Tx. Pt demonstrated mild difficulty with tasks increasing with fatigue. Pt required min-mod verbal cues for technique to perform. Pt denied pain throughout. Pt continues to be limited in computer typing which he enjoys but is able to preform with increased time and difficulty for brief periods of time. Pt reports that session today was very helpful and he is eager to take learned strategies home to perform (ex, stacking coins, filling coin sleeves, typing, etc.). Pt's current OT goals remain appropriate at this time. Pt continues to benefit from skilled OT services to maximize return to PLOF and safety with ADL/IADL tasks at home and in the community.                      OT Education - 06/27/20 0943    Education Details review of theraputty HEP, neuro re-ed, falls prevention  Person(s) Educated Patient    Methods Explanation;Demonstration    Comprehension Verbalized understanding;Returned demonstration            OT Short Term Goals - 06/20/20 1757      OT SHORT TERM GOAL #1   Title Pt will be compliant with HEP with green theraputty to increase fine motor control and independence.    Baseline 06/20/2020, exercises reviewed.    Time 4    Period Weeks    Status New    Target Date 07/18/20             OT Long Term Goals - 06/20/20 1759      OT LONG TERM GOAL #1   Title Pt will increase FOTO goal from 68 to 71 to improve performance of HH IADL tasks and overall QOL.    Baseline 7/14, FOTO score 68    Time 12    Period  Weeks    Status New    Target Date 09/12/20      OT LONG TERM GOAL #2   Title Pt. will improve left grip strength by 10# to be able to improve his ability to hold ADL objects  in his hand.    Baseline 7/14 grip strength 59.67    Time 12    Period Weeks    Status New    Target Date 09/12/20      OT LONG TERM GOAL #3   Title Pt. will improve LUE motor control in order to independently reach up to place items on a shelf.    Baseline 7/14 limited gross motor control with reaching    Time 12    Period Weeks    Status New    Target Date 09/12/20      OT LONG TERM GOAL #4   Title Pt. will improve Left hand FMC skills by 3 sec. to be able to manipulate small objects independently for ADLs.    Baseline 7/14 L hand 39 seconds (R hand 27s)    Time 12    Period Weeks    Status New    Target Date 09/12/20                 Plan - 06/27/20 1046    Clinical Impression Statement Pt demonstrated mild difficulty with tasks increasing with fatigue. Pt required min-mod verbal cues for technique to perform. Pt denied pain throughout. Pt continues to be limited in computer typing which he enjoys but is able to preform with increased time and difficulty for brief periods of time. Pt reports that session today was very helpful and he is eager to take learned strategies home to perform (ex, stacking coins, filling coin sleeves, typing, etc.). Pt's current OT goals remain appropriate at this time. Pt continues to benefit from skilled OT services to maximize return to PLOF and safety with ADL/IADL tasks at home and in the community.    OT Occupational Profile and History Detailed Assessment- Review of Records and additional review of physical, cognitive, psychosocial history related to current functional performance    Occupational performance deficits (Please refer to evaluation for details): ADL's;IADL's    Body Structure / Function / Physical Skills ADL;Balance;FMC;GMC;Coordination;IADL;Strength;UE  functional use    Rehab Potential Excellent    Clinical Decision Making Limited treatment options, no task modification necessary    Comorbidities Affecting Occupational Performance: May have comorbidities impacting occupational performance    Modification or Assistance to Complete Evaluation  Min-Moderate modification of tasks or assist  with assess necessary to complete eval    OT Frequency 2x / week    OT Duration 12 weeks    OT Treatment/Interventions Self-care/ADL training;Therapeutic exercise;Patient/family education;DME and/or AE instruction;Therapeutic activities;Neuromuscular education    Plan Therapeutic activities to enhance FMC/control, self-care re-training to modify as needed, neuromuscular education to enhance L side awareness as able, patient education to enhance safety and carryover outside of therapy, Therapeutic exercise to improve strength as it pertains to safe and efficient performance of ADLs.    Consulted and Agree with Plan of Care Patient           Patient will benefit from skilled therapeutic intervention in order to improve the following deficits and impairments:   Body Structure / Function / Physical Skills: ADL, Balance, FMC, GMC, Coordination, IADL, Strength, UE functional use       Visit Diagnosis: Other lack of coordination  Muscle weakness (generalized)    Problem List Patient Active Problem List   Diagnosis Date Noted  . Malnutrition of moderate degree 06/25/2020  . Fall   . Palliative care by specialist   . Subarachnoid hemorrhage (HCC) 06/23/2020  . HYPERCHOLESTEROLEMIA 12/04/2008  . 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 SUPERFICIAL PHLEBITIS 12/04/2008  . CHRONIC PROSTATITIS 12/04/2008  . PROSTATE SPECIFIC ANTIGEN, ELEVATED 12/04/2008    Eliezer Bottom, OTR/L 06/27/2020, 10:49 AM  [facility] MAIN [facility] SERVICES [address] [place], [place], [address] Phone:  [phone]   Fax:  [phone]  Name: Edward Campbell MRN: [id number] Date of Birth: 31-Mar-1941

## 2020-06-27 NOTE — Therapy (Signed)
[facility] MAIN [facility] SERVICES [address] [place], [place], [address] Phone: [phone]   Fax:  [phone]  Physical Therapy Re-Evaluation  Patient Details  Name: Edward Campbell MRN: [id number] Date of Birth: [birth year] No data recorded  Encounter Date: 06/27/2020   PT End of Session - 06/27/20 0945    Visit Number 1    Number of Visits 16    Date for PT Re-Evaluation 08/22/20    Authorization Type 1/10 re-eval 06/27/20    PT Start Time 0845    PT Stop Time 0930    PT Time Calculation (min) 45 min    Equipment Utilized During Treatment Gait belt    Activity Tolerance Patient tolerated treatment well    Behavior During Therapy [facility] for tasks assessed/performed           Past Medical History:  Diagnosis Date  . Stroke [facility])     Past Surgical History:  Procedure Laterality Date  . BACK SURGERY      There were no vitals filed for this visit.    Subjective Assessment - 06/27/20 0901    Subjective Patient returning to PT after hospitalization    Pertinent History Patient returning to PT after hospitalization 7/17-7/19. Was walking in the heat and fainted, bruising throughout body on L side.  Hasn't gone walking since returned home from hospitalization due to weakness. hx of prior R MCA stroke, HTN, HLD, and DVT. He had two ischemic strokes in <3 weeks (4/11 and 4/28)  with acute left arm weakness, numbness and dysarthria. Imaging on 4/12 positive for PFO with an WF > 55%, mild LVH and no thrombus. He also has an atrial septal aneurysm PMh includes  He had a driving safety test on 9/50/93 but was not cleared to return to driving yet. Has meals on wheels to help with lunch. Apartment complex has a gym    Limitations Lifting;Standing;Walking;House hold activities    How long can you sit comfortably? n/a    How long can you stand comfortably? not sure    How long can you walk comfortably? unsteady now outside of house.    Patient Stated  Goals to walk better and be more steady    Currently in Pain? No/denies         Orthostatics:  Seated: 102/80  Standing 104/78 Standing after 3 minutes: 114/81    BP after 6 MWT: 120/67    [facility] PT Assessment - 06/27/20 0001      Assessment   Medical Diagnosis CVA (P)     Referring Provider (PT) Chancy, Stephanie (P)     Onset Date/Surgical Date 03/18/20 (P)     Hand Dominance Right (P)     Next MD Visit 04/02/2020 (P)       Precautions   Precautions None (P)       Restrictions   Weight Bearing Restrictions No (P)       Balance Screen   Has the patient fallen in the past 6 months Yes (P)       Berg Balance Test   Sit to Stand Able to stand without using hands and stabilize independently    Standing Unsupported Able to stand 2 minutes with supervision    Sitting with Back Unsupported but Feet Supported on Floor or Stool Able to sit safely and securely 2 minutes    Stand to Sit Sits safely with minimal use of hands    Transfers Able to transfer safely, definite  need of hands    Standing Unsupported with Eyes Closed Able to stand 10 seconds with supervision    Standing Unsupported with Feet Together Able to place feet together independently and stand for 1 minute with supervision    From Standing, Reach Forward with Outstretched Arm Can reach forward >12 cm safely (5")    From Standing Position, Pick up Object from Floor Able to pick up shoe, needs supervision    From Standing Position, Turn to Look Behind Over each Shoulder Turn sideways only but maintains balance    Turn 360 Degrees Needs close supervision or verbal cueing    Standing Unsupported, Alternately Place Feet on Step/Stool Able to complete >2 steps/needs minimal assist    Standing Unsupported, One Foot in Front Needs help to step but can hold 15 seconds    Standing on One Leg Able to lift leg independently and hold equal to or more than 3 seconds    Total Score 37             PAIN: Pain in the jaw when he  chews.   POSTURE: Seated Left lean, hips shifted to the right  Standing: slight knee bent/crouch with foot internal rotation bilaterally.   PROM/AROM: Limited hip flexors bilaterally  STRENGTH:  Graded on a 0-5 scale Muscle Group Left Right  Hip Flex 4-/5 4/5  Hip Abd 3/5 4-/5  Hip Add 3/5 4-/5  Hip Ext 3/5 4-/5  Hip IR/ER 3+/5 4-/5  Knee Flex 3/5 4-/5  Knee Ext 3+/5 4-/5  Ankle DF 3/5 4-/5  Ankle PF 3/5 4-/5   SENSATION:   BLE :           Sensation           Intact      Diminished         Absent  Light touch RLE Diminished LLE                              COORDINATION: Heel shin test: Dysmetric    FUNCTIONAL MOBILITY: STS: weight shift onto RLE, slight knee flexion   BALANCE: Dynamic Sitting Balance  Normal Able to sit unsupported and weight shift across midline maximally   Good Able to sit unsupported and weight shift across midline moderately   Good-/Fair+ Able to sit unsupported and weight shift across midline minimally   Fair Minimal weight shifting ipsilateral/front, difficulty crossing midline x  Fair- Reach to ipsilateral side and unable to weight shift   Poor + Able to sit unsupported with min A and reach to ipsilateral side, unable to weight shift   Poor Able to sit unsupported with mod A and reach ipsilateral/front-can't cross midline     Standing Dynamic Balance  Normal Stand independently unsupported, able to weight shift and cross midline maximally   Good Stand independently unsupported, able to weight shift and cross midline moderately   Good-/Fair+ Stand independently unsupported, able to weight shift across midline minimally   Fair Stand independently unsupported, weight shift, and reach ipsilaterally, loss of balance when crossing midline x  Poor+ Able to stand with Min A and reach ipsilaterally, unable to weight shift   Poor Able to stand with Mod A and minimally reach ipsilaterally, unable to cross midline.     Static Sitting Balance   Normal Able to maintain balance against maximal resistance   Good Able to maintain balance against moderate resistance   Good-/Fair+ Accepts minimal  resistance x  Fair Able to sit unsupported without balance loss and without UE support   Poor+ Able to maintain with Minimal assistance from individual or chair   Poor Unable to maintain balance-requires mod/max support from individual or chair     Static Standing Balance  Normal Able to maintain standing balance against maximal resistance   Good Able to maintain standing balance against moderate resistance   Good-/Fair+ Able to maintain standing balance against minimal resistance   Fair Able to stand unsupported without UE support and without LOB for 1-2 min x  Fair- Requires Min A and UE support to maintain standing without loss of balance   Poor+ Requires mod A and UE support to maintain standing without loss of balance   Poor Requires max A and UE support to maintain standing balance without loss       GAIT: Patient ambulates with left shoulder depressed and arm hanging at his side, foot internal rotation and drag with scissor patterning   OUTCOME MEASURES: TEST Outcome Interpretation  5 times sit<>stand 20.31 sec >60 yo, >15 sec indicates increased risk for falls  FOTO 55.3%  Discharge predicted score of 71       6 minute walk test         965       Feet 1000 feet is community Financial controller 37/56  <36/56 (100% risk for falls), 37-45 (80% risk for falls); 46-51 (>50% risk for falls); 52-55 (lower risk <25% of falls)         Objective measurements completed on examination: See above findings.               PT Education - 06/27/20 0945    Education Details re-cert goals, POC    Person(s) Educated Patient    Methods Explanation;Demonstration;Tactile cues;Verbal cues    Comprehension Verbalized understanding;Returned demonstration;Verbal cues required;Tactile cues required            PT  Short Term Goals - 06/27/20 1009      PT SHORT TERM GOAL #1   Title Patient will be independent in home exercise program to improve strength/mobility for better functional independence with ADLs    Baseline 7/1 HEP given    Time 2    Period Weeks    Status New    Target Date 07/11/20             PT Long Term Goals - 06/27/20 1009      PT LONG TERM GOAL #1   Title Patient will increase FOTO score to equal to or greater than 71 %   to demonstrate statistically significant improvement in mobility and quality of life.    Baseline 7/1: 63% 7/21: RECERT: 55.3%    Time 8    Period Weeks    Status New    Target Date 08/22/20      PT LONG TERM GOAL #2   Title Patient will demonstrate an improved Berg Balance Score of >47/56 as to demonstrate improved balance with ADLs such as sitting/standing and transfer balance and reduced fall risk.    Baseline 7/1: 41/56 7/21: RECERT 37/56    Time 8    Period Weeks    Status New    Target Date 08/22/20      PT LONG TERM GOAL #3   Title Patient will increase six minute walk test distance to >1500 for progression to community ambulator age norm and improve gait ability    Baseline 7/1:  1159 ft with excessive foot drag; 7/21: RECERT 965 ft    Time 8    Period Weeks    Status New    Target Date 08/22/20      PT LONG TERM GOAL #4   Title Patient will increase BLE gross strength to 4+/5 as to improve functional strength for independent gait, increased standing tolerance and increased ADL ability.    Baseline 7/1: see chart 7/21: RECERT see note    Time 8    Period Weeks    Status New    Target Date 08/22/20      PT LONG TERM GOAL #5   Title Patient will safely negotiate obstacles in a crowded room without running into object on his left, with good foot clearance, and arm swing    Baseline 7/1: unable to 7/21: recert: unable    Time 8    Period Weeks    Status New    Target Date 08/22/20                  Plan - 06/27/20 1008     Clinical Impression Statement Patient is a pleasant 79 year old man who presents for re-eval s/p hospitalization for syncopal event. Patient's stability and strength have decreased since his evaluation as well as his FOTO score decreasing to 55.3%. His ability to transfer is additionally decreased with increased time required. Orthostatic taken with patient demonstrating low systolic pressure that increases over prolonged standing. Patient will benefit from skilled physical therapy to increase strength, ambulatory mechanics, balance, and coordination for decreased fall risk and return to PLOF    Personal Factors and Comorbidities Age;Comorbidity 3+;Finances;Past/Current Experience;Transportation    Comorbidities stroke, HLD, lumbar radiculopathy, hypercholesterolemia, restless legs syndrome, raynaud's syndrome, superficial phlebitis, chronic prostatis,    Examination-Activity Limitations Bend;Carry;Hygiene/Grooming;Locomotion Level;Squat;Stairs;Stand;Transfers    Examination-Participation Restrictions Church;Cleaning;Community Activity;Driving;Volunteer;Shop;Yard Work    Advertising account planner    Rehab Potential Fair    PT Frequency 2x / week    PT Duration 8 weeks    PT Treatment/Interventions ADLs/Self Care Home Management;Aquatic Therapy;Biofeedback;Canalith Repostioning;Cryotherapy;Clinical cytogeneticist;Iontophoresis /ml Dexamethasone;Moist Heat;Ultrasound;Therapeutic exercise;Therapeutic activities;Functional mobility training;Stair training;DME Instruction;Balance training;Neuromuscular re-education;Patient/family education;Orthotic Fit/Training;Manual techniques;Passive range of motion;Dry needling;Energy conservation;Splinting;Taping;Vestibular;Vasopneumatic Device;Manual lymph drainage    PT Next Visit Plan ankle stability exercises, spatial awareness for L side, dual task.    PT Home Exercise Plan see above    Consulted and Agree with  Plan of Care Patient           Patient will benefit from skilled therapeutic intervention in order to improve the following deficits and impairments:  Abnormal gait, Decreased activity tolerance, Decreased balance, Decreased endurance, Decreased coordination, Decreased cognition, Decreased knowledge of use of DME, Decreased mobility, Decreased safety awareness, Difficulty walking, Decreased strength, Impaired flexibility, Impaired perceived functional ability, Impaired UE functional use, Impaired vision/preception, Postural dysfunction, Improper body mechanics  Visit Diagnosis: Other lack of coordination  Muscle weakness (generalized)  Other abnormalities of gait and mobility  Unsteadiness on feet     Problem List Patient Active Problem List   Diagnosis Date Noted  . Malnutrition of moderate degree 06/25/2020  . Fall   . Palliative care by specialist   . Subarachnoid hemorrhage (HCC) 06/23/2020  . HYPERCHOLESTEROLEMIA 12/04/2008  . 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 SUPERFICIAL PHLEBITIS 12/04/2008  . CHRONIC PROSTATITIS 12/04/2008  . PROSTATE SPECIFIC ANTIGEN, ELEVATED 12/04/2008   Precious Bard, PT, DPT   06/27/2020, 10:12 AM  [place]  [facility] MAIN [facility] SERVICES [address] [place], [place], [address] Phone: [phone]   Fax:  [phone]  Name: Edward Campbell MRN: [id number] Date of Birth: 01-May-1941

## 2020-07-02 ENCOUNTER — Other Ambulatory Visit: Payer: Self-pay

## 2020-07-02 ENCOUNTER — Encounter: Payer: Self-pay | Admitting: Occupational Therapy

## 2020-07-02 ENCOUNTER — Encounter: Payer: Medicare HMO | Admitting: Occupational Therapy

## 2020-07-02 ENCOUNTER — Encounter: Payer: Medicare HMO | Admitting: Speech Pathology

## 2020-07-02 ENCOUNTER — Ambulatory Visit: Payer: Medicare HMO | Admitting: Occupational Therapy

## 2020-07-02 DIAGNOSIS — R2689 Other abnormalities of gait and mobility: Secondary | ICD-10-CM | POA: Diagnosis not present

## 2020-07-02 DIAGNOSIS — R41841 Cognitive communication deficit: Secondary | ICD-10-CM

## 2020-07-02 DIAGNOSIS — R2681 Unsteadiness on feet: Secondary | ICD-10-CM

## 2020-07-02 DIAGNOSIS — R278 Other lack of coordination: Secondary | ICD-10-CM

## 2020-07-02 DIAGNOSIS — M6281 Muscle weakness (generalized): Secondary | ICD-10-CM

## 2020-07-02 NOTE — Therapy (Signed)
[facility] MAIN [facility] SERVICES [address] [place], [place], [address] Phone: [phone]   Fax:  [phone]  Occupational Therapy Treatment  Patient Details  Name: Edward Campbell MRN: [id number] Date of Birth: 05-02-1941 Referring Provider (OT): Dr.  Rolin Barry   Encounter Date: 07/02/2020   OT End of Session - 07/02/20 2127    Visit Number 3    Number of Visits 24    Date for OT Re-Evaluation 09/12/20    Authorization Type FOTO    Authorization Time Period Progress report period starting 06/20/2020    OT Start Time 1302    OT Stop Time 1345    OT Time Calculation (min) 43 min    Activity Tolerance Patient tolerated treatment well    Behavior During Therapy [facility] for tasks assessed/performed           Past Medical History:  Diagnosis Date  . Stroke [facility])     Past Surgical History:  Procedure Laterality Date  . BACK SURGERY      There were no vitals filed for this visit.   Subjective Assessment - 07/02/20 2129    Subjective  Patient reports he is doing better, has been working on a spreadsheet for his finances.    Pertinent History Pt is an 79 y/o M with PMH: CVA with acute L sided weakness, numbness and dysarthria on 4/11 and 4/28 requiring hospitalization. Other pertinent medical history: includes HTN, HLD, and DVT.    Limitations LUE strength, motor control, and [facility] skills.    Patient Stated Goals To improve coordination and ultimate goal is to drive again          Patient reports he has been working on a spreadsheet to help with paying bills.  Feels it is working out well, it automatically computes his balance for him and then he matches it with his bank.    Patient seen for UB strengthening tasks Grip strength with use of hand gripper for 2 sets, 3rd setting with cues for hand placement on gripper and sustained gripping patterns.    Neuromuscular Reeducation: Fine motor coordination skills with use of Purdue pegboard  with manipulation of small dowels, washers and collars.  Difficulty at times separating small washers from each other in a stack. Using hand for storage and translatory skills of the hand with mild difficulty.    Picking up toothpicks with left hand and placing into small holed container with cues for prehension patterns.  Response to tx: Patient able to complete 2 sets of grip strengthening with 17#, unable to progress to 4th setting for repetitions of sustained gripping patterns.  Patient demonstrates difficulty with manipulation of small objects less than 1/2 inch in size.  Patient able to demonstrate ability to perform translatory skills of the hand and using the hand for storage but lacks speed and dexterity.  Patient instructed on home program with fine motor coordination skills to work on for the next few days.                         OT Short Term Goals - 06/20/20 1757      OT SHORT TERM GOAL #1   Title Pt will be compliant with HEP with green theraputty to increase fine motor control and independence.    Baseline 06/20/2020, exercises reviewed.    Time 4    Period Weeks    Status New    Target Date 07/18/20  OT Long Term Goals - 06/20/20 1759      OT LONG TERM GOAL #1   Title Pt will increase FOTO goal from 68 to 71 to improve performance of HH IADL tasks and overall QOL.    Baseline 7/14, FOTO score 68    Time 12    Period Weeks    Status New    Target Date 09/12/20      OT LONG TERM GOAL #2   Title Pt. will improve left grip strength by 10# to be able to improve his ability to hold ADL objects  in his hand.    Baseline 7/14 grip strength 59.67    Time 12    Period Weeks    Status New    Target Date 09/12/20      OT LONG TERM GOAL #3   Title Pt. will improve LUE motor control in order to independently reach up to place items on a shelf.    Baseline 7/14 limited gross motor control with reaching    Time 12    Period Weeks    Status  New    Target Date 09/12/20      OT LONG TERM GOAL #4   Title Pt. will improve Left hand FMC skills by 3 sec. to be able to manipulate small objects independently for ADLs.    Baseline 7/14 L hand 39 seconds (R hand 27s)    Time 12    Period Weeks    Status New    Target Date 09/12/20                 Plan - 07/02/20 2128    Clinical Impression Statement Patient able to complete 2 sets of grip strengthening with 17#, unable to progress to 4th setting for repetitions of sustained gripping patterns.  Patient demonstrates difficulty with manipulation of small objects less than 1/2 inch in size.  Patient able to demonstrate ability to perform translatory skills of the hand and using the hand for storage but lacks speed and dexterity.  Patient instructed on home program with fine motor coordination skills to work on for the next few days.    OT Occupational Profile and History Detailed Assessment- Review of Records and additional review of physical, cognitive, psychosocial history related to current functional performance    Occupational performance deficits (Please refer to evaluation for details): ADL's;IADL's    Body Structure / Function / Physical Skills ADL;Balance;FMC;GMC;Coordination;IADL;Strength;UE functional use    Rehab Potential Excellent    Clinical Decision Making Limited treatment options, no task modification necessary    Comorbidities Affecting Occupational Performance: May have comorbidities impacting occupational performance    Modification or Assistance to Complete Evaluation  Min-Moderate modification of tasks or assist with assess necessary to complete eval    OT Frequency 2x / week    OT Duration 12 weeks    OT Treatment/Interventions Self-care/ADL training;Therapeutic exercise;Patient/family education;DME and/or AE instruction;Therapeutic activities;Neuromuscular education    Plan Therapeutic activities to enhance FMC/control, self-care re-training to modify as  needed, neuromuscular education to enhance L side awareness as able, patient education to enhance safety and carryover outside of therapy, Therapeutic exercise to improve strength as it pertains to safe and efficient performance of ADLs.    Consulted and Agree with Plan of Care Patient           Patient will benefit from skilled therapeutic intervention in order to improve the following deficits and impairments:   Body Structure / Function / Physical  Skills: ADL, Balance, FMC, GMC, Coordination, IADL, Strength, UE functional use       Visit Diagnosis: Muscle weakness (generalized)  Other lack of coordination  Cognitive communication deficit  Unsteadiness on feet    Problem List Patient Active Problem List   Diagnosis Date Noted  . Malnutrition of moderate degree 06/25/2020  . Fall   . Palliative care by specialist   . Subarachnoid hemorrhage (HCC) 06/23/2020  . HYPERCHOLESTEROLEMIA 12/04/2008  . 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 SUPERFICIAL PHLEBITIS 12/04/2008  . CHRONIC PROSTATITIS 12/04/2008  . PROSTATE SPECIFIC ANTIGEN, ELEVATED 12/04/2008   Johannes Everage T Arne Cleveland, OTR/L, CLT  Travelle Mcclimans 07/03/2020, 9:30 PM  [place] [facility] MAIN [facility] SERVICES [address] [place], [place], [address] Phone: [phone]   Fax:  [phone]  Name: Edward Campbell MRN: [id number] Date of Birth: May 03, 1941

## 2020-07-05 ENCOUNTER — Encounter: Payer: Medicare HMO | Admitting: Occupational Therapy

## 2020-07-05 ENCOUNTER — Ambulatory Visit: Payer: Medicare HMO | Admitting: Occupational Therapy

## 2020-07-05 ENCOUNTER — Encounter: Payer: Medicare HMO | Admitting: Speech Pathology

## 2020-07-10 ENCOUNTER — Ambulatory Visit: Payer: Medicare HMO

## 2020-07-10 ENCOUNTER — Ambulatory Visit: Payer: Medicare HMO | Attending: Gerontology | Admitting: Occupational Therapy

## 2020-07-10 ENCOUNTER — Encounter: Payer: Self-pay | Admitting: Occupational Therapy

## 2020-07-10 ENCOUNTER — Other Ambulatory Visit: Payer: Self-pay

## 2020-07-10 VITALS — BP 140/77 | HR 69

## 2020-07-10 DIAGNOSIS — R2681 Unsteadiness on feet: Secondary | ICD-10-CM | POA: Diagnosis present

## 2020-07-10 DIAGNOSIS — R41841 Cognitive communication deficit: Secondary | ICD-10-CM | POA: Insufficient documentation

## 2020-07-10 DIAGNOSIS — M6281 Muscle weakness (generalized): Secondary | ICD-10-CM

## 2020-07-10 DIAGNOSIS — R278 Other lack of coordination: Secondary | ICD-10-CM | POA: Insufficient documentation

## 2020-07-10 NOTE — Therapy (Signed)
[place] [facility] MAIN [facility] SERVICES [address] [place], [place], [address] Phone: [phone]   Fax:  [phone]  Physical Therapy Treatment  Patient Details  Name: Edward Campbell MRN: [id number] Date of Birth: 14-Sep-1941 Referring Provider (PT): Virgel Gess   Encounter Date: 07/10/2020   PT End of Session - 07/10/20 1544    Visit Number 2    Number of Visits 16    Date for PT Re-Evaluation 08/22/20    Authorization Type 1/10 re-eval 06/27/20    PT Start Time 1600    PT Stop Time 1645    PT Time Calculation (min) 45 min    Equipment Utilized During Treatment Gait belt    Activity Tolerance Patient tolerated treatment well    Behavior During Therapy [facility] for tasks assessed/performed           Past Medical History:  Diagnosis Date   Stroke [facility])     Past Surgical History:  Procedure Laterality Date   BACK SURGERY      Vitals:   07/10/20 1603  BP: 140/77  Pulse: 69  SpO2: 97%     Subjective Assessment - 07/10/20 1543    Subjective Pt reports that he is doing well today. He reports no major changes in healh since last therapy session. He states that he was taken off the Eliquis and advised to take an ASA instead. He also was taken off the Keppra. No specific question or concerns at this time.    Pertinent History Patient returning to PT after hospitalization 7/17-7/19. Was walking in the heat and fainted, bruising throughout body on L side.  Hasn't gone walking since returned home from hospitalization due to weakness. hx of prior R MCA stroke, HTN, HLD, and DVT. He had two ischemic strokes in <3 weeks (4/11 and 4/28)  with acute left arm weakness, numbness and dysarthria. Imaging on 4/12 positive for PFO with an WF > 55%, mild LVH and no thrombus. He also has an atrial septal aneurysm PMh includes  He had a driving safety test on 0/16/01 but was not cleared to return to driving yet. Has meals on wheels to help with lunch. Apartment  complex has a gym    Limitations Lifting;Standing;Walking;House hold activities    How long can you sit comfortably? n/a    How long can you stand comfortably? not sure    How long can you walk comfortably? unsteady now outside of house.    Patient Stated Goals to walk better and be more steady    Currently in Pain? No/denies               TREATMENT   Ther-ex  NuStep L2-3 x 4 minutes for warm-up during history; Precor single leg press 45# 2 x 10 BLE; Heel/toe raises with BUE support x 10 each; Standing exercises with 3# ankle weights (AW): Hip flexion marches x 10 BLE; Hip abduction x 10 BLE;  HS curls x 10 BLE; Hip extension x 10 BLE;   Neuromuscular Re-education  Performed in // bars: 6" orange hurdle forward/backward step-over without UE support x 10 BLE; Right and left 1/2 foam roller step-over without UE support x 10 each direction; Airex alternating 6" step taps without UE support x 10 BLE; Airex alternating cone taps without UE support with therapist calling out color and laterality x multiple bouts on each side; Reviewed HEP with patient and added tandem balance;   Pt educated throughout session about proper posture and  technique with exercises. Improved exercise technique, movement at target joints, use of target muscles after min to mod verbal, visual, tactile cues.    Pt demonstrates excellent motivation during session today. Performed both balance and strength exercises with patient today. Initiated leg press with patient as well as standing resisted exercises with ankle weights. Pt encouraged to continue his HEP and follow-up as scheduled. Added tandem balance to HEP. Pt will benefit from PT services to address deficits in strength, balance, and mobility in order to return to full function at home.                      PT Short Term Goals - 06/27/20 1009      PT SHORT TERM GOAL #1   Title Patient will be independent in home exercise  program to improve strength/mobility for better functional independence with ADLs    Baseline 7/1 HEP given    Time 2    Period Weeks    Status New    Target Date 07/11/20             PT Long Term Goals - 06/27/20 1009      PT LONG TERM GOAL #1   Title Patient will increase FOTO score to equal to or greater than 71 %   to demonstrate statistically significant improvement in mobility and quality of life.    Baseline 7/1: 63% 7/21: RECERT: 55.3%    Time 8    Period Weeks    Status New    Target Date 08/22/20      PT LONG TERM GOAL #2   Title Patient will demonstrate an improved Berg Balance Score of >47/56 as to demonstrate improved balance with ADLs such as sitting/standing and transfer balance and reduced fall risk.    Baseline 7/1: 41/56 7/21: RECERT 37/56    Time 8    Period Weeks    Status New    Target Date 08/22/20      PT LONG TERM GOAL #3   Title Patient will increase six minute walk test distance to >1500 for progression to community ambulator age norm and improve gait ability    Baseline 7/1: 1159 ft with excessive foot drag; 7/21: RECERT 965 ft    Time 8    Period Weeks    Status New    Target Date 08/22/20      PT LONG TERM GOAL #4   Title Patient will increase BLE gross strength to 4+/5 as to improve functional strength for independent gait, increased standing tolerance and increased ADL ability.    Baseline 7/1: see chart 7/21: RECERT see note    Time 8    Period Weeks    Status New    Target Date 08/22/20      PT LONG TERM GOAL #5   Title Patient will safely negotiate obstacles in a crowded room without running into object on his left, with good foot clearance, and arm swing    Baseline 7/1: unable to 7/21: recert: unable    Time 8    Period Weeks    Status New    Target Date 08/22/20                 Plan - 07/10/20 1545    Personal Factors and Comorbidities Age;Comorbidity 3+;Finances;Past/Current Experience;Transportation     Comorbidities stroke, HLD, lumbar radiculopathy, hypercholesterolemia, restless legs syndrome, raynaud's syndrome, superficial phlebitis, chronic prostatis,    Examination-Activity Limitations Bend;Carry;Hygiene/Grooming;Locomotion Level;Squat;Stairs;Stand;Transfers  Examination-Participation Restrictions Church;Cleaning;Community Activity;Driving;Volunteer;Shop;Yard Work    Advertising account planner    Rehab Potential Fair    PT Frequency 2x / week    PT Duration 8 weeks    PT Treatment/Interventions ADLs/Self Care Home Management;Aquatic Therapy;Biofeedback;Canalith Repostioning;Cryotherapy;Clinical cytogeneticist;Iontophoresis 4mg /ml Dexamethasone;Moist Heat;Ultrasound;Therapeutic exercise;Therapeutic activities;Functional mobility training;Stair training;DME Instruction;Balance training;Neuromuscular re-education;Patient/family education;Orthotic Fit/Training;Manual techniques;Passive range of motion;Dry needling;Energy conservation;Splinting;Taping;Vestibular;Vasopneumatic Device;Manual lymph drainage    PT Next Visit Plan ankle stability exercises, spatial awareness for L side, dual task.    PT Home Exercise Plan see above    Consulted and Agree with Plan of Care Patient           Patient will benefit from skilled therapeutic intervention in order to improve the following deficits and impairments:  Abnormal gait, Decreased activity tolerance, Decreased balance, Decreased endurance, Decreased coordination, Decreased cognition, Decreased knowledge of use of DME, Decreased mobility, Decreased safety awareness, Difficulty walking, Decreased strength, Impaired flexibility, Impaired perceived functional ability, Impaired UE functional use, Impaired vision/preception, Postural dysfunction, Improper body mechanics  Visit Diagnosis: Muscle weakness (generalized)  Unsteadiness on feet     Problem List Patient Active Problem List   Diagnosis  Date Noted   Malnutrition of moderate degree 06/25/2020   Fall    Palliative care by specialist    Subarachnoid hemorrhage (HCC) 06/23/2020   HYPERCHOLESTEROLEMIA 12/04/2008   RESTLESS LEGS SYNDROME 12/04/2008   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 [place], [place], [place] Phone: [phone]   Fax:  [phone]  Name: Edward Campbell MRN: Dortha Kern Date of Birth: Mar 15, 1941

## 2020-07-10 NOTE — Therapy (Signed)
[place] [facility] MAIN [facility] SERVICES [address] [place], [place], [address] Phone: [phone]   Fax:  [phone]  Occupational Therapy Treatment  Patient Details  Name: Edward Campbell MRN: [id number] Date of Birth: Oct 13, 1941 Referring Provider (OT): Dr.  Rolin Barry   Encounter Date: 07/10/2020    Past Medical History:  Diagnosis Date  . Stroke [facility])     Past Surgical History:  Procedure Laterality Date  . BACK SURGERY      There were no vitals filed for this visit.     Patient seen for resistive pinch skills with all levels from yellow to black to pinch with left hand and place onto stick in elevated plane of motion to promote reaching.  Multiple reps and trials completed to place and remove.    Digital gripper 55#, 49, 47, 49, 46  Bulletin board with placing push pins in with moderate resistance.  Able to complete but some difficulty with repetition.  Cues for prehension patterns and to push pins all the way in the board.  Manipulation of [place] discs unilaterally with left hand with cues for turning and flipping with use of isolated finger movements. Then worked towards bilateral hand use with attempts at turning pieces simultaneously, moderate difficulty to coordinate simultaneous movements, tended to move one at a time in a series. Verbal cues and therapist demonstration required.    Response to tx: Patient continues to progress with strength in hand/digits as well as coordination and manipulation skills.  Patient demonstrates some difficulty with placing push pins into moderate resistive board.  Increased difficulty with fine motor coordination skills and manipulation skills with turning and flipping [place] discs, decreased isolated finger movements in index for turning.  Responds well to verbal cues and therapist demo.  Continue to work towards goals in plan of care to maximize safety and independence in daily tasks.                    OT Short Term Goals - 06/20/20 1757      OT SHORT TERM GOAL #1   Title Pt will be compliant with HEP with green theraputty to increase fine motor control and independence.    Baseline 06/20/2020, exercises reviewed.    Time 4    Period Weeks    Status New    Target Date 07/18/20             OT Long Term Goals - 06/20/20 1759      OT LONG TERM GOAL #1   Title Pt will increase FOTO goal from 68 to 71 to improve performance of HH IADL tasks and overall QOL.    Baseline 7/14, FOTO score 68    Time 12    Period Weeks    Status New    Target Date 09/12/20      OT LONG TERM GOAL #2   Title Pt. will improve left grip strength by 10# to be able to improve his ability to hold ADL objects  in his hand.    Baseline 7/14 grip strength 59.67    Time 12    Period Weeks    Status New    Target Date 09/12/20      OT LONG TERM GOAL #3   Title Pt. will improve LUE motor control in order to independently reach up to place items on a shelf.    Baseline 7/14 limited gross motor control with reaching    Time 12  Period Weeks    Status New    Target Date 09/12/20      OT LONG TERM GOAL #4   Title Pt. will improve Left hand FMC skills by 3 sec. to be able to manipulate small objects independently for ADLs.    Baseline 7/14 L hand 39 seconds (R hand 27s)    Time 12    Period Weeks    Status New    Target Date 09/12/20                  Patient will benefit from skilled therapeutic intervention in order to improve the following deficits and impairments:   Body Structure / Function / Physical Skills: ADL, Balance, FMC, GMC, Coordination, IADL, Strength, UE functional use       Visit Diagnosis: Muscle weakness (generalized)  Other lack of coordination  Cognitive communication deficit  Unsteadiness on feet    Problem List Patient Active Problem List   Diagnosis Date Noted  . Malnutrition of moderate degree 06/25/2020  . Fall   .  Palliative care by specialist   . Subarachnoid hemorrhage (HCC) 06/23/2020  . HYPERCHOLESTEROLEMIA 12/04/2008  . 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 SUPERFICIAL PHLEBITIS 12/04/2008  . CHRONIC PROSTATITIS 12/04/2008  . PROSTATE SPECIFIC ANTIGEN, ELEVATED 12/04/2008   Dailyn Kempner T Arne Cleveland, OTR/L, CLT  Larry Knipp 07/12/2020, 9:04 PM  [place] [facility] MAIN [facility] SERVICES [address] [place], [place], [address] Phone: [phone]   Fax:  [phone]  Name: Edward Campbell MRN: [id number] Date of Birth: [birth year]

## 2020-07-16 ENCOUNTER — Ambulatory Visit: Payer: Medicare HMO | Admitting: Occupational Therapy

## 2020-07-16 ENCOUNTER — Encounter: Payer: Self-pay | Admitting: Occupational Therapy

## 2020-07-16 ENCOUNTER — Other Ambulatory Visit: Payer: Self-pay

## 2020-07-16 DIAGNOSIS — M6281 Muscle weakness (generalized): Secondary | ICD-10-CM

## 2020-07-16 DIAGNOSIS — R278 Other lack of coordination: Secondary | ICD-10-CM

## 2020-07-16 NOTE — Therapy (Signed)
[place] [facility] MAIN [facility] SERVICES [address] [place], [place], [address] Phone: [phone]   Fax:  [phone]  Occupational Therapy Treatment  Patient Details  Name: Edward Campbell MRN: [id number] Date of Birth: May 17, 1941 Referring Provider (OT): Dr.  Rolin Barry   Encounter Date: 07/16/2020   OT End of Session - 07/16/20 1306    Visit Number 5    Number of Visits 24    Date for OT Re-Evaluation 09/12/20    Authorization Type FOTO    Authorization Time Period Progress report period starting 06/20/2020    OT Start Time 1302    OT Stop Time 1345    OT Time Calculation (min) 43 min    Activity Tolerance Patient tolerated treatment well    Behavior During Therapy [facility] for tasks assessed/performed           Past Medical History:  Diagnosis Date   Stroke [facility])     Past Surgical History:  Procedure Laterality Date   BACK SURGERY      There were no vitals filed for this visit.   Subjective Assessment - 07/16/20 1306    Subjective  Pt. reports doing well today.    Pertinent History Pt is an 79 y/o M with PMH: CVA with acute L sided weakness, numbness and dysarthria on 4/11 and 4/28 requiring hospitalization. Other pertinent medical history: includes HTN, HLD, and DVT.    Limitations LUE strength, motor control, and [facility] skills.    Special Tests Vision: scanning in tact, peripheral vision in tact in all 4 quadrants, appropriate tracking and convergence.    Patient Stated Goals To improve coordination and ultimate goal is to drive again    Currently in Pain? No/denies          OT TREATMENT    Neuro muscular re-education:  Pt. worked on left hand [facility] skills Pt. worked on grasping 1" resistive cubes alternating thumb opposition to the tip of the 2nd through 5th digits while the board is placed at a vertical angle. Pt. worked on pressing the cubes back into place while alternating isolated 2nd through 5th digit extension. Pt. worked on  grasping, and manipulating 1/2" washers from a magnetic dish using a 2 point grasp pattern. Pt. worked on reaching up, stabilizing, and sustaining shoulder elevation while placing the washer over a small precise target on vertical dowels positioned at various angles.    Therapeutic Exercise:  Pt. worked on pinch strengthening in the left hand for lateral, and 3pt. pinch using yellow, red, green, blue, and black resistive clips. Pt. worked on placing the clips at various vertical and horizontal angles. Tactile and verbal cues were required for eliciting the desired movement. Pt. Worked on left hand gross grip strengthening using [employer] Dynamometer: Left: 56.6#, 54#, 52#, 48.8#  Pt. continues to make progress. Pt. reports that he is engaging his left hand more at home. Pt. is attempting to use his left hand to assist with donning pants, and wash himself while showering in standing. Pt. is improving with left hand strength, motor control, and [facility] skills. Pt. continues to work on these skills in order to improve, and maximize independence with ADLs, and IADLs, manipulate small objects, and reach to top shelves.                       OT Education - 07/16/20 1306    Education Details grip strength, [facility]    Person(s) Educated  Patient    Methods Explanation;Demonstration    Comprehension Verbalized understanding;Returned demonstration            OT Short Term Goals - 06/20/20 1757      OT SHORT TERM GOAL #1   Title Pt will be compliant with HEP with green theraputty to increase fine motor control and independence.    Baseline 06/20/2020, exercises reviewed.    Time 4    Period Weeks    Status New    Target Date 07/18/20             OT Long Term Goals - 06/20/20 1759      OT LONG TERM GOAL #1   Title Pt will increase FOTO goal from 68 to 71 to improve performance of HH IADL tasks and overall QOL.    Baseline 7/14, FOTO score 68    Time 12    Period Weeks     Status New    Target Date 09/12/20      OT LONG TERM GOAL #2   Title Pt. will improve left grip strength by 10# to be able to improve his ability to hold ADL objects  in his hand.    Baseline 7/14 grip strength 59.67    Time 12    Period Weeks    Status New    Target Date 09/12/20      OT LONG TERM GOAL #3   Title Pt. will improve LUE motor control in order to independently reach up to place items on a shelf.    Baseline 7/14 limited gross motor control with reaching    Time 12    Period Weeks    Status New    Target Date 09/12/20      OT LONG TERM GOAL #4   Title Pt. will improve Left hand FMC skills by 3 sec. to be able to manipulate small objects independently for ADLs.    Baseline 7/14 L hand 39 seconds (R hand 27s)    Time 12    Period Weeks    Status New    Target Date 09/12/20                 Plan - 07/16/20 1307    Clinical Impression Statement Pt. continues to make progress. Pt. reports that he is engaging his left hand more at home. Pt. is attempting to use his left hand to assist with donning pants, and wash himself while showering in standing. Pt. is improving with left hand strength, motor control, and [facility] skills. Pt. Continues to work on these skills in order to improve, and maximize independence with ADLs, and IADLs, manipulate small objects, and reach to top shelves.   OT Occupational Profile and History Detailed Assessment- Review of Records and additional review of physical, cognitive, psychosocial history related to current functional performance    Occupational performance deficits (Please refer to evaluation for details): ADL's;IADL's    Body Structure / Function / Physical Skills ADL;Balance;FMC;GMC;Coordination;IADL;Strength;UE functional use    Rehab Potential Excellent    Clinical Decision Making Limited treatment options, no task modification necessary    Comorbidities Affecting Occupational Performance: May have comorbidities impacting  occupational performance    Modification or Assistance to Complete Evaluation  Min-Moderate modification of tasks or assist with assess necessary to complete eval    OT Frequency 2x / week    OT Duration 12 weeks    OT Treatment/Interventions Self-care/ADL training;Therapeutic exercise;Patient/family education;DME and/or AE instruction;Therapeutic activities;Neuromuscular education  Plan Therapeutic activities to enhance FMC/control, self-care re-training to modify as needed, neuromuscular education to enhance L side awareness as able, patient education to enhance safety and carryover outside of therapy, Therapeutic exercise to improve strength as it pertains to safe and efficient performance of ADLs.    OT Home Exercise Plan Use green putty to increase digit flex/ext strength.    Consulted and Agree with Plan of Care Patient           Patient will benefit from skilled therapeutic intervention in order to improve the following deficits and impairments:   Body Structure / Function / Physical Skills: ADL, Balance, FMC, GMC, Coordination, IADL, Strength, UE functional use       Visit Diagnosis: Muscle weakness (generalized)  Other lack of coordination    Problem List Patient Active Problem List   Diagnosis Date Noted   Malnutrition of moderate degree 06/25/2020   Fall    Palliative care by specialist    Subarachnoid hemorrhage (HCC) 06/23/2020   HYPERCHOLESTEROLEMIA 12/04/2008   RESTLESS LEGS SYNDROME 12/04/2008   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

## 2020-07-18 ENCOUNTER — Other Ambulatory Visit: Payer: Self-pay

## 2020-07-18 ENCOUNTER — Ambulatory Visit: Payer: Medicare HMO

## 2020-07-18 DIAGNOSIS — M6281 Muscle weakness (generalized): Secondary | ICD-10-CM

## 2020-07-18 DIAGNOSIS — R278 Other lack of coordination: Secondary | ICD-10-CM

## 2020-07-18 DIAGNOSIS — R2681 Unsteadiness on feet: Secondary | ICD-10-CM

## 2020-07-18 NOTE — Therapy (Signed)
[facility] [address] [place], [place], [address] Phone: [phone]   Fax:  [phone]  Physical Therapy Treatment  Patient Details  Name: Edward Campbell MRN: [id number] Date of Birth: May 30, 1941 Referring Provider (PT): Virgel Gesshancy, Stephanie   Encounter Date: 07/18/2020   PT End of Session - 07/18/20 1022    Visit Number 3    Number of Visits 16    Date for PT Re-Evaluation 08/22/20    Authorization Type 1/10 re-eval 06/27/20    PT Start Time 1018    PT Stop Time 1100    PT Time Calculation (min) 42 min    Equipment Utilized During Treatment Gait belt    Activity Tolerance Patient tolerated treatment well    Behavior During Therapy [facility] for tasks assessed/performed           Past Medical History:  Diagnosis Date  . Stroke [facility](HCC)     Past Surgical History:  Procedure Laterality Date  . BACK SURGERY      There were no vitals filed for this visit.   Subjective Assessment - 07/18/20 1020    Subjective Pt reports that he is tired today. He notes he just got Life Alert as his sons encouraged him [place] help around. He reports no major changes in health since last therapy session. He reports trying to get some walking in but other than has not been exercising or performing HEP. No specific question or concerns at this time.    Pertinent History Patient returning to PT after hospitalization 7/17-7/19. Was walking in the heat and fainted, bruising throughout body on L side.  Hasn't gone walking since returned home from hospitalization due to weakness. hx of prior R MCA stroke, HTN, HLD, and DVT. He had two ischemic strokes in <3 weeks (4/11 and 4/28)  with acute left arm weakness, numbness and dysarthria. Imaging on 4/12 positive for PFO with an WF > 55%, mild LVH and no thrombus. He also has an atrial septal aneurysm PMh includes  He had a driving safety test on 6/44/036/28/21 but was not  cleared to return to driving yet. Has meals on wheels to help with lunch. Apartment complex has a gym    Limitations Lifting;Standing;Walking;House hold activities    How long can you sit comfortably? n/a    How long can you stand comfortably? not sure    How long can you walk comfortably? unsteady now outside of house.    Patient Stated Goals to walk better and be more steady    Currently in Pain? No/denies              TREATMENT       Ther-ex    NuStep L2-3 x 5 minutes for warm-up during history, changing interval throughout (unbilled 2 minutes)  Precor single leg press 45# 2 x 10 BLE, cued to control the eccentric motion;  Standing exercises with 3# ankle weights (AW): with UE support Hip flexion marches x 10 BLE;  Hip abduction x 10 BLE;   Hip extension x 10 BLE, patient notes cramp in R hamstring; Heel/toe raises x 10 BLE.     Neuromuscular Re-education    Performed in // bars:  6" orange hurdle forward/backward step-over without UE support x 10 BLE. cued to bend R LE instead of circumducting leg; Alternating 6" step taps without UE support x 10 BLE, cued for no UE support; Airex alternating  6" step taps without UE support x 10 BLE, cued for no UE support Kicking a soccer ball x multiple attempts, able to stabilize on single leg and coordinate LE to kick ball, cued to not use UE support with arms across chest; Airex balance beam tandem walks x 2 lengths with on/off SUE support, loss of balance a couple of times; Airex balance beam side steps x 2 lengths with no UE support, easier than tandem walks for patient; Standing on airex foam pad with NBOS and balloon taps x multiple bouts, PT tapping the balloon to outside BOS to encourage reaching with SPT CGA, lost balance a couple of times; patient cued for no UE support and trunk leans on // bars. Kicking foam ball off cone x multiple bouts, able to stabilize on single leg and coordinate LE to kick ball, cued to not use UE  support. Next time progress to a tennis ball.      Pt educated throughout session about proper posture and technique with exercises. Improved exercise technique, movement at target joints, use of target muscles after min to mod verbal, visual, tactile cues.     Pt demonstrates excellent motivation during session today. Performed both balance and strength exercises with patient today. Added more dynamic exercises to challenge his balance and encourage single leg stance and reaching outside BOS. Cues needed to not use UE for support, but crossing arms across chest helped with that. Progress exercises as tolerated. Pt encouraged to continue his HEP and follow-up as scheduled. Pt will benefit from PT services to address deficits in strength, balance, and mobility in order to return to full function at home.             PT Short Term Goals - 06/27/20 1009      PT SHORT TERM GOAL #1   Title Patient will be independent in home exercise program to improve strength/mobility for better functional independence with ADLs    Baseline 7/1 HEP given    Time 2    Period Weeks    Status New    Target Date 07/11/20             PT Long Term Goals - 06/27/20 1009      PT LONG TERM GOAL #1   Title Patient will increase FOTO score to equal to or greater than 71 %   to demonstrate statistically significant improvement in mobility and quality of life.    Baseline 7/1: 63% 7/21: RECERT: 55.3%    Time 8    Period Weeks    Status New    Target Date 08/22/20      PT LONG TERM GOAL #2   Title Patient will demonstrate an improved Berg Balance Score of >47/56 as to demonstrate improved balance with ADLs such as sitting/standing and transfer balance and reduced fall risk.    Baseline 7/1: 41/56 7/21: RECERT 37/56    Time 8    Period Weeks    Status New    Target Date 08/22/20      PT LONG TERM GOAL #3   Title Patient will increase six minute walk test distance to >1500 for progression to  community ambulator age norm and improve gait ability    Baseline 7/1: 1159 ft with excessive foot drag; 7/21: RECERT 965 ft    Time 8    Period Weeks    Status New    Target Date 08/22/20      PT LONG TERM GOAL #4  Title Patient will increase BLE gross strength to 4+/5 as to improve functional strength for independent gait, increased standing tolerance and increased ADL ability.    Baseline 7/1: see chart 7/21: RECERT see note    Time 8    Period Weeks    Status New    Target Date 08/22/20      PT LONG TERM GOAL #5   Title Patient will safely negotiate obstacles in a crowded room without running into object on his left, with good foot clearance, and arm swing    Baseline 7/1: unable to 7/21: recert: unable    Time 8    Period Weeks    Status New    Target Date 08/22/20                 Plan - 07/18/20 1023    Clinical Impression Statement Pt demonstrates excellent motivation during session today. Performed both balance and strength exercises with patient today. Added more dynamic exercises to challenge his balance and encourage single leg stance and reaching outside BOS. Cues needed to not use UE for support, but crossing arms across chest helped with that. Progress exercises as tolerated. Pt encouraged to continue his HEP and follow-up as scheduled. Pt will benefit from PT services to address deficits in strength, balance, and mobility in order to return to full function at home.    Personal Factors and Comorbidities Age;Comorbidity 3+;Finances;Past/Current Experience;Transportation    Comorbidities stroke, HLD, lumbar radiculopathy, hypercholesterolemia, restless legs syndrome, raynaud's syndrome, superficial phlebitis, chronic prostatis,    Examination-Activity Limitations Bend;Carry;Hygiene/Grooming;Locomotion Level;Squat;Stairs;Stand;Transfers    Examination-Participation Restrictions Church;Cleaning;Community Activity;Driving;Volunteer;Shop;Yard Work    Oncologist    Rehab Potential Fair    PT Frequency 2x / week    PT Duration 8 weeks    PT Treatment/Interventions ADLs/Self Care Home Management;Aquatic Therapy;Biofeedback;Canalith Repostioning;Cryotherapy;Clinical cytogeneticist;Iontophoresis 4mg /ml Dexamethasone;Moist Heat;Ultrasound;Therapeutic exercise;Therapeutic activities;Functional mobility training;Stair training;DME Instruction;Balance training;Neuromuscular re-education;Patient/family education;Orthotic Fit/Training;Manual techniques;Passive range of motion;Dry needling;Energy conservation;Splinting;Taping;Vestibular;Vasopneumatic Device;Manual lymph drainage    PT Next Visit Plan ankle stability exercises, spatial awareness for L side, dual task.    PT Home Exercise Plan see above    Consulted and Agree with Plan of Care Patient           Patient will benefit from skilled therapeutic intervention in order to improve the following deficits and impairments:  Abnormal gait, Decreased activity tolerance, Decreased balance, Decreased endurance, Decreased coordination, Decreased cognition, Decreased knowledge of use of DME, Decreased mobility, Decreased safety awareness, Difficulty walking, Decreased strength, Impaired flexibility, Impaired perceived functional ability, Impaired UE functional use, Impaired vision/preception, Postural dysfunction, Improper body mechanics  Visit Diagnosis: Muscle weakness (generalized)  Other lack of coordination  Unsteadiness on feet     Problem List Patient Active Problem List   Diagnosis Date Noted  . Malnutrition of moderate degree 06/25/2020  . Fall   . Palliative care by specialist   . Subarachnoid hemorrhage (HCC) 06/23/2020  . HYPERCHOLESTEROLEMIA 12/04/2008  . 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 SUPERFICIAL PHLEBITIS 12/04/2008  . CHRONIC PROSTATITIS 12/04/2008  . PROSTATE SPECIFIC ANTIGEN, ELEVATED  12/04/2008    This entire session was performed under direct supervision and direction of a licensed therapist/therapist assistant . I have personally read, edited and approve of the note as written.   12/06/2008, SPT Katherine Basset Edward Campbell PT, DPT, GCS  Edward Campbell,Edward Campbell 07/18/2020, 4:41 PM  [facility] MAIN [facility] SERVICES [address]  [place], [place], [address] Phone: [phone]   Fax:  [phone]  Name: Edward Campbell MRN: [id number] Date of Birth: 1941-07-24

## 2020-07-20 ENCOUNTER — Encounter: Payer: Medicare HMO | Admitting: Occupational Therapy

## 2020-07-23 ENCOUNTER — Encounter: Payer: Self-pay | Admitting: Occupational Therapy

## 2020-07-23 ENCOUNTER — Other Ambulatory Visit: Payer: Self-pay

## 2020-07-23 ENCOUNTER — Ambulatory Visit: Payer: Medicare HMO | Admitting: Occupational Therapy

## 2020-07-23 DIAGNOSIS — M6281 Muscle weakness (generalized): Secondary | ICD-10-CM

## 2020-07-23 DIAGNOSIS — R278 Other lack of coordination: Secondary | ICD-10-CM

## 2020-07-23 NOTE — Therapy (Signed)
[facility] MAIN [facility] SERVICES [address] [place], [place], [address] Phone: [phone]   Fax:  [phone]  Occupational Therapy Treatment  Patient Details  Name: Edward Campbell MRN: [id number] Date of Birth: January 07, 1941 Referring Provider (OT): Dr.  Rolin Barry   Encounter Date: 07/23/2020   OT End of Session - 07/23/20 1304    Visit Number 6    Number of Visits 24    Date for OT Re-Evaluation 09/12/20    Authorization Type FOTO    Authorization Time Period Progress report period starting 06/20/2020    Activity Tolerance Patient tolerated treatment well    Behavior During Therapy [facility] for tasks assessed/performed           Past Medical History:  Diagnosis Date  . Stroke [facility])     Past Surgical History:  Procedure Laterality Date  . BACK SURGERY      There were no vitals filed for this visit.   Subjective Assessment - 07/23/20 1303    Subjective  Pt. reports doing well today.    Pertinent History Pt is an 79 y/o M with PMH: CVA with acute L sided weakness, numbness and dysarthria on 4/11 and 4/28 requiring hospitalization. Other pertinent medical history: includes HTN, HLD, and DVT.    Limitations LUE strength, motor control, and [facility] skills.    Special Tests Vision: scanning in tact, peripheral vision in tact in all 4 quadrants, appropriate tracking and convergence.    Currently in Pain? No/denies           OT TREATMENT    Neuro muscular re-education:  Pt. worked on manipulating buttoning, and unbuttoning a shirt with his left hand. Pt. requires increased time to complete. Pt. performed [facility] tasks using the Grooved pegboard. Pt. worked on grasping the grooved pegs from a horizontal position, and moving the pegs to a vertical position in the hand to prepare for placing them in the grooved slot. Pt. worked on translatory movements of the hand moving on peg at a time through the palm of the hand to the tip of the 2nd digit and  thumb. Increasing to  Two pegs at a time. Pt. Worked on removing the pegs while alternating thumb opposition to the tip of his 2nd dgit through thumb.  Therapeutic Ex:  Pt. Performed left gross gripping with grip strengthener. Pt. worked on sustaining grip while grasping pegs and reaching at various heights. The gripper was set at 17.9#. Pt. Worked on the [employer] 49.6#, 50.8#, 50.2#, 49.8#  Pt. has been making progress overall, and is able to use his left hand to assist with fixing a light. Pt. reports having difficulty with using his left hand for buttoning, and presents with left hand tremor, shakiness when holding a full glass in his hand, and carrying it. Pt. continues to require work on improving LUE strength, and [facility] skills.  Pt. continues to work on improving [facility] skills, and translatory movement patterns storing objects, and moving them through the palm of of his hand to, and from his digits. Pt. Continues to work on improving left hand function duirng ADLs, and IADLs in order to work towards maximizing independence.                     OT Education - 07/23/20 1303    Education Details grip strength, [facility]    Person(s) Educated Patient    Methods Explanation;Demonstration    Comprehension Verbalized understanding;Returned demonstration  OT Short Term Goals - 06/20/20 1757      OT SHORT TERM GOAL #1   Title Pt will be compliant with HEP with green theraputty to increase fine motor control and independence.    Baseline 06/20/2020, exercises reviewed.    Time 4    Period Weeks    Status New    Target Date 07/18/20             OT Long Term Goals - 06/20/20 1759      OT LONG TERM GOAL #1   Title Pt will increase FOTO goal from 68 to 71 to improve performance of HH IADL tasks and overall QOL.    Baseline 7/14, FOTO score 68    Time 12    Period Weeks    Status New    Target Date 09/12/20      OT LONG TERM GOAL #2   Title  Pt. will improve left grip strength by 10# to be able to improve his ability to hold ADL objects  in his hand.    Baseline 7/14 grip strength 59.67    Time 12    Period Weeks    Status New    Target Date 09/12/20      OT LONG TERM GOAL #3   Title Pt. will improve LUE motor control in order to independently reach up to place items on a shelf.    Baseline 7/14 limited gross motor control with reaching    Time 12    Period Weeks    Status New    Target Date 09/12/20      OT LONG TERM GOAL #4   Title Pt. will improve Left hand FMC skills by 3 sec. to be able to manipulate small objects independently for ADLs.    Baseline 7/14 L hand 39 seconds (R hand 27s)    Time 12    Period Weeks    Status New    Target Date 09/12/20                 Plan - 07/23/20 1304    Clinical Impression Statement Pt. has been making progress overall, and is able to use his left hand to assist with fixing a light. Pt. reports having difficulty with using his left hand for buttoning, and presents with left hand tremor, shakiness when holding a full glass in his hand, and carrying it. Pt. continues to require work on improving LUE strength, and [facility] skills. Pt. continues to work on improving [facility] skills, and translatory movement patterns storing objects, and moving them through the palm of of his hand to, and from his digits. Pt. Continues to work on improving left hand function duirng ADLs, and IADLs in order to work towards maximizing independence.   OT Occupational Profile and History Detailed Assessment- Review of Records and additional review of physical, cognitive, psychosocial history related to current functional performance    Occupational performance deficits (Please refer to evaluation for details): ADL's;IADL's    Body Structure / Function / Physical Skills ADL;Balance;FMC;GMC;Coordination;IADL;Strength;UE functional use    Rehab Potential Excellent    Clinical Decision Making Limited treatment  options, no task modification necessary    Comorbidities Affecting Occupational Performance: May have comorbidities impacting occupational performance    Modification or Assistance to Complete Evaluation  Min-Moderate modification of tasks or assist with assess necessary to complete eval    OT Frequency 2x / week    OT Duration 12 weeks    OT  Treatment/Interventions Self-care/ADL training;Therapeutic exercise;Patient/family education;DME and/or AE instruction;Therapeutic activities;Neuromuscular education    Plan Therapeutic activities to enhance FMC/control, self-care re-training to modify as needed, neuromuscular education to enhance L side awareness as able, patient education to enhance safety and carryover outside of therapy, Therapeutic exercise to improve strength as it pertains to safe and efficient performance of ADLs.    Consulted and Agree with Plan of Care Patient           Patient will benefit from skilled therapeutic intervention in order to improve the following deficits and impairments:   Body Structure / Function / Physical Skills: ADL, Balance, FMC, GMC, Coordination, IADL, Strength, UE functional use       Visit Diagnosis: Muscle weakness (generalized)  Other lack of coordination    Problem List Patient Active Problem List   Diagnosis Date Noted  . Malnutrition of moderate degree 06/25/2020  . Fall   . Palliative care by specialist   . Subarachnoid hemorrhage (HCC) 06/23/2020  . HYPERCHOLESTEROLEMIA 12/04/2008  . 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 SUPERFICIAL PHLEBITIS 12/04/2008  . CHRONIC PROSTATITIS 12/04/2008  . PROSTATE SPECIFIC ANTIGEN, ELEVATED 12/04/2008    Olegario Messier, MS, OTR/L 07/23/2020, 1:07 PM  [facility] MAIN [facility] SERVICES [address] [place], [place], [address] Phone: [phone]   Fax:  [phone]  Name: Edward Campbell MRN: [id number] Date of Birth:  [birth year]

## 2020-07-25 ENCOUNTER — Encounter: Payer: Self-pay | Admitting: Occupational Therapy

## 2020-07-25 ENCOUNTER — Ambulatory Visit: Payer: Medicare HMO

## 2020-07-25 ENCOUNTER — Ambulatory Visit: Payer: Medicare HMO | Admitting: Occupational Therapy

## 2020-07-25 ENCOUNTER — Other Ambulatory Visit: Payer: Self-pay

## 2020-07-25 DIAGNOSIS — M6281 Muscle weakness (generalized): Secondary | ICD-10-CM

## 2020-07-25 DIAGNOSIS — R278 Other lack of coordination: Secondary | ICD-10-CM

## 2020-07-25 DIAGNOSIS — R2681 Unsteadiness on feet: Secondary | ICD-10-CM

## 2020-07-25 NOTE — Therapy (Signed)
[place] [facility] MAIN [facility] SERVICES [address] [place], [place], [address] Phone: [phone]   Fax:  [phone]  Occupational Therapy Treatment  Patient Details  Name: Edward Campbell MRN: [id number] Date of Birth: 1941/10/28 Referring Provider (OT): Dr.  Rolin Barry   Encounter Date: 07/25/2020   OT End of Session - 07/25/20 1113    Visit Number 7    Number of Visits 24    Date for OT Re-Evaluation 09/12/20    Authorization Type FOTO    Authorization Time Period Progress report period starting 06/20/2020    OT Start Time 1105    OT Stop Time 1145    OT Time Calculation (min) 40 min    Equipment Utilized During Treatment dynamometer, 9 hold peg test, pinch test.    Activity Tolerance Patient tolerated treatment well    Behavior During Therapy [facility] for tasks assessed/performed           Past Medical History:  Diagnosis Date  . Stroke [facility])     Past Surgical History:  Procedure Laterality Date  . BACK SURGERY      There were no vitals filed for this visit.   Subjective Assessment - 07/25/20 1112    Subjective  Pt. reports doing well today.    Pertinent History Pt is an 79 y/o M with PMH: CVA with acute L sided weakness, numbness and dysarthria on 4/11 and 4/28 requiring hospitalization. Other pertinent medical history: includes HTN, HLD, and DVT.    Limitations LUE strength, motor control, and [facility] skills.    Special Tests Vision: scanning in tact, peripheral vision in tact in all 4 quadrants, appropriate tracking and convergence.    Patient Stated Goals To improve coordination and ultimate goal is to drive again    Currently in Pain? No/denies          OT TREATMENT    Neuro muscular re-education:  Pt. worked on left hand [facility] skills grasping, and manipulating 1" circular objects. Pt. worked on translatory movements moving the objects through his hand. Pt. worked on thumb opposition to 2nd through 5th digits. Pt. presents with  difficulty with translatory movements moving the objects through his left hand, and as well as thumb opposition to the 4th, and 5th digits.  Pt. reports that he has been trying to worked with his left hand on grasping screws. Pt. Education was provided about trying flat, or smooth items such as dimes, or dried pinto beans. Pt. Reports that he needs to try using his left hand more during tasks at home. Pt. Education was provided about opportunities to engage his left hand during ADLs, and IADL tasks at home. Pt. Continues to work on improving overall LUE functioning during ADLs, and IADL tasks.                          OT Education - 07/25/20 1113    Education Details grip strength, [facility]    Person(s) Educated Patient    Methods Explanation;Demonstration    Comprehension Verbalized understanding;Returned demonstration            OT Short Term Goals - 06/20/20 1757      OT SHORT TERM GOAL #1   Title Pt will be compliant with HEP with green theraputty to increase fine motor control and independence.    Baseline 06/20/2020, exercises reviewed.    Time 4    Period Weeks    Status New  Target Date 07/18/20             OT Long Term Goals - 06/20/20 1759      OT LONG TERM GOAL #1   Title Pt will increase FOTO goal from 68 to 71 to improve performance of HH IADL tasks and overall QOL.    Baseline 7/14, FOTO score 68    Time 12    Period Weeks    Status New    Target Date 09/12/20      OT LONG TERM GOAL #2   Title Pt. will improve left grip strength by 10# to be able to improve his ability to hold ADL objects  in his hand.    Baseline 7/14 grip strength 59.67    Time 12    Period Weeks    Status New    Target Date 09/12/20      OT LONG TERM GOAL #3   Title Pt. will improve LUE motor control in order to independently reach up to place items on a shelf.    Baseline 7/14 limited gross motor control with reaching    Time 12    Period Weeks    Status New     Target Date 09/12/20      OT LONG TERM GOAL #4   Title Pt. will improve Left hand FMC skills by 3 sec. to be able to manipulate small objects independently for ADLs.    Baseline 7/14 L hand 39 seconds (R hand 27s)    Time 12    Period Weeks    Status New    Target Date 09/12/20                 Plan - 07/25/20 1114    Clinical Impression Statement Pt. reports that he has been trying to worked with his left hand on grasping screws. Pt. Education was provided about trying flat, or smooth items such as dimes, or dried pinto beans. Pt. Reports that he needs to try using his left hand more during tasks at home. Pt. Education was provided about opportunities to engage his left hand during ADLs, and IADL tasks at home. Pt. Continues to work on improving overall LUE functioning during ADLs, and IADL tasks.    OT Occupational Profile and History Detailed Assessment- Review of Records and additional review of physical, cognitive, psychosocial history related to current functional performance    Occupational performance deficits (Please refer to evaluation for details): ADL's;IADL's    Body Structure / Function / Physical Skills ADL;Balance;FMC;GMC;Coordination;IADL;Strength;UE functional use    Rehab Potential Excellent    Clinical Decision Making Limited treatment options, no task modification necessary    Comorbidities Affecting Occupational Performance: May have comorbidities impacting occupational performance    Modification or Assistance to Complete Evaluation  Min-Moderate modification of tasks or assist with assess necessary to complete eval    OT Frequency 2x / week    OT Duration 12 weeks    OT Treatment/Interventions Self-care/ADL training;Therapeutic exercise;Patient/family education;DME and/or AE instruction;Therapeutic activities;Neuromuscular education    Plan Therapeutic activities to enhance FMC/control, self-care re-training to modify as needed, neuromuscular education to enhance  L side awareness as able, patient education to enhance safety and carryover outside of therapy, Therapeutic exercise to improve strength as it pertains to safe and efficient performance of ADLs.    Consulted and Agree with Plan of Care Patient           Patient will benefit from skilled therapeutic intervention in order  to improve the following deficits and impairments:   Body Structure / Function / Physical Skills: ADL, Balance, FMC, GMC, Coordination, IADL, Strength, UE functional use       Visit Diagnosis: Muscle weakness (generalized)  Other lack of coordination    Problem List Patient Active Problem List   Diagnosis Date Noted  . Malnutrition of moderate degree 06/25/2020  . Fall   . Palliative care by specialist   . Subarachnoid hemorrhage (HCC) 06/23/2020  . HYPERCHOLESTEROLEMIA 12/04/2008  . 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 SUPERFICIAL PHLEBITIS 12/04/2008  . CHRONIC PROSTATITIS 12/04/2008  . PROSTATE SPECIFIC ANTIGEN, ELEVATED 12/04/2008    Olegario Messier, MS, OTR/L 07/25/2020, 11:19 AM   [facility] MAIN [facility] SERVICES [address] [place], [place], [address] Phone: [phone]   Fax:  [phone]  Name: ALEXANDROS EWAN MRN: [id number] Date of Birth: [birth year]

## 2020-07-25 NOTE — Therapy (Signed)
[place] [facility] MAIN [facility] SERVICES [address] [place], [place], [address] Phone: [phone]   Fax:  [phone]  Physical Therapy Treatment  Patient Details  Name: Edward Campbell MRN: [id number] Date of Birth: [birth year] Referring Provider (PT): Virgel Gess   Encounter Date: 07/25/2020   PT End of Session - 07/25/20 1611    Visit Number 4    Number of Visits 16    Date for PT Re-Evaluation 08/22/20    Authorization Type 2/10 re-eval 06/27/20    PT Start Time 1015    PT Stop Time 1100    PT Time Calculation (min) 45 min    Equipment Utilized During Treatment Gait belt    Activity Tolerance Patient tolerated treatment well    Behavior During Therapy [facility] for tasks assessed/performed           Past Medical History:  Diagnosis Date  . Stroke [facility])     Past Surgical History:  Procedure Laterality Date  . BACK SURGERY      There were no vitals filed for this visit.   Subjective Assessment - 07/25/20 1017    Subjective Pt reports that he is doing well. No updates since last visit. No specific question or concerns at this time.    Pertinent History Patient returning to PT after hospitalization 7/17-7/19. Was walking in the heat and fainted, bruising throughout body on L side.  Hasn't gone walking since returned home from hospitalization due to weakness. hx of prior R MCA stroke, HTN, HLD, and DVT. He had two ischemic strokes in <3 weeks (4/11 and 4/28)  with acute left arm weakness, numbness and dysarthria. Imaging on 4/12 positive for PFO with an WF > 55%, mild LVH and no thrombus. He also has an atrial septal aneurysm PMh includes  He had a driving safety test on 3/53/61 but was not cleared to return to driving yet. Has meals on wheels to help with lunch. Apartment complex has a gym    Limitations Lifting;Standing;Walking;House hold activities    How long can you sit comfortably? n/a    How long can you stand comfortably? not sure     How long can you walk comfortably? unsteady now outside of house.    Patient Stated Goals to walk better and be more steady    Currently in Pain? No/denies            TREATMENT     Ther-ex    NuStep L2-3 x 5 minutes for warm-up during history, changing interval throughout (unbilled 2 minutes)   Precor leg press 55# x 20, cued to control the eccentric motion;  Precor single leg press 45# x 20 BLE, cued to control eccentric motion;  Standing exercises with 4# ankle weights (AW): with UE support  Hip flexion marches x 20 BLE;   Hip abduction x 20 BLE;    Hip extension x 20 BLE,  Heel/toe raises x 20 BLE.      Neuromuscular Re-education    Agility ladder: forwards x 2 lengths, side steps x 4 lengths, forward in and outs x 2 lengths, lateral in and outs x 2 lengths, cued for proper body positioning, foot positioning, and bigger steps;  4 square stepping counter clockwise/clockwise and with diagonals x 3 rounds, with poles on ground to step over, cued for bigger steps, patient lost balance a couple of times but able to recover with stepping strategies;  Standing on airex pad with alternating 6" step taps without  UE support x 10 BLE, cued for no UE support;  6" step ups with foam pad underneath feet x multiple bouts, cued for no UE support  Kicking a soccer ball x multiple attempts, able to stabilize on single leg and coordinate LE to kick ball, cued to not use UE support with arms across chest;   Kicking a tennis ball off cone x multiple bouts, able to stabilize on single leg and coordinate LE to kick ball, cued to not use UE support.    Pt educated throughout session about proper posture and technique with exercises. Improved exercise technique, movement at target joints, use of target muscles after min to mod verbal, visual, tactile cues.      Pt demonstrates excellent motivation during session today. Performed both balance and strengthening exercises with patient today.  Increased AW to 4#, showing increased strength. Added more dynamic exercises like agility ladder to challenge his balance, coordination, and encourage single leg stance and stepping outside BOS. Cues needed to not use UE for support, but crossing arms across chest helped with that.  Pt encouraged to continue his HEP and follow-up as scheduled. Pt will benefit from PT services to address deficits in strength, balance, and mobility in order to return to full function at home.              PT Short Term Goals - 06/27/20 1009      PT SHORT TERM GOAL #1   Title Patient will be independent in home exercise program to improve strength/mobility for better functional independence with ADLs    Baseline 7/1 HEP given    Time 2    Period Weeks    Status New    Target Date 07/11/20             PT Long Term Goals - 06/27/20 1009      PT LONG TERM GOAL #1   Title Patient will increase FOTO score to equal to or greater than 71 %   to demonstrate statistically significant improvement in mobility and quality of life.    Baseline 7/1: 63% 7/21: RECERT: 55.3%    Time 8    Period Weeks    Status New    Target Date 08/22/20      PT LONG TERM GOAL #2   Title Patient will demonstrate an improved Berg Balance Score of >47/56 as to demonstrate improved balance with ADLs such as sitting/standing and transfer balance and reduced fall risk.    Baseline 7/1: 41/56 7/21: RECERT 37/56    Time 8    Period Weeks    Status New    Target Date 08/22/20      PT LONG TERM GOAL #3   Title Patient will increase six minute walk test distance to >1500 for progression to community ambulator age norm and improve gait ability    Baseline 7/1: 1159 ft with excessive foot drag; 7/21: RECERT 965 ft    Time 8    Period Weeks    Status New    Target Date 08/22/20      PT LONG TERM GOAL #4   Title Patient will increase BLE gross strength to 4+/5 as to improve functional strength for independent gait, increased  standing tolerance and increased ADL ability.    Baseline 7/1: see chart 7/21: RECERT see note    Time 8    Period Weeks    Status New    Target Date 08/22/20      PT  LONG TERM GOAL #5   Title Patient will safely negotiate obstacles in a crowded room without running into object on his left, with good foot clearance, and arm swing    Baseline 7/1: unable to 7/21: recert: unable    Time 8    Period Weeks    Status New    Target Date 08/22/20                 Plan - 07/25/20 1611    Clinical Impression Statement Pt demonstrates excellent motivation during session today. Performed both balance and strengthening exercises with patient today. Increased AW to 4#, showing increased strength. Added more dynamic exercises like agility ladder to challenge his balance, coordination, and encourage single leg stance and stepping outside BOS. Cues needed to not use UE for support, but crossing arms across chest helped with that.  Pt encouraged to continue his HEP and follow-up as scheduled. Pt will benefit from PT services to address deficits in strength, balance, and mobility in order to return to full function at home.    Personal Factors and Comorbidities Age;Comorbidity 3+;Finances;Past/Current Experience;Transportation    Comorbidities stroke, HLD, lumbar radiculopathy, hypercholesterolemia, restless legs syndrome, raynaud's syndrome, superficial phlebitis, chronic prostatis,    Examination-Activity Limitations Bend;Carry;Hygiene/Grooming;Locomotion Level;Squat;Stairs;Stand;Transfers    Examination-Participation Restrictions Church;Cleaning;Community Activity;Driving;Volunteer;Shop;Yard Work    Advertising account planner    Rehab Potential Fair    PT Frequency 2x / week    PT Duration 8 weeks    PT Treatment/Interventions ADLs/Self Care Home Management;Aquatic Therapy;Biofeedback;Canalith Repostioning;Cryotherapy;Museum/gallery curator;Iontophoresis 4mg /ml Dexamethasone;Moist Heat;Ultrasound;Therapeutic exercise;Therapeutic activities;Functional mobility training;Stair training;DME Instruction;Balance training;Neuromuscular re-education;Patient/family education;Orthotic Fit/Training;Manual techniques;Passive range of motion;Dry needling;Energy conservation;Splinting;Taping;Vestibular;Vasopneumatic Device;Manual lymph drainage    PT Next Visit Plan ankle stability exercises, spatial awareness for L side, dual task.    PT Home Exercise Plan see above    Consulted and Agree with Plan of Care Patient           Patient will benefit from skilled therapeutic intervention in order to improve the following deficits and impairments:  Abnormal gait, Decreased activity tolerance, Decreased balance, Decreased endurance, Decreased coordination, Decreased cognition, Decreased knowledge of use of DME, Decreased mobility, Decreased safety awareness, Difficulty walking, Decreased strength, Impaired flexibility, Impaired perceived functional ability, Impaired UE functional use, Impaired vision/preception, Postural dysfunction, Improper body mechanics  Visit Diagnosis: Muscle weakness (generalized)  Other lack of coordination  Unsteadiness on feet     Problem List Patient Active Problem List   Diagnosis Date Noted  . Malnutrition of moderate degree 06/25/2020  . Fall   . Palliative care by specialist   . Subarachnoid hemorrhage (HCC) 06/23/2020  . HYPERCHOLESTEROLEMIA 12/04/2008  . 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 SUPERFICIAL PHLEBITIS 12/04/2008  . CHRONIC PROSTATITIS 12/04/2008  . PROSTATE SPECIFIC ANTIGEN, ELEVATED 12/04/2008    This entire session was performed under direct supervision and direction of a licensed therapist/therapist assistant . I have personally read, edited and approve of the note as written.    12/06/2008, SPT Katherine Basset PT, DPT, GCS   Huprich,Jason 07/26/2020, 1:39 PM  [place] [facility] MAIN [facility] SERVICES [address] [place], [place], [place] Phone: [phone]   Fax:  [phone]  Name: CHRISTY FRIEDE MRN: Dortha Kern Date of Birth: January 22, 1941

## 2020-07-30 ENCOUNTER — Ambulatory Visit: Payer: Medicare HMO | Admitting: Occupational Therapy

## 2020-07-30 ENCOUNTER — Other Ambulatory Visit: Payer: Self-pay

## 2020-07-30 DIAGNOSIS — M6281 Muscle weakness (generalized): Secondary | ICD-10-CM

## 2020-07-30 DIAGNOSIS — R278 Other lack of coordination: Secondary | ICD-10-CM

## 2020-07-31 ENCOUNTER — Encounter: Payer: Self-pay | Admitting: Occupational Therapy

## 2020-07-31 NOTE — Therapy (Signed)
[facility] MAIN [facility] SERVICES [address] [place], [place], [address] Phone: [phone]   Fax:  [phone]  Occupational Therapy Treatment  Patient Details  Name: Edward Campbell MRN: [id number] Date of Birth: 08/19/1941 Referring Provider (OT): Dr.  Rolin Barry   Encounter Date: 07/30/2020   OT End of Session - 07/31/20 0834    Visit Number 8    Number of Visits 24    Date for OT Re-Evaluation 09/12/20    Authorization Type FOTO    Authorization Time Period Progress report period starting 06/20/2020    OT Start Time 1145    OT Stop Time 1229    OT Time Calculation (min) 44 min    Activity Tolerance Patient tolerated treatment well    Behavior During Therapy [facility] for tasks assessed/performed           Past Medical History:  Diagnosis Date  . Stroke [facility])     Past Surgical History:  Procedure Laterality Date  . BACK SURGERY      There were no vitals filed for this visit.   Subjective Assessment - 07/31/20 0833    Subjective  Pt reports doing well and that he has been working on typing at home.    Pertinent History Pt is an 79 y/o M with PMH: CVA with acute L sided weakness, numbness and dysarthria on 4/11 and 4/28 requiring hospitalization. Other pertinent medical history: includes HTN, HLD, and DVT.    Limitations LUE strength, motor control, and [facility] skills.    Patient Stated Goals To improve coordination and ultimate goal is to drive again    Currently in Pain? No/denies            Neuro Re-Ed: Pt seen for OT tx this date. OT facilitates pt participation in L UE [facility] tasks including typing for which pt averages 5 WPM on 3 trials. In addition, OT facilitates pt participation in trial/training re: in-hand manipulation including translation, rotation, passing fine motor manipulative from digit to digit, and pincer versus tripod grasp patterns. Pt with difficulty retaining handful of coins while rotating to between thumb and  forefinger with ~60% success, 90% success getting slot on container. Pt is able to pass manipulatives to all digits, requiring increased time, with exception of 5th digit of L hand. Lastly, OT engages pt in large peg placement with pt requiring 73m42s to place 20 parge pegs of varying length and 65m19s overall including removal. Overall, pt continues to progress with fine motro coordination. Will continue to follow per OT POC. Plan to continue focus on fine motor skills and strength of L UE.                     OT Education - 07/31/20 0834    Education Details grip strength, [facility]    Person(s) Educated Patient    Methods Explanation;Demonstration    Comprehension Verbalized understanding;Returned demonstration            OT Short Term Goals - 06/20/20 1757      OT SHORT TERM GOAL #1   Title Pt will be compliant with HEP with green theraputty to increase fine motor control and independence.    Baseline 06/20/2020, exercises reviewed.    Time 4    Period Weeks    Status New    Target Date 07/18/20             OT Long Term Goals - 06/20/20 1759  OT LONG TERM GOAL #1   Title Pt will increase FOTO goal from 68 to 71 to improve performance of HH IADL tasks and overall QOL.    Baseline 7/14, FOTO score 68    Time 12    Period Weeks    Status New    Target Date 09/12/20      OT LONG TERM GOAL #2   Title Pt. will improve left grip strength by 10# to be able to improve his ability to hold ADL objects  in his hand.    Baseline 7/14 grip strength 59.67    Time 12    Period Weeks    Status New    Target Date 09/12/20      OT LONG TERM GOAL #3   Title Pt. will improve LUE motor control in order to independently reach up to place items on a shelf.    Baseline 7/14 limited gross motor control with reaching    Time 12    Period Weeks    Status New    Target Date 09/12/20      OT LONG TERM GOAL #4   Title Pt. will improve Left hand FMC skills by 3 sec. to be able  to manipulate small objects independently for ADLs.    Baseline 7/14 L hand 39 seconds (R hand 27s)    Time 12    Period Weeks    Status New    Target Date 09/12/20                 Plan - 07/31/20 0836    Clinical Impression Statement Pt presents to therapy today motivated and in good spirits. He reports working on typing at home as this is a coordination task he would like to improve. OT engages pt in various [facility] skills including typing, translation and rotation with coins, and pincer as well as tripod grasp patterns. Pt tolerates treatment well. Will continue to follow per POC.    OT Occupational Profile and History Detailed Assessment- Review of Records and additional review of physical, cognitive, psychosocial history related to current functional performance    Occupational performance deficits (Please refer to evaluation for details): ADL's;IADL's    Body Structure / Function / Physical Skills ADL;Balance;FMC;GMC;Coordination;IADL;Strength;UE functional use    Rehab Potential Excellent    Clinical Decision Making Limited treatment options, no task modification necessary    Comorbidities Affecting Occupational Performance: May have comorbidities impacting occupational performance    Modification or Assistance to Complete Evaluation  Min-Moderate modification of tasks or assist with assess necessary to complete eval    OT Frequency 2x / week    OT Duration 12 weeks    OT Treatment/Interventions Self-care/ADL training;Therapeutic exercise;Patient/family education;DME and/or AE instruction;Therapeutic activities;Neuromuscular education    Plan Therapeutic activities to enhance FMC/control, self-care re-training to modify as needed, neuromuscular education to enhance L side awareness as able, patient education to enhance safety and carryover outside of therapy, Therapeutic exercise to improve strength as it pertains to safe and efficient performance of ADLs.    OT Home Exercise Plan Use  green putty to increase digit flex/ext strength, stretching wrist/digits and rest breaks between exercise to prevent cramping    Consulted and Agree with Plan of Care Patient           Patient will benefit from skilled therapeutic intervention in order to improve the following deficits and impairments:   Body Structure / Function / Physical Skills: ADL, Balance, FMC, GMC, Coordination, IADL, Strength,  UE functional use       Visit Diagnosis: Muscle weakness (generalized)  Other lack of coordination    Problem List Patient Active Problem List   Diagnosis Date Noted  . Malnutrition of moderate degree 06/25/2020  . Fall   . Palliative care by specialist   . Subarachnoid hemorrhage (HCC) 06/23/2020  . HYPERCHOLESTEROLEMIA 12/04/2008  . 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 SUPERFICIAL PHLEBITIS 12/04/2008  . CHRONIC PROSTATITIS 12/04/2008  . PROSTATE SPECIFIC ANTIGEN, ELEVATED 12/04/2008    Darl Householder Violette Morneault 07/31/2020, 8:43 AM  [facility] MAIN [facility] SERVICES [address] [place], [place], [address] Phone: [phone]   Fax:  [phone]  Name: Edward Campbell MRN: [id number] Date of Birth: 1941-07-25

## 2020-08-01 ENCOUNTER — Ambulatory Visit: Payer: Medicare HMO

## 2020-08-01 ENCOUNTER — Other Ambulatory Visit: Payer: Self-pay

## 2020-08-01 DIAGNOSIS — R2681 Unsteadiness on feet: Secondary | ICD-10-CM

## 2020-08-01 DIAGNOSIS — R278 Other lack of coordination: Secondary | ICD-10-CM

## 2020-08-01 DIAGNOSIS — M6281 Muscle weakness (generalized): Secondary | ICD-10-CM

## 2020-08-01 NOTE — Therapy (Signed)
[facility] MAIN [facility] SERVICES [address] [place], [place], [address] Phone: [phone]   Fax:  [phone]  Physical Therapy Treatment  Patient Details  Name: Edward Campbell MRN: [id number] Date of Birth: 1941-04-19 Referring Provider (PT): Virgel Gess   Encounter Date: 08/01/2020   PT End of Session - 08/01/20 1519    Visit Number 5    Number of Visits 16    Date for PT Re-Evaluation 08/22/20    Authorization Type 2/10 re-eval 06/27/20    PT Start Time 1516    PT Stop Time 1600    PT Time Calculation (min) 44 min    Equipment Utilized During Treatment Gait belt    Activity Tolerance Patient tolerated treatment well    Behavior During Therapy [facility] for tasks assessed/performed           Past Medical History:  Diagnosis Date  . Stroke [facility])     Past Surgical History:  Procedure Laterality Date  . BACK SURGERY      There were no vitals filed for this visit.   Subjective Assessment - 08/01/20 1519    Subjective Pt reports that he is doing well. No updates since last visit. No specific question or concerns at this time.    Pertinent History Patient returning to PT after hospitalization 7/17-7/19. Was walking in the heat and fainted, bruising throughout body on L side.  Hasn't gone walking since returned home from hospitalization due to weakness. hx of prior R MCA stroke, HTN, HLD, and DVT. He had two ischemic strokes in <3 weeks (4/11 and 4/28)  with acute left arm weakness, numbness and dysarthria. Imaging on 4/12 positive for PFO with an WF > 55%, mild LVH and no thrombus. He also has an atrial septal aneurysm PMh includes  He had a driving safety test on 9/38/18 but was not cleared to return to driving yet. Has meals on wheels to help with lunch. Apartment complex has a gym    Limitations Lifting;Standing;Walking;House hold activities    How long can you sit comfortably? n/a    How long can you stand comfortably? not sure     How long can you walk comfortably? unsteady now outside of house.    Patient Stated Goals to walk better and be more steady    Currently in Pain? No/denies             TREATMENT      Ther-ex    NuStep L2-3 x 4 minutes for warm-up during history, changing interval throughout (unbilled 2 minutes)    Precor leg press 55# x 20, cued to control the eccentric motion;  Precor single leg press 45# x 20 BLE, cued to control eccentric motion;   Standing exercises with 5# ankle weights (AW): with UE support  Hip flexion marches x 20 BLE;   Hip abduction x 20 BLE;    Hip extension x 20 BLE, cued to keep upright posture Heel/toe raises x 20 BLE.     Neuromuscular Re-education    Standing on airex pad with alternating 6" step taps without UE support x 10 BLE, cued to pick leg up higher to clear the step;  6" step ups with foam pad underneath feet x multiple bouts, cued for no UE support  Step over 1/2 foam roller throws/catches with 2kg ball, multiple bouts; more challenging stepping backwards with occasional LOB, SPT performing CGA with PT providing ball toss, cued for no UE suport;  Airex  pads sandwiched with 6" step, lateral step up/down 10x each direction, patient cued to brace abs when stepping up and down;   Tapping cones x multiple bouts with SPT calling out what foot to touch what color cones, patient able to balance and touch multiple cones at once before touching the pad.Performed series of1 and2 cone tapping, CGA assist, will add airex pad next time for more difficulty;   Pt educated throughout session about proper posture and technique with exercises. Improved exercise technique, movement at target joints, use of target muscles after min to mod verbal, visual, tactile cues.        Pt demonstrates excellent motivation during session today. Performed both balance and strengthening exercises with patient today. Increased AW to 5#, showing increased strength. Added more dynamic  exercises like step over with ball throws and single leg stance exercises to challenge balance and  stepping outside BOS. Cues needed to not use UE for support. Pt encouraged to continue his HEP and follow-up as scheduled. Pt will benefit from PT services to address deficits in strength, balance, and mobility in order to return to full function at home.        PT Short Term Goals - 06/27/20 1009      PT SHORT TERM GOAL #1   Title Patient will be independent in home exercise program to improve strength/mobility for better functional independence with ADLs    Baseline 7/1 HEP given    Time 2    Period Weeks    Status New    Target Date 07/11/20             PT Long Term Goals - 06/27/20 1009      PT LONG TERM GOAL #1   Title Patient will increase FOTO score to equal to or greater than 71 %   to demonstrate statistically significant improvement in mobility and quality of life.    Baseline 7/1: 63% 7/21: RECERT: 55.3%    Time 8    Period Weeks    Status New    Target Date 08/22/20      PT LONG TERM GOAL #2   Title Patient will demonstrate an improved Berg Balance Score of >47/56 as to demonstrate improved balance with ADLs such as sitting/standing and transfer balance and reduced fall risk.    Baseline 7/1: 41/56 7/21: RECERT 37/56    Time 8    Period Weeks    Status New    Target Date 08/22/20      PT LONG TERM GOAL #3   Title Patient will increase six minute walk test distance to >1500 for progression to community ambulator age norm and improve gait ability    Baseline 7/1: 1159 ft with excessive foot drag; 7/21: RECERT 965 ft    Time 8    Period Weeks    Status New    Target Date 08/22/20      PT LONG TERM GOAL #4   Title Patient will increase BLE gross strength to 4+/5 as to improve functional strength for independent gait, increased standing tolerance and increased ADL ability.    Baseline 7/1: see chart 7/21: RECERT see note    Time 8    Period Weeks    Status New     Target Date 08/22/20      PT LONG TERM GOAL #5   Title Patient will safely negotiate obstacles in a crowded room without running into object on his left, with good foot clearance, and arm  swing    Baseline 7/1: unable to 7/21: recert: unable    Time 8    Period Weeks    Status New    Target Date 08/22/20                 Plan - 08/01/20 1520    Clinical Impression Statement Pt demonstrates excellent motivation during session today. Performed both balance and strengthening exercises with patient today. Increased AW to 5#, showing increased strength. Added more dynamic exercises like step over with ball throws and single leg stance exercises to challenge balance and  stepping outside BOS. Cues needed to not use UE for support. Pt encouraged to continue his HEP and follow-up as scheduled. Pt will benefit from PT services to address deficits in strength, balance, and mobility in order to return to full function at home.    Personal Factors and Comorbidities Age;Comorbidity 3+;Finances;Past/Current Experience;Transportation    Comorbidities stroke, HLD, lumbar radiculopathy, hypercholesterolemia, restless legs syndrome, raynaud's syndrome, superficial phlebitis, chronic prostatis,    Examination-Activity Limitations Bend;Carry;Hygiene/Grooming;Locomotion Level;Squat;Stairs;Stand;Transfers    Examination-Participation Restrictions Church;Cleaning;Community Activity;Driving;Volunteer;Shop;Yard Work    Advertising account planner    Rehab Potential Fair    PT Frequency 2x / week    PT Duration 8 weeks    PT Treatment/Interventions ADLs/Self Care Home Management;Aquatic Therapy;Biofeedback;Canalith Repostioning;Cryotherapy;Clinical cytogeneticist;Iontophoresis 4mg /ml Dexamethasone;Moist Heat;Ultrasound;Therapeutic exercise;Therapeutic activities;Functional mobility training;Stair training;DME Instruction;Balance training;Neuromuscular  re-education;Patient/family education;Orthotic Fit/Training;Manual techniques;Passive range of motion;Dry needling;Energy conservation;Splinting;Taping;Vestibular;Vasopneumatic Device;Manual lymph drainage    PT Next Visit Plan ankle stability exercises, spatial awareness for L side, dual task.    PT Home Exercise Plan see above    Consulted and Agree with Plan of Care Patient           Patient will benefit from skilled therapeutic intervention in order to improve the following deficits and impairments:  Abnormal gait, Decreased activity tolerance, Decreased balance, Decreased endurance, Decreased coordination, Decreased cognition, Decreased knowledge of use of DME, Decreased mobility, Decreased safety awareness, Difficulty walking, Decreased strength, Impaired flexibility, Impaired perceived functional ability, Impaired UE functional use, Impaired vision/preception, Postural dysfunction, Improper body mechanics  Visit Diagnosis: Muscle weakness (generalized)  Other lack of coordination  Unsteadiness on feet     Problem List Patient Active Problem List   Diagnosis Date Noted  . Malnutrition of moderate degree 06/25/2020  . Fall   . Palliative care by specialist   . Subarachnoid hemorrhage (HCC) 06/23/2020  . HYPERCHOLESTEROLEMIA 12/04/2008  . 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 SUPERFICIAL PHLEBITIS 12/04/2008  . CHRONIC PROSTATITIS 12/04/2008  . PROSTATE SPECIFIC ANTIGEN, ELEVATED 12/04/2008    12/06/2008, SPT Katherine Basset Edward Campbell PT, DPT, GCS  Edward Campbell,Edward Campbell 08/02/2020, 1:44 PM  [facility] MAIN [facility] SERVICES [address] [place], [place], [place] Phone: [phone]   Fax:  [phone]  Name: Edward Campbell MRN: Dortha Kern Date of Birth: Aug 17, 1941

## 2020-08-07 ENCOUNTER — Ambulatory Visit: Payer: Medicare HMO | Admitting: Occupational Therapy

## 2020-08-07 ENCOUNTER — Ambulatory Visit: Payer: Medicare HMO

## 2020-08-09 ENCOUNTER — Ambulatory Visit: Payer: Medicare HMO | Admitting: Occupational Therapy

## 2020-08-09 ENCOUNTER — Ambulatory Visit: Payer: Medicare HMO

## 2020-08-14 ENCOUNTER — Other Ambulatory Visit: Payer: Self-pay

## 2020-08-14 ENCOUNTER — Ambulatory Visit: Payer: Medicare HMO | Attending: Gerontology

## 2020-08-14 ENCOUNTER — Ambulatory Visit: Payer: Medicare HMO | Admitting: Occupational Therapy

## 2020-08-14 ENCOUNTER — Encounter: Payer: Self-pay | Admitting: Occupational Therapy

## 2020-08-14 DIAGNOSIS — M6281 Muscle weakness (generalized): Secondary | ICD-10-CM | POA: Diagnosis present

## 2020-08-14 DIAGNOSIS — R278 Other lack of coordination: Secondary | ICD-10-CM | POA: Insufficient documentation

## 2020-08-14 DIAGNOSIS — R41841 Cognitive communication deficit: Secondary | ICD-10-CM | POA: Diagnosis present

## 2020-08-14 DIAGNOSIS — R2689 Other abnormalities of gait and mobility: Secondary | ICD-10-CM | POA: Insufficient documentation

## 2020-08-14 DIAGNOSIS — R2681 Unsteadiness on feet: Secondary | ICD-10-CM

## 2020-08-14 NOTE — Therapy (Signed)
[facility] MAIN [facility] SERVICES [address] [place], [place], [address] Phone: [phone]   Fax:  [phone]  Occupational Therapy Treatment  Patient Details  Name: Edward Campbell MRN: [id number] Date of Birth: 14-Mar-1941 Referring Provider (OT): Dr.  Rolin Barry   Encounter Date: 08/14/2020   OT End of Session - 08/14/20 1526    Visit Number 9    Number of Visits 24    Date for OT Re-Evaluation 09/12/20    Authorization Type FOTO    OT Start Time 1515    OT Stop Time 1600    OT Time Calculation (min) 45 min    Activity Tolerance Patient tolerated treatment well    Behavior During Therapy [facility] for tasks assessed/performed           Past Medical History:  Diagnosis Date  . Stroke [facility])     Past Surgical History:  Procedure Laterality Date  . BACK SURGERY      There were no vitals filed for this visit.   Subjective Assessment - 08/14/20 1525    Pertinent History Pt is an 79 y/o M with PMH: CVA with acute L sided weakness, numbness and dysarthria on 4/11 and 4/28 requiring hospitalization. Other pertinent medical history: includes HTN, HLD, and DVT.    Limitations LUE strength, motor control, and [facility] skills.    Currently in Pain? No/denies          OT TREATMENT    Neuro muscular re-education:  Pt. worked on left hand [facility] skills grasping, and manipulating Editor, commissioning tasks. Pt. initially attempted to use resistive tweezers to grasp, and use the 1" thin pegs. Pt. was unable to regulate the appropriate amount of pressure needed to place the pegs onto the pegboard. The resistive tweezers were changed to manual tweezers. Pt. required verbal cues, and visual demonstration for tweezer movement patterns.   Pt. continues to make progress overall. Pt. reports that he has been trying to use his left hand more during activities at home. Pt. reports that he is now reaching up to place dishes in the cabinet with his left hand. Pt.  required verbal, and tactile cues for grasp, and movement patterns when manipulating the tweezers. Pt. required verbal cues to avoid leaning to the left during task. Pt. requires increased time to complete the task. Pt. education was provided about opportunities to engage his left hand during ADLs, and IADL tasks at home. Pt. continues to work on improving overall LUE functioning during ADLs, and IADL tasks.                          OT Education - 08/14/20 1525    Education Details [facility]    Person(s) Educated Patient    Methods Explanation;Demonstration    Comprehension Verbalized understanding;Returned demonstration            OT Short Term Goals - 06/20/20 1757      OT SHORT TERM GOAL #1   Title Pt will be compliant with HEP with green theraputty to increase fine motor control and independence.    Baseline 06/20/2020, exercises reviewed.    Time 4    Period Weeks    Status New    Target Date 07/18/20             OT Long Term Goals - 06/20/20 1759      OT LONG TERM GOAL #1   Title Pt will increase FOTO  goal from 68 to 71 to improve performance of HH IADL tasks and overall QOL.    Baseline 7/14, FOTO score 68    Time 12    Period Weeks    Status New    Target Date 09/12/20      OT LONG TERM GOAL #2   Title Pt. will improve left grip strength by 10# to be able to improve his ability to hold ADL objects  in his hand.    Baseline 7/14 grip strength 59.67    Time 12    Period Weeks    Status New    Target Date 09/12/20      OT LONG TERM GOAL #3   Title Pt. will improve LUE motor control in order to independently reach up to place items on a shelf.    Baseline 7/14 limited gross motor control with reaching    Time 12    Period Weeks    Status New    Target Date 09/12/20      OT LONG TERM GOAL #4   Title Pt. will improve Left hand FMC skills by 3 sec. to be able to manipulate small objects independently for ADLs.    Baseline 7/14 L hand 39  seconds (R hand 27s)    Time 12    Period Weeks    Status New    Target Date 09/12/20                 Plan - 08/14/20 1526    Clinical Impression Statement Pt. continues to make progress overall. Pt. reports that he has been trying to use his left hand more during activities at home. Pt. reports that he is now reaching up to place dishes in the cabinet with his left hand. Pt. required verbal, and tactile cues for grasp, and movement patterns when manipulating the tweezers. Pt. required verbal cues to avoid leaning to the left during task. Pt. requires increased time to complete the task. Pt. education was provided about opportunities to engage his left hand during ADLs, and IADL tasks at home. Pt. continues to work on improving overall LUE functioning during ADLs, and IADL tasks.    OT Occupational Profile and History Detailed Assessment- Review of Records and additional review of physical, cognitive, psychosocial history related to current functional performance    Occupational performance deficits (Please refer to evaluation for details): ADL's;IADL's    Body Structure / Function / Physical Skills ADL;Balance;FMC;GMC;Coordination;IADL;Strength;UE functional use    Rehab Potential Excellent    Clinical Decision Making Limited treatment options, no task modification necessary    Comorbidities Affecting Occupational Performance: May have comorbidities impacting occupational performance    Modification or Assistance to Complete Evaluation  Min-Moderate modification of tasks or assist with assess necessary to complete eval    OT Frequency 2x / week    OT Duration 12 weeks    OT Treatment/Interventions Self-care/ADL training;Therapeutic exercise;Patient/family education;DME and/or AE instruction;Therapeutic activities;Neuromuscular education    Plan Therapeutic activities to enhance FMC/control, self-care re-training to modify as needed, neuromuscular education to enhance L side awareness as  able, patient education to enhance safety and carryover outside of therapy, Therapeutic exercise to improve strength as it pertains to safe and efficient performance of ADLs.    Consulted and Agree with Plan of Care Patient           Patient will benefit from skilled therapeutic intervention in order to improve the following deficits and impairments:   Body Structure /  Function / Physical Skills: ADL, Balance, FMC, GMC, Coordination, IADL, Strength, UE functional use       Visit Diagnosis: Muscle weakness (generalized)  Other lack of coordination    Problem List Patient Active Problem List   Diagnosis Date Noted  . Malnutrition of moderate degree 06/25/2020  . Fall   . Palliative care by specialist   . Subarachnoid hemorrhage (HCC) 06/23/2020  . HYPERCHOLESTEROLEMIA 12/04/2008  . 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 SUPERFICIAL PHLEBITIS 12/04/2008  . CHRONIC PROSTATITIS 12/04/2008  . PROSTATE SPECIFIC ANTIGEN, ELEVATED 12/04/2008    Olegario Messier, MS, OTR/L 08/14/2020, 3:32 PM  [facility] MAIN [facility] SERVICES [address] [place], [place], [address] Phone: [phone]   Fax:  [phone]  Name: Edward Campbell MRN: [id number] Date of Birth: [birth year]

## 2020-08-14 NOTE — Therapy (Signed)
[place] [facility] MAIN [facility] SERVICES [address] [place], [place], [address] Phone: [phone]   Fax:  [phone]  Physical Therapy Treatment  Patient Details  Name: Edward Campbell MRN: [id number] Date of Birth: 06-11-1941 Referring Provider (PT): Virgel Gess   Encounter Date: 08/14/2020   PT End of Session - 08/14/20 1451    Visit Number 6    Number of Visits 16    Date for PT Re-Evaluation 08/22/20    Authorization Type 6/10 re-eval 06/27/20    PT Start Time 1430    PT Stop Time 1514    PT Time Calculation (min) 44 min    Equipment Utilized During Treatment Gait belt    Activity Tolerance Patient tolerated treatment well    Behavior During Therapy [facility] for tasks assessed/performed           Past Medical History:  Diagnosis Date  . Stroke [facility])     Past Surgical History:  Procedure Laterality Date  . BACK SURGERY      There were no vitals filed for this visit.   Subjective Assessment - 08/14/20 1433    Subjective Patient reports no pain or falls since last session. Has not been to the gym , has been walking though.    Pertinent History Patient returning to PT after hospitalization 7/17-7/19. Was walking in the heat and fainted, bruising throughout body on L side.  Hasn't gone walking since returned home from hospitalization due to weakness. hx of prior R MCA stroke, HTN, HLD, and DVT. He had two ischemic strokes in <3 weeks (4/11 and 4/28)  with acute left arm weakness, numbness and dysarthria. Imaging on 4/12 positive for PFO with an WF > 55%, mild LVH and no thrombus. He also has an atrial septal aneurysm PMh includes  He had a driving safety test on 2/40/97 but was not cleared to return to driving yet. Has meals on wheels to help with lunch. Apartment complex has a gym    Limitations Lifting;Standing;Walking;House hold activities    How long can you sit comfortably? n/a    How long can you stand comfortably? not sure    How  long can you walk comfortably? unsteady now outside of house.    Patient Stated Goals to walk better and be more steady    Currently in Pain? No/denies               TREATMENT       Ther-ex    NuStep L2-3 x 4 minutes slight pain in L hip reported, reduced with reduction of level.    Standing exercises with 5# ankle weights (AW): with UE support  Hip flexion straight leg x 15 each LE;  SUE support Hip abduction x 10 each LE;    Hip extension x 10 each LE, cued to keep upright posture Heel/toe raises x 20 BLE.    Seated 5lb ankle weight: -LAQ 10x each LE 3 second holds   Seated ER/IR RTB around ankles green ball between ankles 10x each LE  Green ball between ankles LAQ 10x  10x STS with UE crossed over chest    Neuromuscular Re-education    Standing on airex pad with alternating 6" step taps without UE support x 10 BLE, cued to pick leg up higher to clear the step;   6" step airex pad modified tandem stance 2x 60 seconds each LE placement   6" step ups with foam pad underneath feet x multiple bouts,  cued for no UE support   Orange hurdle step over/back 10x each LE, SUE support    Ambulate in hallway with horizontal head turns 2x 86 ft; challenged turning to right Ambulate in hallway with vertical head turns 2x 86 ft  Ambulate with cues to looking to the R 86 ft,  Ambulate with head turned based on PT command, up,down, left, right 2x 86 ft; increased L foot drag  Seated toe taps to sticky note for coordination, spatial awareness, and reciprocating pattern 2x 30 seconds.    Pt educated throughout session about proper posture and technique with exercises. Improved exercise technique, movement at target joints, use of target muscles after min to mod verbal, visual, tactile cues.                          PT Education - 08/14/20 1434    Education Details exercise technique, body mechanics    Person(s) Educated Patient    Methods  Explanation;Demonstration;Tactile cues;Verbal cues    Comprehension Verbalized understanding;Returned demonstration;Verbal cues required;Tactile cues required            PT Short Term Goals - 06/27/20 1009      PT SHORT TERM GOAL #1   Title Patient will be independent in home exercise program to improve strength/mobility for better functional independence with ADLs    Baseline 7/1 HEP given    Time 2    Period Weeks    Status New    Target Date 07/11/20             PT Long Term Goals - 06/27/20 1009      PT LONG TERM GOAL #1   Title Patient will increase FOTO score to equal to or greater than 71 %   to demonstrate statistically significant improvement in mobility and quality of life.    Baseline 7/1: 63% 7/21: RECERT: 55.3%    Time 8    Period Weeks    Status New    Target Date 08/22/20      PT LONG TERM GOAL #2   Title Patient will demonstrate an improved Berg Balance Score of >47/56 as to demonstrate improved balance with ADLs such as sitting/standing and transfer balance and reduced fall risk.    Baseline 7/1: 41/56 7/21: RECERT 37/56    Time 8    Period Weeks    Status New    Target Date 08/22/20      PT LONG TERM GOAL #3   Title Patient will increase six minute walk test distance to >1500 for progression to community ambulator age norm and improve gait ability    Baseline 7/1: 1159 ft with excessive foot drag; 7/21: RECERT 965 ft    Time 8    Period Weeks    Status New    Target Date 08/22/20      PT LONG TERM GOAL #4   Title Patient will increase BLE gross strength to 4+/5 as to improve functional strength for independent gait, increased standing tolerance and increased ADL ability.    Baseline 7/1: see chart 7/21: RECERT see note    Time 8    Period Weeks    Status New    Target Date 08/22/20      PT LONG TERM GOAL #5   Title Patient will safely negotiate obstacles in a crowded room without running into object on his left, with good foot clearance, and  arm swing    Baseline  7/1: unable to 7/21: recert: unable    Time 8    Period Weeks    Status New    Target Date 08/22/20                 Plan - 08/14/20 1800    Clinical Impression Statement Patient is challenged with dual task intervention with regression of LLE foot drag/limited clearance with additional task added. Stabilization interventions tolerated well however cueing is required for sequencing. Frequent cueing for task orientation required due to verbose and tangential nature of patient. Pt encouraged to continue his HEP and follow-up as scheduled. Pt will benefit from PT services to address deficits in strength, balance, and mobility in order to return to full function at home.    Personal Factors and Comorbidities Age;Comorbidity 3+;Finances;Past/Current Experience;Transportation    Comorbidities stroke, HLD, lumbar radiculopathy, hypercholesterolemia, restless legs syndrome, raynaud's syndrome, superficial phlebitis, chronic prostatis,    Examination-Activity Limitations Bend;Carry;Hygiene/Grooming;Locomotion Level;Squat;Stairs;Stand;Transfers    Examination-Participation Restrictions Church;Cleaning;Community Activity;Driving;Volunteer;Shop;Yard Work    Advertising account planner    Rehab Potential Fair    PT Frequency 2x / week    PT Duration 8 weeks    PT Treatment/Interventions ADLs/Self Care Home Management;Aquatic Therapy;Biofeedback;Canalith Repostioning;Cryotherapy;Clinical cytogeneticist;Iontophoresis 4mg /ml Dexamethasone;Moist Heat;Ultrasound;Therapeutic exercise;Therapeutic activities;Functional mobility training;Stair training;DME Instruction;Balance training;Neuromuscular re-education;Patient/family education;Orthotic Fit/Training;Manual techniques;Passive range of motion;Dry needling;Energy conservation;Splinting;Taping;Vestibular;Vasopneumatic Device;Manual lymph drainage    PT Next Visit Plan ankle stability  exercises, spatial awareness for L side, dual task.    PT Home Exercise Plan see above    Consulted and Agree with Plan of Care Patient           Patient will benefit from skilled therapeutic intervention in order to improve the following deficits and impairments:  Abnormal gait, Decreased activity tolerance, Decreased balance, Decreased endurance, Decreased coordination, Decreased cognition, Decreased knowledge of use of DME, Decreased mobility, Decreased safety awareness, Difficulty walking, Decreased strength, Impaired flexibility, Impaired perceived functional ability, Impaired UE functional use, Impaired vision/preception, Postural dysfunction, Improper body mechanics  Visit Diagnosis: Muscle weakness (generalized)  Other lack of coordination  Unsteadiness on feet     Problem List Patient Active Problem List   Diagnosis Date Noted  . Malnutrition of moderate degree 06/25/2020  . Fall   . Palliative care by specialist   . Subarachnoid hemorrhage (HCC) 06/23/2020  . HYPERCHOLESTEROLEMIA 12/04/2008  . 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 SUPERFICIAL PHLEBITIS 12/04/2008  . CHRONIC PROSTATITIS 12/04/2008  . PROSTATE SPECIFIC ANTIGEN, ELEVATED 12/04/2008   12/06/2008, PT, DPT   08/14/2020, 6:03 PM  [place] [facility] MAIN [facility] SERVICES [address] [place], [place], [place] Phone: [phone]   Fax:  [phone]  Name: Edward Campbell MRN: Dortha Kern Date of Birth: 1941/06/13

## 2020-08-16 ENCOUNTER — Other Ambulatory Visit: Payer: Self-pay

## 2020-08-16 ENCOUNTER — Ambulatory Visit: Payer: Medicare HMO

## 2020-08-16 DIAGNOSIS — M6281 Muscle weakness (generalized): Secondary | ICD-10-CM | POA: Diagnosis not present

## 2020-08-16 DIAGNOSIS — R2681 Unsteadiness on feet: Secondary | ICD-10-CM

## 2020-08-16 NOTE — Therapy (Signed)
[place] [facility] MAIN [facility] SERVICES [address] [place], [place], [address] Phone: [phone]   Fax:  [phone]  Physical Therapy Treatment  Patient Details  Name: OAKLEY KOSSMAN MRN: [id number] Date of Birth: 08-16-1941 Referring Provider (PT): Virgel Gess   Encounter Date: 08/16/2020   PT End of Session - 08/16/20 1423    Visit Number 7    Number of Visits 16    Date for PT Re-Evaluation 08/22/20    Authorization Type 6/10 re-eval 06/27/20    PT Start Time 1430    PT Stop Time 1515    PT Time Calculation (min) 45 min    Equipment Utilized During Treatment Gait belt    Activity Tolerance Patient tolerated treatment well    Behavior During Therapy [facility] for tasks assessed/performed           Past Medical History:  Diagnosis Date  . Stroke [facility])     Past Surgical History:  Procedure Laterality Date  . BACK SURGERY      There were no vitals filed for this visit.   Subjective Assessment - 08/16/20 1423    Subjective Patient reports no pain or falls since last session. No pain reported upon arrival. No specific questions or concerns currently.    Pertinent History Patient returning to PT after hospitalization 7/17-7/19. Was walking in the heat and fainted, bruising throughout body on L side.  Hasn't gone walking since returned home from hospitalization due to weakness. hx of prior R MCA stroke, HTN, HLD, and DVT. He had two ischemic strokes in <3 weeks (4/11 and 4/28)  with acute left arm weakness, numbness and dysarthria. Imaging on 4/12 positive for PFO with an WF > 55%, mild LVH and no thrombus. He also has an atrial septal aneurysm PMh includes  He had a driving safety test on 6/76/19 but was not cleared to return to driving yet. Has meals on wheels to help with lunch. Apartment complex has a gym    Limitations Lifting;Standing;Walking;House hold activities    How long can you sit comfortably? n/a    How long can you stand  comfortably? not sure    How long can you walk comfortably? unsteady now outside of house.    Patient Stated Goals to walk better and be more steady    Currently in Pain? No/denies              TREATMENT       Ther-ex    NuStep L2-3 x 4 minutes slight pain in L hip reported, reduced with reduction of level.    Standing exercises with 5# ankle weights (AW): with UE support  Hip flexion marches x 20 each LE; Hip abduction x 20 each LE;    Hip extension x 20 each LE; HS curlss x 20 each LE; Heel/toe raises x 20 BLE;    Seated 5lb ankle weight: LAQ x 20 each LE;  Seated clams with manual resistance from therapist x 10; Sit to stand without UE support x 10;   Neuromuscular Re-education    6" orange hurdle step over forward/backward 10x each LE no UE support; 6" orange hurdle step over right and left 10x each direction no UE support;  Standing on airex pad with alternating 6" step taps without UE support x 10 BLE; 6" step airex pad modified tandem stance x 30 seconds each LE placement forward; Airex NBOS eyes open/closed x 30s each; Airex NBOS eyes open with horizontal and vertical  head turns x 30s each; 6" step ups with foam pad underneath feet and foam on step as well x 10 bouts leading with each LE, no UE support    Pt educated throughout session about proper posture and technique with exercises. Improved exercise technique, movement at target joints, use of target muscles after min to mod verbal, visual, tactile cues.      Pt demonstrates excellent motivation during session today. Performed both balance and strengthening exercises with patient today. Continued AW at 5#, showing increased strength. Continued with dynamic balance exercises such as hurdle step overs and step-ups. Cues needed to not use UE for support. Pt encouraged to continue his HEP and follow-up as scheduled. Pt will benefit from PT services to address deficits in strength, balance, and mobility in order to  return to full function at home.                                   PT Short Term Goals - 06/27/20 1009      PT SHORT TERM GOAL #1   Title Patient will be independent in home exercise program to improve strength/mobility for better functional independence with ADLs    Baseline 7/1 HEP given    Time 2    Period Weeks    Status New    Target Date 07/11/20             PT Long Term Goals - 06/27/20 1009      PT LONG TERM GOAL #1   Title Patient will increase FOTO score to equal to or greater than 71 %   to demonstrate statistically significant improvement in mobility and quality of life.    Baseline 7/1: 63% 7/21: RECERT: 55.3%    Time 8    Period Weeks    Status New    Target Date 08/22/20      PT LONG TERM GOAL #2   Title Patient will demonstrate an improved Berg Balance Score of >47/56 as to demonstrate improved balance with ADLs such as sitting/standing and transfer balance and reduced fall risk.    Baseline 7/1: 41/56 7/21: RECERT 37/56    Time 8    Period Weeks    Status New    Target Date 08/22/20      PT LONG TERM GOAL #3   Title Patient will increase six minute walk test distance to >1500 for progression to community ambulator age norm and improve gait ability    Baseline 7/1: 1159 ft with excessive foot drag; 7/21: RECERT 965 ft    Time 8    Period Weeks    Status New    Target Date 08/22/20      PT LONG TERM GOAL #4   Title Patient will increase BLE gross strength to 4+/5 as to improve functional strength for independent gait, increased standing tolerance and increased ADL ability.    Baseline 7/1: see chart 7/21: RECERT see note    Time 8    Period Weeks    Status New    Target Date 08/22/20      PT LONG TERM GOAL #5   Title Patient will safely negotiate obstacles in a crowded room without running into object on his left, with good foot clearance, and arm swing    Baseline 7/1: unable to 7/21: recert: unable    Time 8     Period Weeks    Status  New    Target Date 08/22/20                 Plan - 08/16/20 1423    Personal Factors and Comorbidities Age;Comorbidity 3+;Finances;Past/Current Experience;Transportation    Comorbidities stroke, HLD, lumbar radiculopathy, hypercholesterolemia, restless legs syndrome, raynaud's syndrome, superficial phlebitis, chronic prostatis,    Examination-Activity Limitations Bend;Carry;Hygiene/Grooming;Locomotion Level;Squat;Stairs;Stand;Transfers    Examination-Participation Restrictions Church;Cleaning;Community Activity;Driving;Volunteer;Shop;Yard Work    Advertising account planner    Rehab Potential Fair    PT Frequency 2x / week    PT Duration 8 weeks    PT Treatment/Interventions ADLs/Self Care Home Management;Aquatic Therapy;Biofeedback;Canalith Repostioning;Cryotherapy;Clinical cytogeneticist;Iontophoresis 4mg /ml Dexamethasone;Moist Heat;Ultrasound;Therapeutic exercise;Therapeutic activities;Functional mobility training;Stair training;DME Instruction;Balance training;Neuromuscular re-education;Patient/family education;Orthotic Fit/Training;Manual techniques;Passive range of motion;Dry needling;Energy conservation;Splinting;Taping;Vestibular;Vasopneumatic Device;Manual lymph drainage    PT Next Visit Plan ankle stability exercises, spatial awareness for L side, dual task.    PT Home Exercise Plan see above    Consulted and Agree with Plan of Care Patient           Patient will benefit from skilled therapeutic intervention in order to improve the following deficits and impairments:  Abnormal gait, Decreased activity tolerance, Decreased balance, Decreased endurance, Decreased coordination, Decreased cognition, Decreased knowledge of use of DME, Decreased mobility, Decreased safety awareness, Difficulty walking, Decreased strength, Impaired flexibility, Impaired perceived functional ability, Impaired UE functional use,  Impaired vision/preception, Postural dysfunction, Improper body mechanics  Visit Diagnosis: Muscle weakness (generalized)  Unsteadiness on feet     Problem List Patient Active Problem List   Diagnosis Date Noted  . Malnutrition of moderate degree 06/25/2020  . Fall   . Palliative care by specialist   . Subarachnoid hemorrhage (HCC) 06/23/2020  . HYPERCHOLESTEROLEMIA 12/04/2008  . 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 SUPERFICIAL PHLEBITIS 12/04/2008  . CHRONIC PROSTATITIS 12/04/2008  . PROSTATE SPECIFIC ANTIGEN, ELEVATED 12/04/2008   12/06/2008 PT, DPT, GCS  Genie Mirabal 08/16/2020, 3:29 PM  [place] [facility] MAIN [facility] SERVICES [address] [place], [place], [place] Phone: [phone]   Fax:  [phone]  Name: URIEL DOWDING MRN: Dortha Kern Date of Birth: 03-14-1941

## 2020-08-21 ENCOUNTER — Ambulatory Visit: Payer: Medicare HMO | Admitting: Occupational Therapy

## 2020-08-21 ENCOUNTER — Other Ambulatory Visit: Payer: Self-pay

## 2020-08-21 ENCOUNTER — Encounter: Payer: Self-pay | Admitting: Occupational Therapy

## 2020-08-21 DIAGNOSIS — M6281 Muscle weakness (generalized): Secondary | ICD-10-CM

## 2020-08-21 DIAGNOSIS — R278 Other lack of coordination: Secondary | ICD-10-CM

## 2020-08-21 DIAGNOSIS — R41841 Cognitive communication deficit: Secondary | ICD-10-CM

## 2020-08-21 NOTE — Therapy (Signed)
[facility] MAIN [facility] SERVICES [address] [place], [place], [address] Phone: [phone]   Fax:  [phone]  Occupational Therapy Treatment/Progress Update Reporting period from 06/20/2020 to 08/21/2020  Patient Details  Name: Edward Campbell MRN: [id number] Date of Birth: 01/22/1941 Referring Provider (OT): Dr.  Rolin Barry   Encounter Date: 08/21/2020   OT End of Session - 08/24/20 2033    Visit Number 10    Number of Visits 24    Date for OT Re-Evaluation 09/12/20    Authorization Type FOTO    Authorization Time Period Progress report period starting 06/20/2020    OT Start Time 1100    OT Stop Time 1145    OT Time Calculation (min) 45 min    Equipment Utilized During Treatment dynamometer, 9 hold peg test, pinch test.    Activity Tolerance Patient tolerated treatment well    Behavior During Therapy [facility] for tasks assessed/performed           Past Medical History:  Diagnosis Date   Stroke [facility])     Past Surgical History:  Procedure Laterality Date   BACK SURGERY      There were no vitals filed for this visit.   Subjective Assessment - 08/24/20 2032    Subjective  Pt reports he is doing well, would like to get back to driving if possible.  Feels buttons are a bit easier now than they used to be.    Pertinent History Pt is an 79 y/o M with PMH: CVA with acute L sided weakness, numbness and dysarthria on 4/11 and 4/28 requiring hospitalization. Other pertinent medical history: includes HTN, HLD, and DVT.    Limitations LUE strength, motor control, and [facility] skills.    Special Tests Vision: scanning in tact, peripheral vision in tact in all 4 quadrants, appropriate tracking and convergence.    Patient Stated Goals To improve coordination and ultimate goal is to drive again    Currently in Pain? No/denies    Pain Score 0-No pain              [facility] OT Assessment - 08/25/20 2035      Assessment   Medical Diagnosis CVA     Referring Provider (OT) Dr.  Rolin Barry    Onset Date/Surgical Date 03/18/20    Hand Dominance Right    Next MD Visit 04/02/2020    Prior Therapy seen by OT in this setting in April after initial stroke.       Coordination   Right 9 Hole Peg Test 27    Left 9 Hole Peg Test 37      Hand Function   Right Hand Grip (lbs) 85    Right Hand Lateral Pinch 20 lbs    Right Hand 3 Point Pinch 20 lbs    Left Hand Grip (lbs) 60    Left Hand Lateral Pinch 17 lbs    Left 3 point pinch 19 lbs          Therex: Continues to have difficulty with managing/pushing the vacuum cleaner.  Typing is still slower than before.   Patient seen for reassessment of skills for grip, pinch and coordination, refer to above flow sheet for details/ Goals updated to reflect current progress.     ADL: Pt reports he would like to improve his typing skills, instructed on use of typing test and drills from website so he can work on at home as well.  Typing test administered  for 5 mins, 7 WPM, 3 errors and net 6 WPM, multiple trials completed with average of 6 WPM.     Response to tx: Patient has continued to progress in all areas, improvements in grip, pinch and coordination skills as noted with measurements in flowsheet.  Pt performing typing test to establish current level of skill, he does not have a baseline level for typing but reports he is much slower now than he was prior to CVA.  Patient continues to benefit from skilled OT services to maximize safety and independence in necessary daily tasks.                OT Education - 08/24/20 2032    Education Details FMC, hand function    Person(s) Educated Patient    Methods Explanation;Demonstration    Comprehension Verbalized understanding;Returned demonstration            OT Short Term Goals - 06/20/20 1757      OT SHORT TERM GOAL #1   Title Pt will be compliant with HEP with green theraputty to increase fine motor control and independence.     Baseline 06/20/2020, exercises reviewed.    Time 4    Period Weeks    Status New    Target Date 07/18/20             OT Long Term Goals - 08/24/20 2033      OT LONG TERM GOAL #1   Title Pt will increase FOTO goal from 68 to 71 to improve performance of HH IADL tasks and overall QOL.    Baseline 7/14, FOTO score 68, 10th visit:  72    Time 12    Period Weeks    Status On-going    Target Date 09/12/20      OT LONG TERM GOAL #2   Title Pt. will improve left grip strength by 10# to be able to improve his ability to hold ADL objects  in his hand.    Baseline 7/14 grip strength 59.67, 10th visit 60#    Time 12    Period Weeks    Status On-going    Target Date 09/12/20      OT LONG TERM GOAL #3   Title Pt. will improve LUE motor control in order to independently reach up to place items on a shelf.    Baseline 7/14 limited gross motor control with reaching    Time 12    Period Weeks    Status On-going    Target Date 09/12/20      OT LONG TERM GOAL #4   Title Pt. will improve Left hand FMC skills by 3 sec. to be able to manipulate small objects independently for ADLs.    Baseline 7/14 L hand 39 seconds (R hand 27s) 10th  visit 37 secs left    Time 12    Period Weeks    Status On-going    Target Date 09/12/20                 Plan - 08/24/20 2033    Clinical Impression Statement Patient has continued to progress in all areas, improvements in grip, pinch and coordination skills as noted with measurements in flowsheet.  Pt performing typing test to establish current level of skill, he does not have a baseline level for typing but reports he is much slower now than he was prior to CVA.  Patient continues to benefit from skilled OT services to maximize safety and  independence in necessary daily tasks.    OT Occupational Profile and History Detailed Assessment- Review of Records and additional review of physical, cognitive, psychosocial history related to current functional  performance    Occupational performance deficits (Please refer to evaluation for details): ADL's;IADL's    Body Structure / Function / Physical Skills ADL;Balance;FMC;GMC;Coordination;IADL;Strength;UE functional use    Rehab Potential Excellent    Clinical Decision Making Limited treatment options, no task modification necessary    Comorbidities Affecting Occupational Performance: May have comorbidities impacting occupational performance    Modification or Assistance to Complete Evaluation  Min-Moderate modification of tasks or assist with assess necessary to complete eval    OT Frequency 2x / week    OT Duration 12 weeks    OT Treatment/Interventions Self-care/ADL training;Therapeutic exercise;Patient/family education;DME and/or AE instruction;Therapeutic activities;Neuromuscular education    Plan Therapeutic activities to enhance FMC/control, self-care re-training to modify as needed, neuromuscular education to enhance L side awareness as able, patient education to enhance safety and carryover outside of therapy, Therapeutic exercise to improve strength as it pertains to safe and efficient performance of ADLs.    Consulted and Agree with Plan of Care Patient           Patient will benefit from skilled therapeutic intervention in order to improve the following deficits and impairments:   Body Structure / Function / Physical Skills: ADL, Balance, FMC, GMC, Coordination, IADL, Strength, UE functional use       Visit Diagnosis: Muscle weakness (generalized)  Other lack of coordination  Cognitive communication deficit    Problem List Patient Active Problem List   Diagnosis Date Noted   Malnutrition of moderate degree 06/25/2020   Fall    Palliative care by specialist    Subarachnoid hemorrhage (HCC) 06/23/2020   HYPERCHOLESTEROLEMIA 12/04/2008   RESTLESS LEGS SYNDROME 12/04/2008   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 [place], [place], [address] Phone: [phone]   Fax:  [phone]  Name: Edward Campbell MRN: [id number] Date of Birth: [birth year]

## 2020-08-23 ENCOUNTER — Ambulatory Visit: Payer: Medicare HMO

## 2020-08-23 ENCOUNTER — Other Ambulatory Visit: Payer: Self-pay

## 2020-08-23 VITALS — BP 108/66 | HR 76

## 2020-08-23 DIAGNOSIS — M6281 Muscle weakness (generalized): Secondary | ICD-10-CM

## 2020-08-23 DIAGNOSIS — R2681 Unsteadiness on feet: Secondary | ICD-10-CM

## 2020-08-23 NOTE — Therapy (Signed)
[place] MAIN [facility] SERVICES [address] [place], [place], [address] Phone: [phone]   Fax:  [phone]  Physical Therapy Treatment/Recertification  Patient Details  Name: Edward Campbell MRN: [id number] Date of Birth: [birth year] Referring Provider (PT): Willette Cluster   Encounter Date: 08/23/2020   PT End of Session - 08/23/20 1134    Visit Number 8    Number of Visits 33    Date for PT Re-Evaluation 10/18/20    Authorization Type 6/10 re-eval 06/27/20    PT Start Time 1145    PT Stop Time 1230    PT Time Calculation (min) 45 min    Equipment Utilized During Treatment Gait belt    Activity Tolerance Patient tolerated treatment well    Behavior During Therapy [facility] for tasks assessed/performed           Past Medical History:  Diagnosis Date  . Stroke [facility])     Past Surgical History:  Procedure Laterality Date  . BACK SURGERY      Vitals:   08/23/20 1151  BP: 108/66  Pulse: 76  SpO2: 99%     Subjective Assessment - 08/23/20 1134    Subjective Patient reports no pain or falls since last session. No pain reported upon arrival. No specific questions or concerns currently.    Pertinent History Patient returning to PT after hospitalization 7/17-7/19. Was walking in the heat and fainted, bruising throughout body on L side.  Hasn't gone walking since returned home from hospitalization due to weakness. hx of prior R MCA stroke, HTN, HLD, and DVT. He had two ischemic strokes in <3 weeks (4/11 and 4/28)  with acute left arm weakness, numbness and dysarthria. Imaging on 4/12 positive for PFO with an WF > 55%, mild LVH and no thrombus. He also has an atrial septal aneurysm PMh includes  He had a driving safety test on 06/04/20 but was not cleared to return to driving yet. Has meals on wheels to help with lunch. Apartment complex has a gym    Limitations Lifting;Standing;Walking;House hold activities    How long can you sit comfortably?  n/a    How long can you stand comfortably? not sure    How long can you walk comfortably? unsteady now outside of house.    Patient Stated Goals to walk better and be more steady    Currently in Pain? No/denies              [facility] PT Assessment - 08/23/20 1155      6 Minute Walk- Baseline   6 Minute Walk- Baseline yes    BP (mmHg) 108/66    HR (bpm) 76    02 Sat (%RA) 99 %    Modified Borg Scale for Dyspnea 0- Nothing at all    Perceived Rate of Exertion (Borg) 6-      6 Minute walk- Post Test   6 Minute Walk Post Test yes    BP (mmHg) 153/66    HR (bpm) 97    02 Sat (%RA) 97 %    Modified Borg Scale for Dyspnea 6-    Perceived Rate of Exertion (Borg) 13- Somewhat hard      6 minute walk test results    Aerobic Endurance Distance Walked 1340    Endurance additional comments no assistive device, slight increase in foot drag toward end      [employer]   Sit to Stand Able to stand without using hands  and stabilize independently    Standing Unsupported Able to stand safely 2 minutes    Sitting with Back Unsupported but Feet Supported on Floor or Stool Able to sit safely and securely 2 minutes    Stand to Sit Sits safely with minimal use of hands    Transfers Able to transfer safely, minor use of hands    Standing Unsupported with Eyes Closed Able to stand 10 seconds safely    Standing Unsupported with Feet Together Able to place feet together independently and stand 1 minute safely    From Standing, Reach Forward with Outstretched Arm Can reach confidently >25 cm (10")    From Standing Position, Pick up Object from Floor Able to pick up shoe safely and easily    From Standing Position, Turn to Look Behind Over each Shoulder Looks behind from both sides and weight shifts well    Turn 360 Degrees Able to turn 360 degrees safely one side only in 4 seconds or less    Standing Unsupported, Alternately Place Feet on Step/Stool Able to stand independently and safely and  complete 8 steps in 20 seconds    Standing Unsupported, One Foot in Front Able to plae foot ahead of the other independently and hold 30 seconds    Standing on One Leg Able to lift leg independently and hold 5-10 seconds    Total Score 53                  TREATMENT   Neuromuscular Re-education  Performed 6MWT with patient who ambulated 1340' without assistive device and slight increase in L foot drag as distance increased (see above for vitals); BERG performed and pt scored 53/56; MMT performed with patient (see below):   STRENGTH:  Graded on a 0-5 scale Muscle Group Left Right  Hip Flex 4/5 4+/5  Hip Abd 4+/5 4-/5  Hip Add 3+/5 4+/5  Hip Ext 4-/5 4/5  Hip IR/ER 4/5 4/5  Knee Flex 4+/5 4+/5  Knee Ext 5/5 5/5  Ankle DF 4+/5 5/5  Ankle PF NT NT    Updated outcome measures and goals with patient today. Pt demonstrates very significant improvement in his outcome measures since they were last performed in July 2021. His 6MWT has increased to 1340' from 965.' His FOTO score improved to 66 and his BERG improved from 37/56 to 53/56. His LLE strength has also improved significantly however he is still having decreased L arm swing, intermittent L toe drag, and some L sided neglect. He would continue to benefit from PT services to address deficits in strength, balance, and mobility in order to return to full function at home.               PT Short Term Goals - 08/23/20 1136      PT SHORT TERM GOAL #1   Title Patient will be independent in home exercise program to improve strength/mobility for better functional independence with ADLs    Baseline 7/1 HEP given    Time 2    Period Weeks    Status New    Target Date 07/11/20             PT Long Term Goals - 08/23/20 1136      PT LONG TERM GOAL #1   Title Patient will increase FOTO score to equal to or greater than 71 %   to demonstrate statistically significant improvement in mobility and quality of life.     Baseline 7/1: 63%  9/32: RECERT: 67.1%; 2/45/80: 66    Time 8    Period Weeks    Status Partially Met    Target Date 10/18/20      PT LONG TERM GOAL #2   Title Patient will demonstrate an improved Berg Balance Score of >47/56 as to demonstrate improved balance with ADLs such as sitting/standing and transfer balance and reduced fall risk.    Baseline 7/1: 99/83 3/82: RECERT 50/53; 9/76/73: 53/56    Time 8    Period Weeks    Status Achieved      PT LONG TERM GOAL #3   Title Patient will increase six minute walk test distance to >1500 for progression to community ambulator age norm and improve gait ability    Baseline 7/1: 1159 ft with excessive foot drag; 4/19: RECERT 379 ft; 0/24/09: 1340'    Time 8    Period Weeks    Status Partially Met    Target Date 10/18/20      PT LONG TERM GOAL #4   Title Patient will increase BLE gross strength to 4+/5 as to improve functional strength for independent gait, increased standing tolerance and increased ADL ability.    Baseline 7/1: see chart 7/35: RECERT see note    Time 8    Period Weeks    Status New      PT LONG TERM GOAL #5   Title Patient will safely negotiate obstacles in a crowded room without running into object on his left, with good foot clearance, and arm swing    Baseline 7/1: unable to 3/29: recert: unable; 08/31/25: Not bumping into objects, still with decreased L arm swing as well as decreased L foot clearance    Time 8    Period Weeks    Status Partially Met    Target Date 10/18/20                 Plan - 08/23/20 1135    Clinical Impression Statement Updated outcome measures and goals with patient today. Pt demonstrates very significant improvement in his outcome measures since they were last performed in July 2021. His 6MWT has increased to 1340' from 965.' His FOTO score improved to 66 and his BERG improved from 37/56 to 53/56. His LLE strength has also improved significantly however he is still having decreased L arm  swing, intermittent L toe drag, and some L sided neglect. He would continue to benefit from PT services to address deficits in strength, balance, and mobility in order to return to full function at home.    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    PT Frequency 2x / week    PT Duration 8 weeks    PT Treatment/Interventions ADLs/Self Care Home Management;Aquatic Therapy;Biofeedback;Canalith Repostioning;Cryotherapy;Advice worker;Iontophoresis 67m/ml Dexamethasone;Moist Heat;Ultrasound;Therapeutic exercise;Therapeutic activities;Functional mobility training;Stair training;DME Instruction;Balance training;Neuromuscular re-education;Patient/family education;Orthotic Fit/Training;Manual techniques;Passive range of motion;Dry needling;Energy conservation;Splinting;Taping;Vestibular;Vasopneumatic Device;Manual lymph drainage    PT Next Visit Plan ankle stability exercises, spatial awareness for L side, dual task.    PT Home Exercise Plan see above    Consulted and Agree with Plan of Care Patient           Patient will benefit from skilled therapeutic intervention in  order to improve the following deficits and impairments:  Abnormal gait, Decreased activity tolerance, Decreased balance, Decreased endurance, Decreased coordination, Decreased cognition, Decreased knowledge of use of DME, Decreased mobility, Decreased safety awareness, Difficulty walking,  Decreased strength, Impaired flexibility, Impaired perceived functional ability, Impaired UE functional use, Impaired vision/preception, Postural dysfunction, Improper body mechanics  Visit Diagnosis: Muscle weakness (generalized)  Unsteadiness on feet     Problem List Patient Active Problem List   Diagnosis Date Noted  . Malnutrition of moderate degree 06/25/2020  . Fall   . Palliative care by specialist   . Subarachnoid hemorrhage ([place]) 06/23/2020  . HYPERCHOLESTEROLEMIA 12/04/2008  . 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 SUPERFICIAL PHLEBITIS 12/04/2008  . CHRONIC PROSTATITIS 12/04/2008  . PROSTATE SPECIFIC ANTIGEN, ELEVATED 12/04/2008   Phillips Grout PT, DPT, GCS  Abdoulie Tierce 08/23/2020, 2:15 PM  [place] MAIN [facility] SERVICES [address] [place], [place], [address] Phone: [phone]   Fax:  [phone]  Name: IMER FOXWORTH MRN: [id number] Date of Birth: [birth year]

## 2020-08-28 ENCOUNTER — Encounter: Payer: Self-pay | Admitting: Physical Therapy

## 2020-08-28 ENCOUNTER — Encounter: Payer: Self-pay | Admitting: Occupational Therapy

## 2020-08-28 ENCOUNTER — Ambulatory Visit: Payer: Medicare HMO | Admitting: Physical Therapy

## 2020-08-28 ENCOUNTER — Ambulatory Visit: Payer: Medicare HMO | Admitting: Occupational Therapy

## 2020-08-28 ENCOUNTER — Other Ambulatory Visit: Payer: Self-pay

## 2020-08-28 DIAGNOSIS — R2681 Unsteadiness on feet: Secondary | ICD-10-CM

## 2020-08-28 DIAGNOSIS — R278 Other lack of coordination: Secondary | ICD-10-CM

## 2020-08-28 DIAGNOSIS — M6281 Muscle weakness (generalized): Secondary | ICD-10-CM

## 2020-08-28 DIAGNOSIS — R2689 Other abnormalities of gait and mobility: Secondary | ICD-10-CM

## 2020-08-28 NOTE — Therapy (Signed)
[facility] MAIN [facility] SERVICES [address] [place], [place], [address] Phone: [phone]   Fax:  [phone]  Occupational Therapy Treatment  Patient Details  Name: Edward Campbell MRN: [id number] Date of Birth: April 02, 1941 Referring Provider (OT): Dr.  Rolin Barry   Encounter Date: 08/28/2020   OT End of Session - 08/28/20 1411    Visit Number 11    Number of Visits 24    Date for OT Re-Evaluation 09/12/20    Authorization Type FOTO    Authorization Time Period Progress report period starting 06/20/2020    OT Start Time 1015    OT Stop Time 1100    OT Time Calculation (min) 45 min    Activity Tolerance Patient tolerated treatment well    Behavior During Therapy [facility] for tasks assessed/performed           Past Medical History:  Diagnosis Date  . Stroke [facility])     Past Surgical History:  Procedure Laterality Date  . BACK SURGERY      There were no vitals filed for this visit.   Subjective Assessment - 08/28/20 1018    Subjective  Pt. reports that he hasn't had a chance to practice typing.    Pertinent History Pt is an 79 y/o M with PMH: CVA with acute L sided weakness, numbness and dysarthria on 4/11 and 4/28 requiring hospitalization. Other pertinent medical history: includes HTN, HLD, and DVT.    Limitations LUE strength, motor control, and [facility] skills.    Special Tests Vision: scanning in tact, peripheral vision in tact in all 4 quadrants, appropriate tracking and convergence.    Patient Stated Goals To improve coordination and ultimate goal is to drive again    Currently in Pain? No/denies          Pt. worked on typing skills while using bilateral hands simultaneously to complete. Pt. Presented with multiple mistypes, and frequent screen changes. Most mistypes occurred with the left hand. Pt. Used limited isolated digit movements when typing.   Pt. was unable to complete a 10 min. basic typing test today, as the pt.  unintentionally clicked out of the screen with his left hand on several occassions. The tying task was changed to typing, and copying text placed to his right flat at the tabletop, then ultimately moved vertically to the front right of the screen with increased success. Pt. continues to work on improving LUE strength, and [facility] skills in order to work towards improving, and maximizing independence with ADLs and IADLs.                       OT Education - 08/28/20 1411    Education Details FMC, hand function    Person(s) Educated Patient    Methods Explanation;Demonstration    Comprehension Verbalized understanding;Returned demonstration            OT Short Term Goals - 06/20/20 1757      OT SHORT TERM GOAL #1   Title Pt will be compliant with HEP with green theraputty to increase fine motor control and independence.    Baseline 06/20/2020, exercises reviewed.    Time 4    Period Weeks    Status New    Target Date 07/18/20             OT Long Term Goals - 08/24/20 2033      OT LONG TERM GOAL #1   Title Pt will increase  FOTO goal from 68 to 71 to improve performance of HH IADL tasks and overall QOL.    Baseline 7/14, FOTO score 68, 10th visit:  72    Time 12    Period Weeks    Status On-going    Target Date 09/12/20      OT LONG TERM GOAL #2   Title Pt. will improve left grip strength by 10# to be able to improve his ability to hold ADL objects  in his hand.    Baseline 7/14 grip strength 59.67, 10th visit 60#    Time 12    Period Weeks    Status On-going    Target Date 09/12/20      OT LONG TERM GOAL #3   Title Pt. will improve LUE motor control in order to independently reach up to place items on a shelf.    Baseline 7/14 limited gross motor control with reaching    Time 12    Period Weeks    Status On-going    Target Date 09/12/20      OT LONG TERM GOAL #4   Title Pt. will improve Left hand FMC skills by 3 sec. to be able to manipulate small  objects independently for ADLs.    Baseline 7/14 L hand 39 seconds (R hand 27s) 10th  visit 37 secs left    Time 12    Period Weeks    Status On-going    Target Date 09/12/20                 Plan - 08/28/20 1412    Clinical Impression Statement Pt. was unable to complete a 10 min. basic typing test today, as the pt. unintentionally clicked out of the screen with his left hand on several occassions. The tying task was changed to typing, and copying text placed to his right flat at the tabletop, then ultimately moved vertically to the front right of the screen with increased success. Pt. continues to work on improving LUE strength, and [facility] skills in order to work towards improving, and maximizing independence with ADLs and IADLs.    OT Occupational Profile and History Detailed Assessment- Review of Records and additional review of physical, cognitive, psychosocial history related to current functional performance    Occupational performance deficits (Please refer to evaluation for details): ADL's;IADL's    Body Structure / Function / Physical Skills ADL;Balance;FMC;GMC;Coordination;IADL;Strength;UE functional use    Rehab Potential Excellent    Clinical Decision Making Limited treatment options, no task modification necessary    Comorbidities Affecting Occupational Performance: May have comorbidities impacting occupational performance    Modification or Assistance to Complete Evaluation  Min-Moderate modification of tasks or assist with assess necessary to complete eval    OT Frequency 2x / week    OT Duration 12 weeks    OT Treatment/Interventions Self-care/ADL training;Therapeutic exercise;Patient/family education;DME and/or AE instruction;Therapeutic activities;Neuromuscular education    Plan Therapeutic activities to enhance FMC/control, self-care re-training to modify as needed, neuromuscular education to enhance L side awareness as able, patient education to enhance safety and  carryover outside of therapy, Therapeutic exercise to improve strength as it pertains to safe and efficient performance of ADLs.    OT Home Exercise Plan Use green putty to increase digit flex/ext strength, stretching wrist/digits and rest breaks between exercise to prevent cramping    Consulted and Agree with Plan of Care Patient           Patient will benefit from skilled therapeutic  intervention in order to improve the following deficits and impairments:   Body Structure / Function / Physical Skills: ADL, Balance, FMC, GMC, Coordination, IADL, Strength, UE functional use       Visit Diagnosis: Muscle weakness (generalized)  Other lack of coordination    Problem List Patient Active Problem List   Diagnosis Date Noted  . Malnutrition of moderate degree 06/25/2020  . Fall   . Palliative care by specialist   . Subarachnoid hemorrhage (HCC) 06/23/2020  . HYPERCHOLESTEROLEMIA 12/04/2008  . 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 SUPERFICIAL PHLEBITIS 12/04/2008  . CHRONIC PROSTATITIS 12/04/2008  . PROSTATE SPECIFIC ANTIGEN, ELEVATED 12/04/2008    Olegario Messier, MS, OTR/L 08/28/2020, 2:14 PM  [facility] MAIN [facility] SERVICES [address] [place], [place], [address] Phone: [phone]   Fax:  [phone]  Name: CURT OATIS MRN: [id number] Date of Birth: 06-19-1941

## 2020-08-28 NOTE — Therapy (Signed)
[facility] MAIN [facility] SERVICES [address] [place], [place], [address] Phone: [phone]   Fax:  [phone]  Physical Therapy Treatment  Patient Details  Name: DRAIDEN MIRSKY MRN: [id number] Date of Birth: Aug 12, 1941 Referring Provider (PT): Willette Cluster   Encounter Date: 08/28/2020   PT End of Session - 08/28/20 1106    Visit Number 9    Number of Visits 33    Date for PT Re-Evaluation 10/18/20    Authorization Type 6/10 re-eval 06/27/20    PT Start Time 1100    PT Stop Time 1140    PT Time Calculation (min) 40 min    Equipment Utilized During Treatment Gait belt    Activity Tolerance Patient tolerated treatment well    Behavior During Therapy WFL for tasks assessed/performed           Past Medical History:  Diagnosis Date   Stroke [facility])     Past Surgical History:  Procedure Laterality Date   BACK SURGERY      There were no vitals filed for this visit.   Subjective Assessment - 08/28/20 1107    Subjective Patient reports no pain or falls since last session. No pain reported upon arrival. No specific questions or concerns currently.    Pertinent History Patient returning to PT after hospitalization 7/17-7/19. Was walking in the heat and fainted, bruising throughout body on L side.  Hasn't gone walking since returned home from hospitalization due to weakness. hx of prior R MCA stroke, HTN, HLD, and DVT. He had two ischemic strokes in <3 weeks (4/11 and 4/28)  with acute left arm weakness, numbness and dysarthria. Imaging on 4/12 positive for PFO with an WF > 55%, mild LVH and no thrombus. He also has an atrial septal aneurysm PMh includes  He had a driving safety test on 06/04/20 but was not cleared to return to driving yet. Has meals on wheels to help with lunch. Apartment complex has a gym    Limitations Lifting;Standing;Walking;House hold activities    How long can you sit comfortably? n/a    How long can you stand  comfortably? not sure    How long can you walk comfortably? unsteady now outside of house.    Patient Stated Goals to walk better and be more steady    Currently in Pain? No/denies    Pain Score 0-No pain             Ther-ex  Nu-step  x 5 mins Standing marching x 20 with 4 lbs  Stepping fwd/bwd over hurdle , 4 lbs x 20   Hip flexion marches with 4# ankle weights x 10 bilateral; Hip abduction with 4# x 10 bilateral Hip extension with 4# x 10 bilateral Quantum leg press 40# x 20 x 3 sets  Sit to stand without UE support from regular height chair x 10   Lunges to BOSU ball x 15 BLE, 4 lbs  Tandem on floor and stool and trunk rotation x 20  step ups to 6-inch stool x 20  Eccentric step downs from 3 inch stool x 10 verbal cues to complete slow and tap heel.  BUE CGA Patient needs occasional verbal cueing to improve posture and cueing to correctly perform exercises slowly, holding at end of range to increase motor firing of desired muscle to encourage fatigue.  PT Education - 08/28/20 1107    Education Details HEP    Person(s) Educated Patient    Methods Explanation    Comprehension Verbalized understanding;Need further instruction            PT Short Term Goals - 08/23/20 1136      PT SHORT TERM GOAL #1   Title Patient will be independent in home exercise program to improve strength/mobility for better functional independence with ADLs    Baseline 7/1 HEP given    Time 2    Period Weeks    Status New    Target Date 07/11/20             PT Long Term Goals - 08/23/20 1136      PT LONG TERM GOAL #1   Title Patient will increase FOTO score to equal to or greater than 71 %   to demonstrate statistically significant improvement in mobility and quality of life.    Baseline 7/1: 80% 8/81: RECERT: 10.3%; 1/59/45: 66    Time 8    Period Weeks    Status Partially Met    Target Date 10/18/20      PT LONG TERM GOAL #2   Title  Patient will demonstrate an improved Berg Balance Score of >47/56 as to demonstrate improved balance with ADLs such as sitting/standing and transfer balance and reduced fall risk.    Baseline 7/1: 85/92 9/24: RECERT 46/28; 6/38/17: 53/56    Time 8    Period Weeks    Status Achieved      PT LONG TERM GOAL #3   Title Patient will increase six minute walk test distance to >1500 for progression to community ambulator age norm and improve gait ability    Baseline 7/1: 1159 ft with excessive foot drag; 7/11: RECERT 657 ft; 08/10/82: 1340'    Time 8    Period Weeks    Status Partially Met    Target Date 10/18/20      PT LONG TERM GOAL #4   Title Patient will increase BLE gross strength to 4+/5 as to improve functional strength for independent gait, increased standing tolerance and increased ADL ability.    Baseline 7/1: see chart 3/38: RECERT see note    Time 8    Period Weeks    Status New      PT LONG TERM GOAL #5   Title Patient will safely negotiate obstacles in a crowded room without running into object on his left, with good foot clearance, and arm swing    Baseline 7/1: unable to 3/29: recert: unable; 1/91/66: Not bumping into objects, still with decreased L arm swing as well as decreased L foot clearance    Time 8    Period Weeks    Status Partially Met    Target Date 10/18/20                 Plan - 08/28/20 1107    Clinical Impression Statement Patient instructed in beginning balance and coordination exercise. Patient required mod VCs and min A for gait to improve weight shift and postural control. Patient requires min VCs to improve ankle stability with gait.  Patients would benefit from additional skilled PT intervention to improve balance/gait safety and reduce fall risk.   Personal Factors and Comorbidities Age;Comorbidity 3+;Finances;Past/Current Experience;Transportation    Comorbidities stroke, HLD, lumbar radiculopathy, hypercholesterolemia, restless legs syndrome,  raynaud's syndrome, superficial phlebitis, chronic prostatis,    Examination-Activity Limitations Bend;Carry;Hygiene/Grooming;Locomotion Level;Squat;Stairs;Stand;Transfers  Examination-Participation Restrictions Church;Cleaning;Community Activity;Driving;Volunteer;Shop;Yard Work    Product manager    Rehab Potential Fair    PT Frequency 2x / week    PT Duration 8 weeks    PT Treatment/Interventions ADLs/Self Care Home Management;Aquatic Therapy;Biofeedback;Canalith Repostioning;Cryotherapy;Advice worker;Iontophoresis 59m/ml Dexamethasone;Moist Heat;Ultrasound;Therapeutic exercise;Therapeutic activities;Functional mobility training;Stair training;DME Instruction;Balance training;Neuromuscular re-education;Patient/family education;Orthotic Fit/Training;Manual techniques;Passive range of motion;Dry needling;Energy conservation;Splinting;Taping;Vestibular;Vasopneumatic Device;Manual lymph drainage    PT Next Visit Plan ankle stability exercises, spatial awareness for L side, dual task.    PT Home Exercise Plan see above    Consulted and Agree with Plan of Care Patient           Patient will benefit from skilled therapeutic intervention in order to improve the following deficits and impairments:  Abnormal gait, Decreased activity tolerance, Decreased balance, Decreased endurance, Decreased coordination, Decreased cognition, Decreased knowledge of use of DME, Decreased mobility, Decreased safety awareness, Difficulty walking, Decreased strength, Impaired flexibility, Impaired perceived functional ability, Impaired UE functional use, Impaired vision/preception, Postural dysfunction, Improper body mechanics  Visit Diagnosis: Muscle weakness (generalized)  Unsteadiness on feet  Other lack of coordination  Other abnormalities of gait and mobility     Problem List Patient Active Problem List   Diagnosis Date Noted    Malnutrition of moderate degree 06/25/2020   Fall    Palliative care by specialist    [place] 06/23[place]   HYPERCHOLESTEROLEMIA 12/04/2008   RESTLESS LEGS SYNDROME 12/04/2008   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

## 2020-08-29 NOTE — Addendum Note (Signed)
Addended by: Ria Comment D on: 08/29/2020 08:24 AM   Modules accepted: Orders

## 2020-08-30 ENCOUNTER — Other Ambulatory Visit: Payer: Self-pay

## 2020-08-30 ENCOUNTER — Ambulatory Visit: Payer: Medicare HMO

## 2020-08-30 DIAGNOSIS — M6281 Muscle weakness (generalized): Secondary | ICD-10-CM | POA: Diagnosis not present

## 2020-08-30 DIAGNOSIS — R2681 Unsteadiness on feet: Secondary | ICD-10-CM

## 2020-08-30 NOTE — Therapy (Signed)
[place] MAIN [facility] SERVICES [address] [place], [place], [address] Phone: [phone]   Fax:  [phone]  Physical Therapy Progress Note   Dates of reporting period  06/27/20   to   08/30/20  Patient Details  Name: Edward Campbell MRN: [id number] Date of Birth: Jun 11, 1941 Referring Provider (PT): Willette Cluster   Encounter Date: 08/30/2020   PT End of Session - 08/30/20 1153    Visit Number 10    Number of Visits 33    Date for PT Re-Evaluation 10/18/20    Authorization Type initial eval: 6/10; re-eval: 06/27/20; PN: 08/30/20    PT Start Time 1147    PT Stop Time 1230    PT Time Calculation (min) 43 min    Equipment Utilized During Treatment Gait belt    Activity Tolerance Patient tolerated treatment well    Behavior During Therapy WFL for tasks assessed/performed           Past Medical History:  Diagnosis Date  . Stroke [facility])     Past Surgical History:  Procedure Laterality Date  . BACK SURGERY      There were no vitals filed for this visit.   Subjective Assessment - 08/30/20 1152    Subjective Patient reports no stumbles or falls since last session. No pain reported upon arrival. No changes in health or medications. No specific questions or concerns currently.    Pertinent History Patient returning to PT after hospitalization 7/17-7/19. Was walking in the heat and fainted, bruising throughout body on L side.  Hasn't gone walking since returned home from hospitalization due to weakness. hx of prior R MCA stroke, HTN, HLD, and DVT. He had two ischemic strokes in <3 weeks (4/11 and 4/28)  with acute left arm weakness, numbness and dysarthria. Imaging on 4/12 positive for PFO with an WF > 55%, mild LVH and no thrombus. He also has an atrial septal aneurysm PMh includes  He had a driving safety test on 06/04/20 but was not cleared to return to driving yet. Has meals on wheels to help with lunch. Apartment complex has a gym     Limitations Lifting;Standing;Walking;House hold activities    How long can you sit comfortably? n/a    How long can you stand comfortably? not sure    How long can you walk comfortably? unsteady now outside of house.    Patient Stated Goals to walk better and be more steady    Currently in Pain? No/denies             TREATMENT   Ther-ex  Nu-step L3-4 for warm-up during history x 5 minutes (4 minutes unbilled): Precor BLE leg press 55# 2 x 20; Precor BLE heel raises 40# 2 x 30;    Standing marching 5# ankle weights x 20 BLE;   Hip flexion marches with 5# ankle weights x 20 bilateral; Hip abduction with 5# x 20 bilateral Hip extension with 5# x 20 bilateral  Sit to stand without UE support from regular height chair x 10     Neuromuscular Re-education  Airex NBOS eyes open/closed without UE support x 30s each; Airex NBOS eyes open with horizontal and vertical head turns x 30s each; Airex alternating 6" step taps without UE support x 10 BLE; Alteranting 6" step-ups without UE support    Pt educated throughout session about proper posture and technique with exercises. Improved exercise technique, movement at target joints, use of target muscles after min to  mod verbal, visual, tactile cues.    Updated outcome measures and goals with patient on 08/23/20 so they were not updated again today. At that time pt demonstrated very significant improvement in his outcome measures compared to when they were previously completed in July 2021. His 6MWT had increased to 1340' from 965.' His FOTO score improved to 66 and his BERG improved from 37/56 to 53/56. His LLE strength has also improved significantly however he is still having decreased L arm swing, intermittent L toe drag, and some L sided neglect. He has not achieved maximal benefit from PT at this time and he would continue to benefit from PT services to address deficits in strength, balance, and mobility in order to return to full  function at home.                         PT Short Term Goals - 08/30/20 1153      PT SHORT TERM GOAL #1   Title Patient will be independent in home exercise program to improve strength/mobility for better functional independence with ADLs    Baseline 7/1 HEP given    Time 2    Period Weeks    Status On-going    Target Date 07/11/20             PT Long Term Goals - 08/30/20 1154      PT LONG TERM GOAL #1   Title Patient will increase FOTO score to equal to or greater than 71 %   to demonstrate statistically significant improvement in mobility and quality of life.    Baseline 7/1: 45% 0/38: RECERT: 88.2%; 8/00/34: 66    Time 8    Period Weeks    Status Partially Met    Target Date 10/18/20      PT LONG TERM GOAL #2   Title Patient will demonstrate an improved Berg Balance Score of >47/56 as to demonstrate improved balance with ADLs such as sitting/standing and transfer balance and reduced fall risk.    Baseline 7/1: 91/79 1/50: RECERT 56/97; 9/48/01: 53/56    Time 8    Period Weeks    Status Achieved      PT LONG TERM GOAL #3   Title Patient will increase six minute walk test distance to >1500 for progression to community ambulator age norm and improve gait ability    Baseline 7/1: 1159 ft with excessive foot drag; 6/55: RECERT 374 ft; 08/03/06: 1340'    Time 8    Period Weeks    Status Partially Met    Target Date 10/18/20      PT LONG TERM GOAL #4   Title Patient will increase BLE gross strength to 4+/5 as to improve functional strength for independent gait, increased standing tolerance and increased ADL ability.    Baseline 7/1: see chart 8/67: RECERT see note; 5/44/92: see note;    Time 8    Period Weeks    Status On-going    Target Date 10/18/20      PT LONG TERM GOAL #5   Title Patient will safely negotiate obstacles in a crowded room without running into object on his left, with good foot clearance, and arm swing    Baseline 7/1: unable to  0/10: recert: unable; 0/71/21: Not bumping into objects, still with decreased L arm swing as well as decreased L foot clearance    Time 8    Period Weeks    Status  Partially Met    Target Date 10/18/20                 Plan - 08/30/20 1153    Clinical Impression Statement Updated outcome measures and goals with patient on 08/23/20 so they were not updated again today. At that time pt demonstrated very significant improvement in his outcome measures compared to when they were previously completed in July 2021. His 6MWT had increased to 1340' from 965.' His FOTO score improved to 66 and his BERG improved from 37/56 to 53/56. His LLE strength has also improved significantly however he is still having decreased L arm swing, intermittent L toe drag, and some L sided neglect. He has not achieved maximal benefit from PT at this time and he would continue to benefit from PT services to address deficits in strength, balance, and mobility in order to return to full function at home.    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    PT Frequency 2x / week    PT Duration 8 weeks    PT Treatment/Interventions ADLs/Self Care Home Management;Aquatic Therapy;Biofeedback;Canalith Repostioning;Cryotherapy;Advice worker;Iontophoresis 109m/ml Dexamethasone;Moist Heat;Ultrasound;Therapeutic exercise;Therapeutic activities;Functional mobility training;Stair training;DME  Instruction;Balance training;Neuromuscular re-education;Patient/family education;Orthotic Fit/Training;Manual techniques;Passive range of motion;Dry needling;Energy conservation;Splinting;Taping;Vestibular;Vasopneumatic Device;Manual lymph drainage    PT Next Visit Plan ankle stability exercises, spatial awareness for L side, dual task.    PT Home Exercise Plan see above    Consulted and Agree with Plan of Care Patient           Patient will benefit from skilled therapeutic intervention in order to improve the following deficits and impairments:  Abnormal gait, Decreased activity tolerance, Decreased balance, Decreased endurance, Decreased coordination, Decreased cognition, Decreased knowledge of use of DME, Decreased mobility, Decreased safety awareness, Difficulty walking, Decreased strength, Impaired flexibility, Impaired perceived functional ability, Impaired UE functional use, Impaired vision/preception, Postural dysfunction, Improper body mechanics  Visit Diagnosis: Muscle weakness (generalized)  Unsteadiness on feet     Problem List Patient Active Problem List   Diagnosis Date Noted  . Malnutrition of moderate degree 06/25/2020  . Fall   . Palliative care by specialist   . [place] 06/23/2020  . HYPERCHOLESTEROLEMIA 12/04/2008  . 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 SUPERFICIAL PHLEBITIS 12/04/2008  . CHRONIC PROSTATITIS 12/04/2008  . PROSTATE SPECIFIC ANTIGEN, ELEVATED 12/04/2008   Edward Campbell, Edward Campbell, Edward Campbell  Edward Campbell 08/30/2020, 2:01 PM  Edward Campbell  Fax:  Campbell Name: Edward Campbell/29

## 2020-09-03 ENCOUNTER — Ambulatory Visit: Payer: Medicare HMO | Admitting: Occupational Therapy

## 2020-09-03 ENCOUNTER — Encounter: Payer: Self-pay | Admitting: Occupational Therapy

## 2020-09-03 ENCOUNTER — Ambulatory Visit: Payer: Medicare HMO

## 2020-09-03 ENCOUNTER — Other Ambulatory Visit: Payer: Self-pay

## 2020-09-03 DIAGNOSIS — M6281 Muscle weakness (generalized): Secondary | ICD-10-CM | POA: Diagnosis not present

## 2020-09-03 DIAGNOSIS — R2681 Unsteadiness on feet: Secondary | ICD-10-CM

## 2020-09-03 DIAGNOSIS — R278 Other lack of coordination: Secondary | ICD-10-CM

## 2020-09-03 NOTE — Therapy (Signed)
[place] [facility] MAIN [facility] SERVICES [address] [place], [place], [address] Phone: [phone]   Fax:  [phone]  Occupational Therapy Treatment  Patient Details  Name: Edward Campbell MRN: [id number] Date of Birth: 24-Nov-1941 Referring Provider (OT): Dr.  Rolin Barry   Encounter Date: 09/03/2020   OT End of Session - 09/03/20 1032    Visit Number 12    Number of Visits 24    Date for OT Re-Evaluation 09/12/20    Authorization Type FOTO    Authorization Time Period Progress report period starting 06/20/2020    OT Start Time 1015    OT Stop Time 1100    OT Time Calculation (min) 45 min    Activity Tolerance Patient tolerated treatment well    Behavior During Therapy [facility] for tasks assessed/performed           Past Medical History:  Diagnosis Date  . Stroke [facility])     Past Surgical History:  Procedure Laterality Date  . BACK SURGERY      There were no vitals filed for this visit.   Subjective Assessment - 09/03/20 1030    Subjective  Pt. reports that he tried to work on typing at home.    Pertinent History Pt is an 79 y/o M with PMH: CVA with acute L sided weakness, numbness and dysarthria on 4/11 and 4/28 requiring hospitalization. Other pertinent medical history: includes HTN, HLD, and DVT.    Limitations LUE strength, motor control, and [facility] skills.    Special Tests Vision: scanning in tact, peripheral vision in tact in all 4 quadrants, appropriate tracking and convergence.    Currently in Pain? No/denies          OT TREATMENT   Neuro muscular re-education:  Pt. worked on left hand [facility] skills grasping, and manipulating Editor, commissioning tasks. Pt. was able to to use resistive tweezers to grasp, and use the 1" thin pegs. Pt. with improved regulation of the appropriate amount of pressure needed to place the pegs onto the pegboard. Pt. required verbal cues, and visual demonstration for tweezer movement patterns.   Pt.  continues to make progress overall. Pt. reports that he is initiating more during tasks at home, and is now initiating more during home. Pt. reports that he is now reaching up to place dishes in the cabinet with his left hand. Pt. was able to manipulating the tweezers with more accuracy. Minimal compensation proximally. Pt. requires increased time to complete the task. Pt. education was provided about opportunities to engage his left hand during ADLs, and IADL tasks at home. Pt. continues to work on improving overall LUE functioning during ADLs, and IADL tasks.                        OT Education - 09/03/20 1032    Education Details FMC, hand function    Person(s) Educated Patient    Methods Explanation;Demonstration    Comprehension Verbalized understanding;Returned demonstration            OT Short Term Goals - 06/20/20 1757      OT SHORT TERM GOAL #1   Title Pt will be compliant with HEP with green theraputty to increase fine motor control and independence.    Baseline 06/20/2020, exercises reviewed.    Time 4    Period Weeks    Status New    Target Date 07/18/20  OT Long Term Goals - 08/24/20 2033      OT LONG TERM GOAL #1   Title Pt will increase FOTO goal from 68 to 71 to improve performance of HH IADL tasks and overall QOL.    Baseline 7/14, FOTO score 68, 10th visit:  72    Time 12    Period Weeks    Status On-going    Target Date 09/12/20      OT LONG TERM GOAL #2   Title Pt. will improve left grip strength by 10# to be able to improve his ability to hold ADL objects  in his hand.    Baseline 7/14 grip strength 59.67, 10th visit 60#    Time 12    Period Weeks    Status On-going    Target Date 09/12/20      OT LONG TERM GOAL #3   Title Pt. will improve LUE motor control in order to independently reach up to place items on a shelf.    Baseline 7/14 limited gross motor control with reaching    Time 12    Period Weeks    Status  On-going    Target Date 09/12/20      OT LONG TERM GOAL #4   Title Pt. will improve Left hand FMC skills by 3 sec. to be able to manipulate small objects independently for ADLs.    Baseline 7/14 L hand 39 seconds (R hand 27s) 10th  visit 37 secs left    Time 12    Period Weeks    Status On-going    Target Date 09/12/20                 Plan - 09/03/20 1033    Clinical Impression Statement Pt. continues to make progress overall. Pt. reports that he is initiating more during tasks at home, and is now initiating more during home. Pt. reports that he is now reaching up to place dishes in the cabinet with his left hand. Pt. was able to manipulating the tweezers with more accuracy. Minimal compensation proximally. Pt. requires increased time to complete the task. Pt. education was provided about opportunities to engage his left hand during ADLs, and IADL tasks at home. Pt. continues to work on improving overall LUE functioning during ADLs, and IADL tasks.   OT Occupational Profile and History Detailed Assessment- Review of Records and additional review of physical, cognitive, psychosocial history related to current functional performance    Occupational performance deficits (Please refer to evaluation for details): ADL's;IADL's    Body Structure / Function / Physical Skills ADL;Balance;FMC;GMC;Coordination;IADL;Strength;UE functional use    Rehab Potential Excellent    Clinical Decision Making Limited treatment options, no task modification necessary    Comorbidities Affecting Occupational Performance: May have comorbidities impacting occupational performance    Modification or Assistance to Complete Evaluation  Min-Moderate modification of tasks or assist with assess necessary to complete eval    OT Frequency 2x / week    OT Duration 12 weeks    OT Treatment/Interventions Self-care/ADL training;Therapeutic exercise;Patient/family education;DME and/or AE instruction;Therapeutic  activities;Neuromuscular education    Plan Therapeutic activities to enhance FMC/control, self-care re-training to modify as needed, neuromuscular education to enhance L side awareness as able, patient education to enhance safety and carryover outside of therapy, Therapeutic exercise to improve strength as it pertains to safe and efficient performance of ADLs.    Consulted and Agree with Plan of Care Patient  Patient will benefit from skilled therapeutic intervention in order to improve the following deficits and impairments:   Body Structure / Function / Physical Skills: ADL, Balance, FMC, GMC, Coordination, IADL, Strength, UE functional use       Visit Diagnosis: Muscle weakness (generalized)  Other lack of coordination    Problem List Patient Active Problem List   Diagnosis Date Noted  . Malnutrition of moderate degree 06/25/2020  . Fall   . Palliative care by specialist   . Subarachnoid hemorrhage (H[facility]) 06/23/2020  . HYPERCHOLESTEROLEMIA 12/04/2008  . 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 SUPERFICIAL PHLEBITIS 12/04/2008  . CHRONIC PROSTATITIS 12/04/2008  . PROSTATE SPECIFIC ANTIGEN, ELEVATED 12/04/2008    Olegario Messier, MS, OTR/L 09/03/2020, 10:37 AM  [place] [facility] MAIN [facility] SERVICES [address] [place], [place], [address] Phone: [phone]   Fax:  [phone]  Name: Edward Campbell MRN: [id number] Date of Birth: 1941-05-13

## 2020-09-03 NOTE — Therapy (Signed)
[place] MAIN [facility] SERVICES [address] [place], [place], [address] Phone: [phone]   Fax:  [phone]  Physical Therapy Treatment  Patient Details  Name: Edward Campbell MRN: [id number] Date of Birth: June 11, 1941 Referring Provider (PT): Willette Cluster   Encounter Date: 09/03/2020   PT End of Session - 09/03/20 0932    Visit Number 11    Number of Visits 33    Date for PT Re-Evaluation 10/18/20    Authorization Type initial eval: 6/10; re-eval: 06/27/20; PN: 08/30/20    PT Start Time 0930    PT Stop Time 1015    PT Time Calculation (min) 45 min    Equipment Utilized During Treatment Gait belt    Activity Tolerance Patient tolerated treatment well    Behavior During Therapy [facility] for tasks assessed/performed           Past Medical History:  Diagnosis Date  . Stroke [facility])     Past Surgical History:  Procedure Laterality Date  . BACK SURGERY      There were no vitals filed for this visit.   Subjective Assessment - 09/03/20 0931    Subjective Patient reports no stumbles or falls since last session. No pain reported upon arrival but he did have some soreness/cramping in his L calf after last therapy session. No changes in health or medications. No specific questions or concerns currently.    Pertinent History Patient returning to PT after hospitalization 7/17-7/19. Was walking in the heat and fainted, bruising throughout body on L side.  Hasn't gone walking since returned home from hospitalization due to weakness. hx of prior R MCA stroke, HTN, HLD, and DVT. He had two ischemic strokes in <3 weeks (4/11 and 4/28)  with acute left arm weakness, numbness and dysarthria. Imaging on 4/12 positive for PFO with an WF > 55%, mild LVH and no thrombus. He also has an atrial septal aneurysm PMh includes  He had a driving safety test on 06/04/20 but was not cleared to return to driving yet. Has meals on wheels to help with lunch. Apartment  complex has a gym    Limitations Lifting;Standing;Walking;House hold activities    How long can you sit comfortably? n/a    How long can you stand comfortably? not sure    How long can you walk comfortably? unsteady now outside of house.    Patient Stated Goals to walk better and be more steady    Currently in Pain? No/denies              TREATMENT   Ther-ex Octane xRide L2-3 for warm-up during history x 5 minutes (4 minutes unbilled): Precor BLE leg press 70# x 22, ; Sit to stand without UE support from regular height chair Airex pad under feet 2 x 10;     Seated blue tband resisted L ankle dorsiflexion x 20; Standing marching 5# ankle weights x 20 BLE;   Seated LAQ 5# ankle weights x 20 BLE; Hip abduction with 5# x 20 BLE; Hip extension with 5# x 20 BLE; HS curls with 5# x 20 BLE;   Neuromuscular Re-education 6 inch step taps on Airex pad on top leading with left lower extremity without upper extremity support x10; Forward Bosu lunges (round side up) leading with LLE without UEs support x10; Bosu balance (round side up) without UEs support x60 seconds; 1/2 foam roll (flat side up) tandem balance without UE support alternating forward LE 2 x 30s each;  1/2 foam roll (flat side up) A/P balance without UE support x 30s; 1/2 foam roll (flat side up) A/P heel/toe rocking without UE support x 30s;   Pt educated throughout session about proper posture and technique with exercises. Improved exercise technique, movement at target joints, use of target muscles after min to mod verbal, visual, tactile cues.    Pt demonstrates excellent motivation during session today. Performed both balance and strengtheningexercises with patient today.   Progressed resistance on the leg press today and continued with 5 pound ankle weights for standing exercises however increased number of repetitions. Continued with dynamic balance exercises as well. He has not achieved maximal benefit  from PT at this time and he would continue tobenefit from PT services to address deficits in strength, balance, and mobility in order to return to full function at home.                           PT Short Term Goals - 08/30/20 1153      PT SHORT TERM GOAL #1   Title Patient will be independent in home exercise program to improve strength/mobility for better functional independence with ADLs    Baseline 7/1 HEP given    Time 2    Period Weeks    Status On-going    Target Date 07/11/20             PT Long Term Goals - 08/30/20 1154      PT LONG TERM GOAL #1   Title Patient will increase FOTO score to equal to or greater than 71 %   to demonstrate statistically significant improvement in mobility and quality of life.    Baseline 7/1: 63% 3/35: RECERT: 45.6%; 2/56/38: 66    Time 8    Period Weeks    Status Partially Met    Target Date 10/18/20      PT LONG TERM GOAL #2   Title Patient will demonstrate an improved Berg Balance Score of >47/56 as to demonstrate improved balance with ADLs such as sitting/standing and transfer balance and reduced fall risk.    Baseline 7/1: 93/73 4/28: RECERT 76/81; 1/57/26: 53/56    Time 8    Period Weeks    Status Achieved      PT LONG TERM GOAL #3   Title Patient will increase six minute walk test distance to >1500 for progression to community ambulator age norm and improve gait ability    Baseline 7/1: 1159 ft with excessive foot drag; 2/03: RECERT 559 ft; 7/41/63: 1340'    Time 8    Period Weeks    Status Partially Met    Target Date 10/18/20      PT LONG TERM GOAL #4   Title Patient will increase BLE gross strength to 4+/5 as to improve functional strength for independent gait, increased standing tolerance and increased ADL ability.    Baseline 7/1: see chart 8/45: RECERT see note; 3/64/68: see note;    Time 8    Period Weeks    Status On-going    Target Date 10/18/20      PT LONG TERM GOAL #5   Title Patient  will safely negotiate obstacles in a crowded room without running into object on his left, with good foot clearance, and arm swing    Baseline 7/1: unable to 0/32: recert: unable; 12/29/46: Not bumping into objects, still with decreased L arm swing as well as decreased L  foot clearance    Time 8    Period Weeks    Status Partially Met    Target Date 10/18/20                 Plan - 09/03/20 0933    Clinical Impression Statement Pt demonstrates excellent motivation during session today. Performed both balance and strengthening exercises with patient today.   Progressed resistance on the leg press today and continued with 5 pound ankle weights for standing exercises however increased number of repetitions. Continued with dynamic balance exercises as well. He has not achieved maximal benefit from PT at this time and he would continue to benefit from PT services to address deficits in strength, balance, and mobility in order to return to full function at home.    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    PT Frequency 2x / week    PT Duration 8 weeks    PT Treatment/Interventions ADLs/Self Care Home Management;Aquatic Therapy;Biofeedback;Canalith Repostioning;Cryotherapy;Advice worker;Iontophoresis 59m/ml Dexamethasone;Moist Heat;Ultrasound;Therapeutic exercise;Therapeutic activities;Functional mobility training;Stair training;DME Instruction;Balance  training;Neuromuscular re-education;Patient/family education;Orthotic Fit/Training;Manual techniques;Passive range of motion;Dry needling;Energy conservation;Splinting;Taping;Vestibular;Vasopneumatic Device;Manual lymph drainage    PT Next Visit Plan ankle stability exercises, spatial awareness for L side, dual task.    PT Home Exercise Plan see above    Consulted and Agree with Plan of Care Patient           Patient will benefit from skilled therapeutic intervention in order to improve the following deficits and impairments:  Abnormal gait, Decreased activity tolerance, Decreased balance, Decreased endurance, Decreased coordination, Decreased cognition, Decreased knowledge of use of DME, Decreased mobility, Decreased safety awareness, Difficulty walking, Decreased strength, Impaired flexibility, Impaired perceived functional ability, Impaired UE functional use, Impaired vision/preception, Postural dysfunction, Improper body mechanics  Visit Diagnosis: Muscle weakness (generalized)  Unsteadiness on feet     Problem List Patient Active Problem List   Diagnosis Date Noted  . Malnutrition of moderate degree 06/25/2020  . Fall   . Palliative care by specialist   . [place] 06/[place]  . HYPERCHOLESTEROLEMIA 12/04/2008  . 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 SUPERFICIAL PHLEBITIS 12/04/2008  . CHRONIC PROSTATITIS 12/04/2008  . PROSTATE SPECIFIC ANTIGEN, ELEVATED 12/04/2008   JPhillips GroutPT, DPT, GCS  Christpher Stogsdill 09/03/2020, 10:17 AM  [place] [facility] [address] [address]Phone: [phone]  Fax:  [phone] Name: Edward HIGASHIMRN: [id number] of Birth: [birth year]

## 2020-09-06 ENCOUNTER — Ambulatory Visit: Payer: Medicare HMO | Admitting: Occupational Therapy

## 2020-09-06 ENCOUNTER — Ambulatory Visit: Payer: Medicare HMO

## 2020-09-10 ENCOUNTER — Other Ambulatory Visit: Payer: Self-pay

## 2020-09-10 ENCOUNTER — Ambulatory Visit: Payer: Medicare HMO | Attending: Family Medicine

## 2020-09-10 DIAGNOSIS — R278 Other lack of coordination: Secondary | ICD-10-CM | POA: Diagnosis present

## 2020-09-10 DIAGNOSIS — M6281 Muscle weakness (generalized): Secondary | ICD-10-CM | POA: Diagnosis present

## 2020-09-10 DIAGNOSIS — R2681 Unsteadiness on feet: Secondary | ICD-10-CM | POA: Diagnosis present

## 2020-09-10 DIAGNOSIS — R41841 Cognitive communication deficit: Secondary | ICD-10-CM | POA: Insufficient documentation

## 2020-09-10 DIAGNOSIS — R2689 Other abnormalities of gait and mobility: Secondary | ICD-10-CM | POA: Insufficient documentation

## 2020-09-10 NOTE — Therapy (Signed)
[place] MAIN [facility] SERVICES [address] [place], [place], [address] Phone: [phone]   Fax:  [phone]  Physical Therapy Treatment  Patient Details  Name: Edward Campbell MRN: [id number] Date of Birth: 09-20-1941 Referring Provider (PT): Willette Cluster   Encounter Date: 09/10/2020   PT End of Session - 09/10/20 1304    Visit Number 12    Number of Visits 33    Date for PT Re-Evaluation 10/18/20    Authorization Type initial eval: 6/10; re-eval: 06/27/20; PN: 08/30/20    PT Start Time 1258    PT Stop Time 1340    PT Time Calculation (min) 42 min    Equipment Utilized During Treatment Gait belt    Activity Tolerance Patient tolerated treatment well;No increased pain    Behavior During Therapy WFL for tasks assessed/performed           Past Medical History:  Diagnosis Date  . Stroke [facility])     Past Surgical History:  Procedure Laterality Date  . BACK SURGERY      There were no vitals filed for this visit.   Subjective Assessment - 09/10/20 1302    Subjective Pt doing well today, reports he had a new implantable cardiac monitor on previous Wednesday, some soreness in the arms, otherwise no issue. He remains    Pertinent History Patient returning to PT after hospitalization 7/17-7/19. Was walking in the heat and fainted, bruising throughout body on L side.  Hasn't gone walking since returned home from hospitalization due to weakness. hx of prior R MCA stroke, HTN, HLD, and DVT. He had two ischemic strokes in <3 weeks (4/11 and 4/28)  with acute left arm weakness, numbness and dysarthria. Imaging on 4/12 positive for PFO with an WF > 55%, mild LVH and no thrombus. He also has an atrial septal aneurysm PMh includes  He had a driving safety test on 06/04/20 but was not cleared to return to driving yet. Has meals on wheels to help with lunch. Apartment complex has a gym    Currently in Pain? No/denies          INTERVENTION THIS  DATE:  Ther-ex  Octane xRide L2-3 for warm-up during history x 5 minutes  Precor BLE leg press 70# x 20 Sit to stand without UE support from regular height chair Airex pad under feet 2X10;      Seated blue t band resisted L ankle dorsiflexion x 20; Standing marching 5# ankle weights x 20 BLE;   Seated LAQ 5# ankle weights x 20 BLE; HS curls with 5# x 20 BLE; Hip abduction with 5# x 20 BLE; Hip extension with 5# x 20 BLE;    Neuromuscular Re-education  6 inch step taps on Airex pad on top leading with left lower extremity without upper extremity support x25; airex pad stance blue weighted ball self toss/catch x25 Retro AMB with blue weighted ball x37f (2 LOB)  FWD AMB c blue weighted ball x479f    Pt educated throughout session about proper posture and technique with exercises. Improved exercise technique, movement at target joints, use of target muscles after min to mod verbal, visual, tactile cues.        PT Short Term Goals - 08/30/20 1153      PT SHORT TERM GOAL #1   Title Patient will be independent in home exercise program to improve strength/mobility for better functional independence with ADLs    Baseline 7/1 HEP given  Time 2    Period Weeks    Status On-going    Target Date 07/11/20             PT Long Term Goals - 08/30/20 1154      PT LONG TERM GOAL #1   Title Patient will increase FOTO score to equal to or greater than 71 %   to demonstrate statistically significant improvement in mobility and quality of life.    Baseline 7/1: 12% 2/48: RECERT: 25.0%; 0/37/04: 66    Time 8    Period Weeks    Status Partially Met    Target Date 10/18/20      PT LONG TERM GOAL #2   Title Patient will demonstrate an improved Berg Balance Score of >47/56 as to demonstrate improved balance with ADLs such as sitting/standing and transfer balance and reduced fall risk.    Baseline 7/1: 88/89 1/69: RECERT 45/03; 8/88/28: 53/56    Time 8    Period Weeks    Status  Achieved      PT LONG TERM GOAL #3   Title Patient will increase six minute walk test distance to >1500 for progression to community ambulator age norm and improve gait ability    Baseline 7/1: 1159 ft with excessive foot drag; 0/03: RECERT 491 ft; 7/91/50: 1340'    Time 8    Period Weeks    Status Partially Met    Target Date 10/18/20      PT LONG TERM GOAL #4   Title Patient will increase BLE gross strength to 4+/5 as to improve functional strength for independent gait, increased standing tolerance and increased ADL ability.    Baseline 7/1: see chart 5/69: RECERT see note; 7/94/80: see note;    Time 8    Period Weeks    Status On-going    Target Date 10/18/20      PT LONG TERM GOAL #5   Title Patient will safely negotiate obstacles in a crowded room without running into object on his left, with good foot clearance, and arm swing    Baseline 7/1: unable to 1/65: recert: unable; 5/37/48: Not bumping into objects, still with decreased L arm swing as well as decreased L foot clearance    Time 8    Period Weeks    Status Partially Met    Target Date 10/18/20                 Plan - 09/10/20 1305    Clinical Impression Statement Continued with current POC. Pt remains motivated adn focused. Pt conitnues to make steady, gradual progress toward goals overall. Pt demos good safety awareness, uses UE support when needed, but generally has no observable LOB, makes efforts to maintain activity within controllable means.    Personal Factors and Comorbidities Age;Comorbidity 3+;Finances;Past/Current Experience;Transportation    Comorbidities stroke, HLD, lumbar radiculopathy, hypercholesterolemia, restless legs syndrome, raynaud's syndrome, superficial phlebitis, chronic prostatis,    Examination-Activity Limitations Bend;Carry;Hygiene/Grooming;Locomotion Level;Squat;Stairs;Stand;Transfers    Examination-Participation Restrictions Church;Cleaning;Community  Activity;Driving;Volunteer;Shop;Yard Work    Merchant navy officer Evolving/Moderate complexity    Clinical Decision Making Moderate    Rehab Potential Fair    PT Frequency 2x / week    PT Duration 8 weeks    PT Treatment/Interventions ADLs/Self Care Home Management;Aquatic Therapy;Biofeedback;Canalith Repostioning;Cryotherapy;Advice worker;Iontophoresis 41m/ml Dexamethasone;Moist Heat;Ultrasound;Therapeutic exercise;Therapeutic activities;Functional mobility training;Stair training;DME Instruction;Balance training;Neuromuscular re-education;Patient/family education;Orthotic Fit/Training;Manual techniques;Passive range of motion;Dry needling;Energy conservation;Splinting;Taping;Vestibular;Vasopneumatic Device;Manual lymph drainage    PT Next Visit Plan ankle stability exercises,  spatial awareness for L side, dual task.    PT Home Exercise Plan see above    Consulted and Agree with Plan of Care Patient           Patient will benefit from skilled therapeutic intervention in order to improve the following deficits and impairments:  Abnormal gait, Decreased activity tolerance, Decreased balance, Decreased endurance, Decreased coordination, Decreased cognition, Decreased knowledge of use of DME, Decreased mobility, Decreased safety awareness, Difficulty walking, Decreased strength, Impaired flexibility, Impaired perceived functional ability, Impaired UE functional use, Impaired vision/preception, Postural dysfunction, Improper body mechanics  Visit Diagnosis: Muscle weakness (generalized)  Other lack of coordination  Unsteadiness on feet  Other abnormalities of gait and mobility     Problem List Patient Active Problem List   Diagnosis Date Noted  . Malnutrition of moderate degree 06/25/2020  . Fall   . Palliative care by specialist   . Subarachnoid hemorrhage ([place]) 06/23/2020  . HYPERCHOLESTEROLEMIA 12/04/2008  . RESTLESS LEGS SYNDROME 12/04/2008   . 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 SUPERFICIAL PHLEBITIS 12/04/2008  . CHRONIC PROSTATITIS 12/04/2008  . PROSTATE SPECIFIC ANTIGEN, ELEVATED 12/04/2008   1:46 PM, 09/10/20 Etta Grandchild, PT, DPT Physical Therapist - [facility]  [phone] ([address]    Jessie C 09/10/2020, 1:07 PM  [place] MAIN [facility] SERVICES [address] [place], [place], [address] Phone: [phone]   Fax:  [phone]  Name: Edward Campbell MRN: [id number] Date of Birth: May 24, 1941

## 2020-09-12 ENCOUNTER — Ambulatory Visit: Payer: Medicare HMO | Admitting: Occupational Therapy

## 2020-09-12 ENCOUNTER — Other Ambulatory Visit: Payer: Self-pay

## 2020-09-12 ENCOUNTER — Encounter: Payer: Self-pay | Admitting: Occupational Therapy

## 2020-09-12 ENCOUNTER — Ambulatory Visit: Payer: Medicare HMO

## 2020-09-12 DIAGNOSIS — M6281 Muscle weakness (generalized): Secondary | ICD-10-CM | POA: Diagnosis not present

## 2020-09-12 DIAGNOSIS — R278 Other lack of coordination: Secondary | ICD-10-CM

## 2020-09-12 DIAGNOSIS — R2681 Unsteadiness on feet: Secondary | ICD-10-CM

## 2020-09-12 DIAGNOSIS — R2689 Other abnormalities of gait and mobility: Secondary | ICD-10-CM

## 2020-09-12 DIAGNOSIS — R41841 Cognitive communication deficit: Secondary | ICD-10-CM

## 2020-09-12 NOTE — Therapy (Signed)
[place] MAIN [facility] SERVICES [address] [place], [place], [address] Phone: [phone]   Fax:  [phone]  Physical Therapy Treatment  Patient Details  Name: Edward Campbell MRN: [id number] Date of Birth: 10/11/1941 Referring Provider (PT): Willette Cluster   Encounter Date: 09/12/2020   PT End of Session - 09/12/20 1358    Visit Number 13    Number of Visits 33    Date for PT Re-Evaluation 10/18/20    Authorization Type initial eval: 6/10; re-eval: 06/27/20; PN: 08/30/20    PT Start Time 1345    PT Stop Time 1425    PT Time Calculation (min) 40 min    Equipment Utilized During Treatment Gait belt    Activity Tolerance Patient tolerated treatment well;No increased pain    Behavior During Therapy WFL for tasks assessed/performed           Past Medical History:  Diagnosis Date  . Stroke [facility])     Past Surgical History:  Procedure Laterality Date  . BACK SURGERY      There were no vitals filed for this visit.   Subjective Assessment - 09/12/20 1354    Subjective Pt doing well, no pain today. Felt a good workout afyter last session, but no excessive discomfrot or lingering soreness.    Pertinent History Patient returning to PT after hospitalization 7/17-7/19. Was walking in the heat and fainted, bruising throughout body on L side.  Hasn't gone walking since returned home from hospitalization due to weakness. hx of prior R MCA stroke, HTN, HLD, and DVT. He had two ischemic strokes in <3 weeks (4/11 and 4/28)  with acute left arm weakness, numbness and dysarthria. Imaging on 4/12 positive for PFO with an WF > 55%, mild LVH and no thrombus. He also has an atrial septal aneurysm PMh includes  He had a driving safety test on 06/04/20 but was not cleared to return to driving yet. Has meals on wheels to help with lunch. Apartment complex has a gym          INTERVENTION THIS DATE: -8 minutes AMB (see detail below)  -Tandem stance 3x30sec  bilat // bars (supervision level)  -Narrow foam stance vertical headturns 15x3secH  -SLS @ // bars 8x10secH bilat, alternating sides  -Narrow foam stance horizontal head turns 1x20, alternating sides  -agility ladder side stepping 2x roud trips (minGaurd)  -agility ladder fwd/side/back/side ( MinGaurd)  -agility ladder diagonal FWD/BACK (2 RT) minGuar dot minA (much more challenging to step length)       [facility] PT Assessment - 09/12/20 0001      6 Minute Walk- Baseline   6 Minute Walk- Baseline yes   1426f, no assistive device            [facility] Adult PT Treatment/Exercise - 09/12/20 0001      Ambulation/Gait   Ambulation Distance (Feet) 1930 Feet    Assistive device None    Gait Pattern Poor foot clearance - left   Left lateral shift, mild LLE cross over;               PT Short Term Goals - 08/30/20 1153      PT SHORT TERM GOAL #1   Title Patient will be independent in home exercise program to improve strength/mobility for better functional independence with ADLs    Baseline 7/1 HEP given    Time 2    Period Weeks    Status On-going    Target  Date 07/11/20             PT Long Term Goals - 08/30/20 1154      PT LONG TERM GOAL #1   Title Patient will increase FOTO score to equal to or greater than 71 %   to demonstrate statistically significant improvement in mobility and quality of life.    Baseline 7/1: 03% 0/09: RECERT: 23.3%; 0/07/62: 66    Time 8    Period Weeks    Status Partially Met    Target Date 10/18/20      PT LONG TERM GOAL #2   Title Patient will demonstrate an improved Berg Balance Score of >47/56 as to demonstrate improved balance with ADLs such as sitting/standing and transfer balance and reduced fall risk.    Baseline 7/1: 26/33 3/54: RECERT 56/25; 6/38/93: 53/56    Time 8    Period Weeks    Status Achieved      PT LONG TERM GOAL #3   Title Patient will increase six minute walk test distance to >1500 for progression to community  ambulator age norm and improve gait ability    Baseline 7/1: 1159 ft with excessive foot drag; 7/34: RECERT 287 ft; 6/81/15: 1340'    Time 8    Period Weeks    Status Partially Met    Target Date 10/18/20      PT LONG TERM GOAL #4   Title Patient will increase BLE gross strength to 4+/5 as to improve functional strength for independent gait, increased standing tolerance and increased ADL ability.    Baseline 7/1: see chart 7/26: RECERT see note; 01/10/54: see note;    Time 8    Period Weeks    Status On-going    Target Date 10/18/20      PT LONG TERM GOAL #5   Title Patient will safely negotiate obstacles in a crowded room without running into object on his left, with good foot clearance, and arm swing    Baseline 7/1: unable to 9/74: recert: unable; 1/63/84: Not bumping into objects, still with decreased L arm swing as well as decreased L foot clearance    Time 8    Period Weeks    Status Partially Met    Target Date 10/18/20                 Plan - 09/12/20 1359    Clinical Impression Statement Continued with POC. Transitioned to overground AMB rather than Nustep, as patient has AMB goals for DC. Pt tolerates well, has fluctuating LLE foot clearance difficulty, but can correct somewhat with fluctuating attendance. Pt does well with AMB distance this date, but asymetru persists, may consider AFO at DC for long distance/exercise walking. Pt does well with agility ladder steppig drills, challenging to sequence and step length.    Personal Factors and Comorbidities Age;Comorbidity 3+;Finances;Past/Current Experience;Transportation    Comorbidities stroke, HLD, lumbar radiculopathy, hypercholesterolemia, restless legs syndrome, raynaud's syndrome, superficial phlebitis, chronic prostatis,    Examination-Activity Limitations Bend;Carry;Hygiene/Grooming;Locomotion Level;Squat;Stairs;Stand;Transfers    Examination-Participation Restrictions Church;Cleaning;Community  Activity;Driving;Volunteer;Shop;Yard Work    [employer] Evolving/Moderate complexity    Clinical Decision Making Moderate    Rehab Potential Fair    PT Frequency 2x / week    PT Duration 8 weeks    PT Treatment/Interventions ADLs/Self Care Home Management;Aquatic Therapy;Biofeedback;Canalith Repostioning;Cryotherapy;Advice worker;Iontophoresis 88m/ml Dexamethasone;Moist Heat;Ultrasound;Therapeutic exercise;Therapeutic activities;Functional mobility training;Stair training;DME Instruction;Balance training;Neuromuscular re-education;Patient/family education;Orthotic Fit/Training;Manual techniques;Passive range of motion;Dry needling;Energy conservation;Splinting;Taping;Vestibular;Vasopneumatic Device;Manual lymph drainage  PT Next Visit Plan ankle stability exercises, spatial awareness for L side, dual task.    PT Home Exercise Plan see above    Consulted and Agree with Plan of Care Patient           Patient will benefit from skilled therapeutic intervention in order to improve the following deficits and impairments:  Abnormal gait, Decreased activity tolerance, Decreased balance, Decreased endurance, Decreased coordination, Decreased cognition, Decreased knowledge of use of DME, Decreased mobility, Decreased safety awareness, Difficulty walking, Decreased strength, Impaired flexibility, Impaired perceived functional ability, Impaired UE functional use, Impaired vision/preception, Postural dysfunction, Improper body mechanics  Visit Diagnosis: Muscle weakness (generalized)  Other lack of coordination  Unsteadiness on feet  Other abnormalities of gait and mobility  Cognitive communication deficit     Problem List Patient Active Problem List   Diagnosis Date Noted  . Malnutrition of moderate degree 06/25/2020  . Fall   . Palliative care by specialist   . Subarachnoid hemorrhage ([place]) 06/23/2020  . HYPERCHOLESTEROLEMIA 12/04/2008  .  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 SUPERFICIAL PHLEBITIS 12/04/2008  . CHRONIC PROSTATITIS 12/04/2008  . PROSTATE SPECIFIC ANTIGEN, ELEVATED 12/04/2008   2:30 PM, 09/12/20 Etta Grandchild, PT, DPT Physical Therapist - [place] [facility] 09/12/2020, 2:16 PM  [place] MAIN [facility] SERVICES [address] [place], [place], [address] Phone: [phone]   Fax:  [phone]  Name: Edward Campbell MRN: [id number] Date of Birth: 1941/03/04

## 2020-09-12 NOTE — Therapy (Signed)
[facility] MAIN [facility] SERVICES [address] [place], [place], [address] Phone: [phone]   Fax:  [phone]  Occupational Therapy Treatment/Recertification Note  Patient Details  Name: Edward Campbell MRN: [id number] Date of Birth: 1941-09-10 Referring Provider (OT): Dr.  Rolin Barry   Encounter Date: 09/12/2020   OT End of Session - 09/12/20 1309    Visit Number 13    Number of Visits 24    Date for OT Re-Evaluation 12/05/20    Authorization Time Period Progress report period starting 06/20/2020    OT Start Time 1300    OT Stop Time 1345    OT Time Calculation (min) 45 min    Activity Tolerance Patient tolerated treatment well    Behavior During Therapy [facility] for tasks assessed/performed           Past Medical History:  Diagnosis Date  . Stroke [facility])     Past Surgical History:  Procedure Laterality Date  . BACK SURGERY      There were no vitals filed for this visit.   Subjective Assessment - 09/12/20 1307    Subjective  Pt. has had a cardiac monitor placed in his chest last week.    Pertinent History Pt is an 79 y/o M with PMH: CVA with acute L sided weakness, numbness and dysarthria on 4/11 and 4/28 requiring hospitalization. Other pertinent medical history: includes HTN, HLD, and DVT.    Special Tests Vision: scanning in tact, peripheral vision in tact in all 4 quadrants, appropriate tracking and convergence.    Currently in Pain? No/denies              [facility] OT Assessment - 09/12/20 0001      Coordination   Right 9 Hole Peg Test 24    Left 9 Hole Peg Test 37      Strength   Overall Strength Comments LUE strength 4/5,  RUE 5/5      Hand Function   Right Hand Grip (lbs) 85    Right Hand Lateral Pinch 20 lbs    Right Hand 3 Point Pinch 21 lbs    Left Hand Grip (lbs) 60    Left Hand Lateral Pinch 17 lbs    Left 3 point pinch 19 lbs          Measurements were obtained, and goals were reviewed with the pt.   Pt. continues to make steady progress with LUE, and hand function skills. Pt. Is now initiating movement, and engaging his left hand more during tasks at home. Pt. Is consistently using his left hand to reach up, and place place several plates at once onto shelves. Pt. Continues to present with limitations with left grip strength, pinch strength, motor control, and [facility] skills. Pt. continues to work on these skills in order to be able to type an email correspondence accurately, and efficiently, use his fingers to separate sheets of papers when managing bills, and use his hand to perform IADL tasks.                   OT Education - 09/12/20 1309    Education Details FMC, hand function    Person(s) Educated Patient    Methods Explanation;Demonstration    Comprehension Verbalized understanding;Returned demonstration            OT Short Term Goals - 06/20/20 1757      OT SHORT TERM GOAL #1   Title Pt will be compliant with  HEP with green theraputty to increase fine motor control and independence.    Baseline 06/20/2020, exercises reviewed.    Time 4    Period Weeks    Status New    Target Date 07/18/20             OT Long Term Goals - 09/12/20 1321      OT LONG TERM GOAL #1   Title Pt will increase FOTO goal from 68 to 71 to improve performance of HH IADL tasks and overall QOL.    Baseline 7/14, FOTO score 68, 10th visit:  72    Time 12    Period Weeks    Status On-going    Target Date 12/05/20      OT LONG TERM GOAL #2   Title Pt. will improve left grip strength by 10# to be able to improve his ability to hold ADL objects  in his hand.    Baseline 10.05/2020: 60#    Time 12    Period Weeks    Status On-going    Target Date 12/05/20      OT LONG TERM GOAL #3   Title Pt. will improve LUE motor control in order to independently reach up to place items on a shelf.    Baseline 12/05/2020: Pt. is now able to reach up to place multiple items at one time on a shelf  with hid left hand.    Time 12    Period Weeks    Status On-going    Target Date 12/05/20      OT LONG TERM GOAL #4   Title Pt. will improve Left hand FMC skills by 3 sec. to be able to manipulate small objects independently for ADLs.    Baseline 7/14 L hand 39 seconds (R hand 27 sec.) 10th  visit 37 secs left 09/12/2020: Left 37 sec., Right 24 sec.    Time 12    Period Weeks    Status On-going    Target Date 12/05/20      OT LONG TERM GOAL #5   Title Pt. will improve left hand function to be able to independently, and efficiently separate multiple sheets of papers during bill management.    Baseline 09/12/2020:Pt. has difficulty performing.    Time 12    Period Weeks    Status New    Target Date 12/05/20      Long Term Additional Goals   Additional Long Term Goals Yes      OT LONG TERM GOAL #6   Title Pt. will independently type a one paragraph email correspondence accurately, and efficiently.    Baseline 09/12/2020: Pt has difficulty typing a one paragraph email correspondence efficiently.    Time 12    Period Weeks    Status New    Target Date 12/05/20                 Plan - 09/12/20 1310    Clinical Impression Statement Measurements were obtained, and goals were reviewed with the pt.  Pt. continues to make steady progress with LUE, and hand function skills. Pt. Is now initiating movement, and engaging his left hand more during tasks at home. Pt. is consistently using his left hand to reach up, and place place several plates at once onto shelves. Pt. Continues to present with limitations with left grip strength, pinch strength, motor control, and [facility] skills. Pt. continues to work on these skills in order to be able to type an  email correspondence accurately, and efficiently, use his fingers to separate sheets of papers when managing bills, and use his hand to perform IADL tasks.    OT Occupational Profile and History Detailed Assessment- Review of Records and additional  review of physical, cognitive, psychosocial history related to current functional performance    Occupational performance deficits (Please refer to evaluation for details): ADL's;IADL's    Body Structure / Function / Physical Skills ADL;Balance;FMC;GMC;Coordination;IADL;Strength;UE functional use    Rehab Potential Excellent    Clinical Decision Making Limited treatment options, no task modification necessary    Comorbidities Affecting Occupational Performance: May have comorbidities impacting occupational performance    Modification or Assistance to Complete Evaluation  Min-Moderate modification of tasks or assist with assess necessary to complete eval    OT Frequency 2x / week    OT Duration 12 weeks    OT Treatment/Interventions Self-care/ADL training;Therapeutic exercise;Patient/family education;DME and/or AE instruction;Therapeutic activities;Neuromuscular education    Plan Therapeutic activities to enhance FMC/control, self-care re-training to modify as needed, neuromuscular education to enhance L side awareness as able, patient education to enhance safety and carryover outside of therapy, Therapeutic exercise to improve strength as it pertains to safe and efficient performance of ADLs.    Consulted and Agree with Plan of Care Patient           Patient will benefit from skilled therapeutic intervention in order to improve the following deficits and impairments:   Body Structure / Function / Physical Skills: ADL, Balance, FMC, GMC, Coordination, IADL, Strength, UE functional use       Visit Diagnosis: Muscle weakness (generalized)  Other lack of coordination    Problem List Patient Active Problem List   Diagnosis Date Noted  . Malnutrition of moderate degree 06/25/2020  . Fall   . Palliative care by specialist   . Subarachnoid hemorrhage ([facility]) 06/23/2020  . HYPERCHOLESTEROLEMIA 12/04/2008  . 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 SUPERFICIAL  PHLEBITIS 12/04/2008  . CHRONIC PROSTATITIS 12/04/2008  . PROSTATE SPECIFIC ANTIGEN, ELEVATED 12/04/2008    Olegario Messier, MS, OTR/L 09/12/2020, 1:33 PM  [place] [facility] MAIN [facility] SERVICES [address] [place], [place], [address] Phone: [phone]   Fax:  [phone]  Name: LORAN FLEET MRN: [id number] Date of Birth: [birth year]

## 2020-09-17 ENCOUNTER — Other Ambulatory Visit: Payer: Self-pay

## 2020-09-17 ENCOUNTER — Encounter: Payer: Self-pay | Admitting: Occupational Therapy

## 2020-09-17 ENCOUNTER — Ambulatory Visit: Payer: Medicare HMO

## 2020-09-17 ENCOUNTER — Ambulatory Visit: Payer: Medicare HMO | Admitting: Occupational Therapy

## 2020-09-17 DIAGNOSIS — M6281 Muscle weakness (generalized): Secondary | ICD-10-CM

## 2020-09-17 DIAGNOSIS — R278 Other lack of coordination: Secondary | ICD-10-CM

## 2020-09-17 DIAGNOSIS — R2681 Unsteadiness on feet: Secondary | ICD-10-CM

## 2020-09-17 NOTE — Therapy (Signed)
[facility] MAIN [facility] SERVICES [address] [place], [place], [address] Phone: [phone]   Fax:  [phone]  Occupational Therapy Treatment  Patient Details  Name: Edward Campbell MRN: [id number] Date of Birth: 1941-12-03 Referring Provider (OT): Dr.  Rolin Barry   Encounter Date: 09/17/2020   OT End of Session - 09/17/20 1022    Visit Number 14    Number of Visits 24    Date for OT Re-Evaluation 12/05/20    Authorization Time Period Progress report period starting 06/20/2020    OT Start Time 1017    OT Stop Time 1100    OT Time Calculation (min) 43 min    Activity Tolerance Patient tolerated treatment well    Behavior During Therapy [facility] for tasks assessed/performed           Past Medical History:  Diagnosis Date  . Stroke [facility])     Past Surgical History:  Procedure Laterality Date  . BACK SURGERY      There were no vitals filed for this visit.   Subjective Assessment - 09/17/20 1022    Subjective  Pt. has had a cardiac monitor placed in his chest last week.    Pertinent History Pt is an 79 y/o M with PMH: CVA with acute L sided weakness, numbness and dysarthria on 4/11 and 4/28 requiring hospitalization. Other pertinent medical history: includes HTN, HLD, and DVT.    Special Tests Vision: scanning in tact, peripheral vision in tact in all 4 quadrants, appropriate tracking and convergence.    Currently in Pain? No/denies          OT TREATMENT    Neuro muscular re-education:  Pt. worked on [facility] skills using the [employer] Task. Pt. worked on sustaining grasp on the resistive tweezers while grasping this sticks, and moving them from a horizontal position to a vertical position to prepare for placing them into the pegboard. Pt. required verbal cues, and cues for visual demonstration for wrist position, and hand pattern when placing them into the pegboard. The task was modified to increase the level of difficulty  with the board placed at a vertical angle, and pegs placed in a magnetic dish.  Pt. is improving, however continues to present with limited coordination. Pt. was able to manipulate the resistive tweezers while grasping the thin pegs form a horizontal position while moving them to a vertical position in preparation for placing them into a pegboard. Pt. had more more difficulty, and required increased time to complete the task when modifications were applied. Pt. Required verbal cues, and visual demonstration for hand placement on the tweezers when removing the pegs. Pt. continues to work on improving LUE functioning in order to improve, and maximize overall independence with ADLs, and IADL tasks.                          OT Education - 09/17/20 1022    Education Details FMC, hand function    Person(s) Educated Patient    Methods Explanation;Demonstration    Comprehension Verbalized understanding;Returned demonstration            OT Short Term Goals - 09/13/20 0858      OT SHORT TERM GOAL #1   Title Pt will be compliant with HEP with green theraputty to increase fine motor control and independence.    Baseline Pt is independent with Theraputty HEP.    Time 4  Period Weeks    Status Achieved    Target Date 07/18/20             OT Long Term Goals - 09/12/20 1321      OT LONG TERM GOAL #1   Title Pt will increase FOTO goal from 68 to 71 to improve performance of HH IADL tasks and overall QOL.    Baseline 7/14, FOTO score 68, 10th visit:  72    Time 12    Period Weeks    Status On-going    Target Date 12/05/20      OT LONG TERM GOAL #2   Title Pt. will improve left grip strength by 10# to be able to improve his ability to hold ADL objects  in his hand.    Baseline 10.05/2020: 60#    Time 12    Period Weeks    Status On-going    Target Date 12/05/20      OT LONG TERM GOAL #3   Title Pt. will improve LUE motor control in order to independently reach up  to place items on a shelf.    Baseline 12/05/2020: Pt. is now able to reach up to place multiple items at one time on a shelf with hid left hand.    Time 12    Period Weeks    Status On-going    Target Date 12/05/20      OT LONG TERM GOAL #4   Title Pt. will improve Left hand FMC skills by 3 sec. to be able to manipulate small objects independently for ADLs.    Baseline 7/14 L hand 39 seconds (R hand 27 sec.) 10th  visit 37 secs left 09/12/2020: Left 37 sec., Right 24 sec.    Time 12    Period Weeks    Status On-going    Target Date 12/05/20      OT LONG TERM GOAL #5   Title Pt. will improve left hand function to be able to independently, and efficiently separate multiple sheets of papers during bill management.    Baseline 09/12/2020:Pt. has difficulty performing.    Time 12    Period Weeks    Status New    Target Date 12/05/20      Long Term Additional Goals   Additional Long Term Goals Yes      OT LONG TERM GOAL #6   Title Pt. will independently type a one paragraph email correspondence accurately, and efficiently.    Baseline 09/12/2020: Pt has difficulty typing a one paragraph email correspondence efficiently.    Time 12    Period Weeks    Status New    Target Date 12/05/20                 Plan - 09/17/20 1023    Clinical Impression Statement Pt. is improving, however continues to present with limited coordination. Pt. was able to manipulate the resistive tweezers while grasping the thin pegs form a horizontal position while moving them to a vertical position in preparation for placing them into a pegboard. Pt. had more more difficulty, and required increased time to complete the task when modifications were applied. Pt. Required verbal cues, and visual demonstration for hand placement on the tweezers when removing the pegs. Pt. continues to work on improving LUE functioning in order to improve, and maximize overall independence with ADLs, and IADL tasks.   OT  Occupational Profile and History Detailed Assessment- Review of Records and additional review of physical,  cognitive, psychosocial history related to current functional performance    Occupational performance deficits (Please refer to evaluation for details): ADL's;IADL's    Body Structure / Function / Physical Skills ADL;Balance;FMC;GMC;Coordination;IADL;Strength;UE functional use    Rehab Potential Excellent    Clinical Decision Making Limited treatment options, no task modification necessary    Comorbidities Affecting Occupational Performance: May have comorbidities impacting occupational performance    Modification or Assistance to Complete Evaluation  Min-Moderate modification of tasks or assist with assess necessary to complete eval    OT Frequency 2x / week    OT Duration 12 weeks    OT Treatment/Interventions Self-care/ADL training;Therapeutic exercise;Patient/family education;DME and/or AE instruction;Therapeutic activities;Neuromuscular education    Plan Therapeutic activities to enhance FMC/control, self-care re-training to modify as needed, neuromuscular education to enhance L side awareness as able, patient education to enhance safety and carryover outside of therapy, Therapeutic exercise to improve strength as it pertains to safe and efficient performance of ADLs.    OT Home Exercise Plan Use green putty to increase digit flex/ext strength, stretching wrist/digits and rest breaks between exercise to prevent cramping           Patient will benefit from skilled therapeutic intervention in order to improve the following deficits and impairments:   Body Structure / Function / Physical Skills: ADL, Balance, FMC, GMC, Coordination, IADL, Strength, UE functional use       Visit Diagnosis: Muscle weakness (generalized)  Other lack of coordination    Problem List Patient Active Problem List   Diagnosis Date Noted  . Malnutrition of moderate degree 06/25/2020  . Fall   .  Palliative care by specialist   . Subarachnoid hemorrhage (HCC) 06/23/2020  . HYPERCHOLESTEROLEMIA 12/04/2008  . 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 SUPERFICIAL PHLEBITIS 12/04/2008  . CHRONIC PROSTATITIS 12/04/2008  . PROSTATE SPECIFIC ANTIGEN, ELEVATED 12/04/2008    Olegario Messier, MS, OTR/L 09/17/2020, 10:28 AM  [place] [facility] MAIN [facility] SERVICES [address] [place], [place], [address] Phone: [phone]   Fax:  [phone]  Name: Edward Campbell MRN: [id number] Date of Birth: August 30, 1941

## 2020-09-17 NOTE — Therapy (Signed)
[place] MAIN [facility] SERVICES [address] [place], [place], [address] Phone: [phone]   Fax:  [phone]  Physical Therapy Treatment  Patient Details  Name: AEMON KOELLER MRN: [id number] Date of Birth: 11-12-1941 Referring Provider (PT): Willette Cluster   Encounter Date: 09/17/2020   PT End of Session - 09/17/20 0937    Visit Number 14    Number of Visits 33    Date for PT Re-Evaluation 10/18/20    Authorization Type initial eval: 6/10; re-eval: 06/27/20; PN: 08/30/20    PT Start Time 0930    PT Stop Time 1015    PT Time Calculation (min) 45 min    Equipment Utilized During Treatment Gait belt    Activity Tolerance Patient tolerated treatment well;No increased pain    Behavior During Therapy WFL for tasks assessed/performed           Past Medical History:  Diagnosis Date   Stroke [facility])     Past Surgical History:  Procedure Laterality Date   BACK SURGERY      There were no vitals filed for this visit.   Subjective Assessment - 09/17/20 0929    Subjective Pt doing well, no pain today. No falls or stumbles since last therapy session. No specific questions or concerns currently.    Pertinent History Patient returning to PT after hospitalization 7/17-7/19. Was walking in the heat and fainted, bruising throughout body on L side.  Hasn't gone walking since returned home from hospitalization due to weakness. hx of prior R MCA stroke, HTN, HLD, and DVT. He had two ischemic strokes in <3 weeks (4/11 and 4/28)  with acute left arm weakness, numbness and dysarthria. Imaging on 4/12 positive for PFO with an WF > 55%, mild LVH and no thrombus. He also has an atrial septal aneurysm PMh includes  He had a driving safety test on 06/04/20 but was not cleared to return to driving yet. Has meals on wheels to help with lunch. Apartment complex has a gym    Currently in Pain? No/denies             TREATMENT   Ther-ex Octane xRide  L3-4 for warm-up during history x 5 minutes (4 minutes unbilled): Precor BLEleg press70#2 x 22, ; Sit to stand without UE support from regular height chair Airex pad under feet and 3kg overhead medball press 2 x 10;  Seated green tband resisted L ankle dorsiflexion 2 x 20;   Neuromuscular Re-education All balance exercises performed without UE support: Airex 6 inch step taps alternating LE x 10 each; Airex 6 inch step taps with Airex pad on top of step alternating LE x 10 each; Airex 6 inch step-ups with Airex pad on top alternating leading LE x 10 each; 1/2 foam roll (flat side up) tandem balance without UE support alternating forward LE x 30s each; 1/2 foam roll (flat side up) A/P balance without UE support x 30s; 1/2 foam roll (flat side up) A/P heel/toe rocking without UE support x 30s; Step training on Biodex treadmill with biofeedback for step length as well as cues from therapist for large steps, therapist adjusting speed and step length 0.4 cycles/sec x 4 minutes;   Pt educated throughout session about proper posture and technique with exercises. Improved exercise technique, movement at target joints, use of target muscles after min to mod verbal, visual, tactile cues.   Pt demonstrates excellent motivation during session today. Performed both balance and strengtheningexercises with patient today  but more focus devoted to balance training. Pt continues to require intermittent seated rest breaks. Incorporated biofeedback training today on treadmill to encourage larger steps during ambulation and improve energy conservation. He has not achieved maximal benefit from PT at this time and hewould continue tobenefit from PT services to address deficits in strength, balance, and mobility in order to return to full function at home.                            PT Short Term Goals - 08/30/20 1153      PT SHORT TERM GOAL #1   Title Patient will be  independent in home exercise program to improve strength/mobility for better functional independence with ADLs    Baseline 7/1 HEP given    Time 2    Period Weeks    Status On-going    Target Date 07/11/20             PT Long Term Goals - 08/30/20 1154      PT LONG TERM GOAL #1   Title Patient will increase FOTO score to equal to or greater than 71 %   to demonstrate statistically significant improvement in mobility and quality of life.    Baseline 7/1: 70% 7/86: RECERT: 75.4%; 4/92/01: 66    Time 8    Period Weeks    Status Partially Met    Target Date 10/18/20      PT LONG TERM GOAL #2   Title Patient will demonstrate an improved Berg Balance Score of >47/56 as to demonstrate improved balance with ADLs such as sitting/standing and transfer balance and reduced fall risk.    Baseline 7/1: 00/71 2/19: RECERT 75/88; 03/01/48: 53/56    Time 8    Period Weeks    Status Achieved      PT LONG TERM GOAL #3   Title Patient will increase six minute walk test distance to >1500 for progression to community ambulator age norm and improve gait ability    Baseline 7/1: 1159 ft with excessive foot drag; 8/26: RECERT 415 ft; 08/06/93: 1340'    Time 8    Period Weeks    Status Partially Met    Target Date 10/18/20      PT LONG TERM GOAL #4   Title Patient will increase BLE gross strength to 4+/5 as to improve functional strength for independent gait, increased standing tolerance and increased ADL ability.    Baseline 7/1: see chart 0/76: RECERT see note; 07/15/80: see note;    Time 8    Period Weeks    Status On-going    Target Date 10/18/20      PT LONG TERM GOAL #5   Title Patient will safely negotiate obstacles in a crowded room without running into object on his left, with good foot clearance, and arm swing    Baseline 7/1: unable to 1/03: recert: unable; 1/59/45: Not bumping into objects, still with decreased L arm swing as well as decreased L foot clearance    Time 8    Period  Weeks    Status Partially Met    Target Date 10/18/20                 Plan - 09/17/20 8592    Clinical Impression Statement Pt demonstrates excellent motivation during session today. Performed both balance and strengthening exercises with patient today but more focus devoted to balance training. Pt continues to require  intermittent seated rest breaks. Incorporated biofeedback training today on treadmill to encourage larger steps during ambulation and improve energy conservation. He has not achieved maximal benefit from PT at this time and he would continue to benefit from PT services to address deficits in strength, balance, and mobility in order to return to full function at home.    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    PT Frequency 2x / week    PT Duration 8 weeks    PT Treatment/Interventions ADLs/Self Care Home Management;Aquatic Therapy;Biofeedback;Canalith Repostioning;Cryotherapy;Advice worker;Iontophoresis 41m/ml Dexamethasone;Moist Heat;Ultrasound;Therapeutic exercise;Therapeutic activities;Functional mobility training;Stair training;DME Instruction;Balance training;Neuromuscular re-education;Patient/family education;Orthotic Fit/Training;Manual techniques;Passive range of motion;Dry needling;Energy conservation;Splinting;Taping;Vestibular;Vasopneumatic Device;Manual lymph drainage    PT Next Visit Plan ankle stability exercises,  spatial awareness for L side, dual task.    PT Home Exercise Plan see above    Consulted and Agree with Plan of Care Patient           Patient will benefit from skilled therapeutic intervention in order to improve the following deficits and impairments:  Abnormal gait, Decreased activity tolerance, Decreased balance, Decreased endurance, Decreased coordination, Decreased cognition, Decreased knowledge of use of DME, Decreased mobility, Decreased safety awareness, Difficulty walking, Decreased strength, Impaired flexibility, Impaired perceived functional ability, Impaired UE functional use, Impaired vision/preception, Postural dysfunction, Improper body mechanics  Visit Diagnosis: Muscle weakness (generalized)  Unsteadiness on feet     Problem List Patient Active Problem List   Diagnosis Date Noted   Malnutrition of moderate degree 06/25/2020   Fall    Palliative care by specialist    Subarachnoid hemorrhage ([facility]andfall 06/23/2020   HYPERCHOLESTEROLEMIA 12/04/2008   RESTLESS LEGS SYNDROME 12/04/2008   RAYNAUDS SYNDROME 12/04/2008   SUPERFICIAL PHLEBITIS 12/04/2008   CHRONIC PROSTATITIS 12/04/2008   PROSTATE SPECIFIC ANTIGEN, ELEVATED 12/04/2008   JLyndel SafeHuprich PT, DPT, GCS  Ady Heimann 09/17/2020, 10:20 AM  [facility] [address] [address]Phone: [phone]  Fax:  [phone] Name: JFAIZAAN FALLSMRN: [id number] of Birth: [birth year]

## 2020-09-19 ENCOUNTER — Other Ambulatory Visit: Payer: Self-pay

## 2020-09-19 ENCOUNTER — Ambulatory Visit: Payer: Medicare HMO | Admitting: Occupational Therapy

## 2020-09-19 ENCOUNTER — Encounter: Payer: Self-pay | Admitting: Occupational Therapy

## 2020-09-19 ENCOUNTER — Ambulatory Visit: Payer: Medicare HMO

## 2020-09-19 DIAGNOSIS — R278 Other lack of coordination: Secondary | ICD-10-CM

## 2020-09-19 DIAGNOSIS — R2681 Unsteadiness on feet: Secondary | ICD-10-CM

## 2020-09-19 DIAGNOSIS — M6281 Muscle weakness (generalized): Secondary | ICD-10-CM | POA: Diagnosis not present

## 2020-09-19 NOTE — Therapy (Signed)
[place] MAIN [facility] SERVICES [address] [place], [place], [address] Phone: [phone]   Fax:  [phone]  Physical Therapy Treatment  Patient Details  Name: Edward Campbell MRN: [id number] Date of Birth: 26-Apr-1941 Referring Provider (PT): Willette Cluster   Encounter Date: 09/19/2020   PT End of Session - 09/19/20 1303    Visit Number 15    Number of Visits 33    Date for PT Re-Evaluation 10/18/20    Authorization Type initial eval: 6/10; re-eval: 06/27/20; PN: 08/30/20    PT Start Time 1345    PT Stop Time 1430    PT Time Calculation (min) 45 min    Equipment Utilized During Treatment Gait belt    Activity Tolerance Patient tolerated treatment well;No increased pain    Behavior During Therapy WFL for tasks assessed/performed           Past Medical History:  Diagnosis Date  . Stroke [facility])     Past Surgical History:  Procedure Laterality Date  . BACK SURGERY      There were no vitals filed for this visit.   Subjective Assessment - 09/19/20 1303    Subjective Pt doing well, no pain today. No falls or stumbles since last therapy session. No specific questions or concerns currently.    Pertinent History Patient returning to PT after hospitalization 7/17-7/19. Was walking in the heat and fainted, bruising throughout body on L side.  Hasn't gone walking since returned home from hospitalization due to weakness. hx of prior R MCA stroke, HTN, HLD, and DVT. He had two ischemic strokes in <3 weeks (4/11 and 4/28)  with acute left arm weakness, numbness and dysarthria. Imaging on 4/12 positive for PFO with an WF > 55%, mild LVH and no thrombus. He also has an atrial septal aneurysm PMh includes  He had a driving safety test on 06/04/20 but was not cleared to return to driving yet. Has meals on wheels to help with lunch. Apartment complex has a gym    Currently in Pain? No/denies              TREATMENT   Ther-ex NuStep L3-4  for warm-up during history x 5 minutes (4 minutes unbilled): Precor BLEleg press75# x 22, 85# x 11; Matrix resisted gait forward, backward, R lateral, and L lateral 7.5# x 2 each direction; Sit to stand without UE support from regular height chair Airex pad under feet and 5kg overhead medball press 2 x 10;   Neuromuscular Re-education All balance exercises performed without UE support: 6" orange hurdle forward/backward step overs x 10; 6" orange hurdle lateral step-overs x 10 each direction; Rockerboard A/P and R/L orientation static balance x 60s each; Rockerboard A/P heel/toe rocks x 30s; Rockerboard A/P balance with horizontal and vertical head turns x 30s each; Rockerboard R/L weight shifts x 30s; Rockerboard R/L balance with horizontal and vertical head turns x 30s each;   Pt educated throughout session about proper posture and technique with exercises. Improved exercise technique, movement at target joints, use of target muscles after min to mod verbal, visual, tactile cues.   Pt demonstrates excellent motivation during session today. Performed both balance and strengtheningexercises with patient today patient is able to progress resistance on the leg press today.  Also performed resisted gait with patient which he finds challenging.  Utilized unstable rocker board to challenge balance and also challenged single-leg stance with hurdle step overs both forward and lateral.. Pt continues to require  intermittent seated rest breaks due to fatigue. He has not achieved maximal benefit from PT at this time and hewould continue tobenefit from PT services to address deficits in strength, balance, and mobility in order to return to full function at home.                                 PT Short Term Goals - 08/30/20 1153      PT SHORT TERM GOAL #1   Title Patient will be independent in home exercise program to improve strength/mobility for better  functional independence with ADLs    Baseline 7/1 HEP given    Time 2    Period Weeks    Status On-going    Target Date 07/11/20             PT Long Term Goals - 08/30/20 1154      PT LONG TERM GOAL #1   Title Patient will increase FOTO score to equal to or greater than 71 %   to demonstrate statistically significant improvement in mobility and quality of life.    Baseline 7/1: 34% 2/87: RECERT: 68.1%; 1/57/26: 66    Time 8    Period Weeks    Status Partially Met    Target Date 10/18/20      PT LONG TERM GOAL #2   Title Patient will demonstrate an improved Berg Balance Score of >47/56 as to demonstrate improved balance with ADLs such as sitting/standing and transfer balance and reduced fall risk.    Baseline 7/1: 20/35 5/97: RECERT 41/63; 8/45/36: 53/56    Time 8    Period Weeks    Status Achieved      PT LONG TERM GOAL #3   Title Patient will increase six minute walk test distance to >1500 for progression to community ambulator age norm and improve gait ability    Baseline 7/1: 1159 ft with excessive foot drag; 4/68: RECERT 032 ft; 12/29/46: 1340'    Time 8    Period Weeks    Status Partially Met    Target Date 10/18/20      PT LONG TERM GOAL #4   Title Patient will increase BLE gross strength to 4+/5 as to improve functional strength for independent gait, increased standing tolerance and increased ADL ability.    Baseline 7/1: see chart 2/50: RECERT see note; 0/37/04: see note;    Time 8    Period Weeks    Status On-going    Target Date 10/18/20      PT LONG TERM GOAL #5   Title Patient will safely negotiate obstacles in a crowded room without running into object on his left, with good foot clearance, and arm swing    Baseline 7/1: unable to 8/88: recert: unable; 08/23/93: Not bumping into objects, still with decreased L arm swing as well as decreased L foot clearance    Time 8    Period Weeks    Status Partially Met    Target Date 10/18/20                  Plan - 09/19/20 1304    Clinical Impression Statement Pt demonstrates excellent motivation during session today. Performed both balance and strengthening exercises with patient today patient is able to progress resistance on the leg press today.  Also performed resisted gait with patient which he finds challenging.  Utilized unstable rocker board to challenge balance  and also challenged single-leg stance with hurdle step overs both forward and lateral.. Pt continues to require intermittent seated rest breaks due to fatigue. He has not achieved maximal benefit from PT at this time and he would continue to benefit from PT services to address deficits in strength, balance, and mobility in order to return to full function at home.    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    PT Frequency 2x / week    PT Duration 8 weeks    PT Treatment/Interventions ADLs/Self Care Home Management;Aquatic Therapy;Biofeedback;Canalith Repostioning;Cryotherapy;Advice worker;Iontophoresis 22m/ml Dexamethasone;Moist Heat;Ultrasound;Therapeutic exercise;Therapeutic activities;Functional mobility training;Stair training;DME Instruction;Balance training;Neuromuscular re-education;Patient/family education;Orthotic Fit/Training;Manual techniques;Passive range of motion;Dry needling;Energy conservation;Splinting;Taping;Vestibular;Vasopneumatic Device;Manual lymph drainage     PT Next Visit Plan ankle stability exercises, spatial awareness for L side, dual task.    PT Home Exercise Plan see above    Consulted and Agree with Plan of Care Patient           Patient will benefit from skilled therapeutic intervention in order to improve the following deficits and impairments:  Abnormal gait, Decreased activity tolerance, Decreased balance, Decreased endurance, Decreased coordination, Decreased cognition, Decreased knowledge of use of DME, Decreased mobility, Decreased safety awareness, Difficulty walking, Decreased strength, Impaired flexibility, Impaired perceived functional ability, Impaired UE functional use, Impaired vision/preception, Postural dysfunction, Improper body mechanics  Visit Diagnosis: Muscle weakness (generalized)  Other lack of coordination  Unsteadiness on feet     Problem List Patient Active Problem List   Diagnosis Date Noted  . Malnutrition of moderate degree 06/25/2020  . Fall   . Palliative care by specialist   . Sub[place]achnoid hemorrhage ([place] 06/23/2020  . HYPERCHOLESTEROLEMIA 12/04/2008  . 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 SUPERFICIAL PHLEBITIS 12/04/2008  . CHRONIC PROSTATITIS 12/04/2008  . PROSTATE SPECIFIC ANTIGEN, ELEVATED 12/04/2008   JPhillips GroutPT, DPT, GCS  Huprich,Jason 09/19/2020, 3:51 PM  [place] [facility]The [facility]ERVICES [address]RAlder [place] [address]Phone: [phone]  Fax:  [phone] Name: JJALEEN FINCHMRN: [id number] of Birth: 2October 15, 1942

## 2020-09-19 NOTE — Therapy (Signed)
[place] [facility] MAIN [facility] SERVICES [address] [place], [place], [address] Phone: [phone]   Fax:  [phone]  Occupational Therapy Treatment  Patient Details  Name: Edward Campbell MRN: [id number] Date of Birth: Jan 24, 1941 Referring Provider (OT): Dr.  Rolin Barry   Encounter Date: 09/19/2020   OT End of Session - 09/19/20 1309    Visit Number 15    Number of Visits 24    Date for OT Re-Evaluation 12/05/20    Authorization Type FOTO    Authorization Time Period Progress report period starting 06/20/2020    Activity Tolerance Patient tolerated treatment well    Behavior During Therapy [facility] for tasks assessed/performed           Past Medical History:  Diagnosis Date   Stroke [facility])     Past Surgical History:  Procedure Laterality Date   BACK SURGERY      There were no vitals filed for this visit.   Subjective Assessment - 09/19/20 1308    Subjective  Pt. reports that he feels as though time really just flies by day to day.    Pertinent History Pt is an 79 y/o M with PMH: CVA with acute L sided weakness, numbness and dysarthria on 4/11 and 4/28 requiring hospitalization. Other pertinent medical history: includes HTN, HLD, and DVT.    Currently in Pain? No/denies          OT TREATMENT    Neuro muscular re-education:  Pt. worked on left hand [facility] skills grasping, and manipulating various sized nuts, and bolts on a tabletop bolt board. Pt. worked with his left hand to screw, and unscrew the nuts, and bolts while his vision was occluded. Pt. used his right hand to stabilize the top of the bolt board. Pt. Had the most difficulty reconnecting the bolts.   Pt. was able to unscrew multiple nuts from the bolts with his vision occluded with verbal cues.  Pt. required verbal cues, and visual demonstration for hand placement when screwing, and unscrewing the nuts, and bolts with his left hand while stabilizing the bolts from the top with  his right hand. Pt. continues to work on improving LUE functioning in order to improve, and maximize overall independence with ADLs, and IADL tasks.                     OT Education - 09/19/20 1308    Education Details FMC, hand function    Person(s) Educated Patient    Methods Explanation;Demonstration    Comprehension Verbalized understanding;Returned demonstration            OT Short Term Goals - 09/13/20 0858      OT SHORT TERM GOAL #1   Title Pt will be compliant with HEP with green theraputty to increase fine motor control and independence.    Baseline Pt is independent with Theraputty HEP.    Time 4    Period Weeks    Status Achieved    Target Date 07/18/20             OT Long Term Goals - 09/12/20 1321      OT LONG TERM GOAL #1   Title Pt will increase FOTO goal from 68 to 71 to improve performance of HH IADL tasks and overall QOL.    Baseline 7/14, FOTO score 68, 10th visit:  72    Time 12    Period Weeks    Status On-going  Target Date 12/05/20      OT LONG TERM GOAL #2   Title Pt. will improve left grip strength by 10# to be able to improve his ability to hold ADL objects  in his hand.    Baseline 10.05/2020: 60#    Time 12    Period Weeks    Status On-going    Target Date 12/05/20      OT LONG TERM GOAL #3   Title Pt. will improve LUE motor control in order to independently reach up to place items on a shelf.    Baseline 12/05/2020: Pt. is now able to reach up to place multiple items at one time on a shelf with hid left hand.    Time 12    Period Weeks    Status On-going    Target Date 12/05/20      OT LONG TERM GOAL #4   Title Pt. will improve Left hand FMC skills by 3 sec. to be able to manipulate small objects independently for ADLs.    Baseline 7/14 L hand 39 seconds (R hand 27 sec.) 10th  visit 37 secs left 09/12/2020: Left 37 sec., Right 24 sec.    Time 12    Period Weeks    Status On-going    Target Date 12/05/20       OT LONG TERM GOAL #5   Title Pt. will improve left hand function to be able to independently, and efficiently separate multiple sheets of papers during bill management.    Baseline 09/12/2020:Pt. has difficulty performing.    Time 12    Period Weeks    Status New    Target Date 12/05/20      Long Term Additional Goals   Additional Long Term Goals Yes      OT LONG TERM GOAL #6   Title Pt. will independently type a one paragraph email correspondence accurately, and efficiently.    Baseline 09/12/2020: Pt has difficulty typing a one paragraph email correspondence efficiently.    Time 12    Period Weeks    Status New    Target Date 12/05/20                 Plan - 09/19/20 1309    Clinical Impression Statement Pt. was able to unscrew multiple nuts from the bolts with his vision occluded with verbal cues.  Pt. required verbal cues, and visual demonstration for hand placement when screwing, and unscrewing the nuts, and bolts with his left hand while stabilizing the bolts from the top with his right hand. Pt. continues to work on improving LUE functioning in order to improve, and maximize overall independence with ADLs, and IADL tasks.   OT Occupational Profile and History Detailed Assessment- Review of Records and additional review of physical, cognitive, psychosocial history related to current functional performance    Occupational performance deficits (Please refer to evaluation for details): ADL's;IADL's    Body Structure / Function / Physical Skills ADL;Balance;FMC;GMC;Coordination;IADL;Strength;UE functional use    Rehab Potential Excellent    Clinical Decision Making Limited treatment options, no task modification necessary    Comorbidities Affecting Occupational Performance: May have comorbidities impacting occupational performance    Modification or Assistance to Complete Evaluation  Min-Moderate modification of tasks or assist with assess necessary to complete eval    OT  Frequency 2x / week    OT Duration 12 weeks    OT Treatment/Interventions Self-care/ADL training;Therapeutic exercise;Patient/family education;DME and/or AE instruction;Therapeutic activities;Neuromuscular education  Plan Therapeutic activities to enhance FMC/control, self-care re-training to modify as needed, neuromuscular education to enhance L side awareness as able, patient education to enhance safety and carryover outside of therapy, Therapeutic exercise to improve strength as it pertains to safe and efficient performance of ADLs.    Consulted and Agree with Plan of Care Patient           Patient will benefit from skilled therapeutic intervention in order to improve the following deficits and impairments:   Body Structure / Function / Physical Skills: ADL, Balance, FMC, GMC, Coordination, IADL, Strength, UE functional use       Visit Diagnosis: No diagnosis found.    Problem List Patient Active Problem List   Diagnosis Date Noted   Malnutrition of moderate degree 06/25/2020   Fall    Palliative care by specialist    Subarachnoid hemorrhage (HCC) 06/23/2020   HYPERCHOLESTEROLEMIA 12/04/2008   RESTLESS LEGS SYNDROME 12/04/2008   RAYNAUDS SYNDROME 12/04/2008   SUPERFICIAL PHLEBITIS 12/04/2008   CHRONIC PROSTATITIS 12/04/2008   PROSTATE SPECIFIC ANTIGEN, ELEVATED 12/04/2008    Olegario Messier, MS, OTR/L 09/19/2020, 1:11 PM  [facility] MAIN [facility] SERVICES [address] [place], [place], [address] Phone: [phone]   Fax:  [phone]  Name: Edward Campbell MRN: [id number] Date of Birth: 12/11/1940

## 2020-09-24 ENCOUNTER — Other Ambulatory Visit: Payer: Self-pay

## 2020-09-24 ENCOUNTER — Ambulatory Visit: Payer: Medicare HMO

## 2020-09-24 DIAGNOSIS — R2681 Unsteadiness on feet: Secondary | ICD-10-CM

## 2020-09-24 DIAGNOSIS — M6281 Muscle weakness (generalized): Secondary | ICD-10-CM | POA: Diagnosis not present

## 2020-09-24 NOTE — Therapy (Signed)
[place] MAIN [facility] SERVICES [address] [place], [place], [address] Phone: [phone]   Fax:  [phone]  Physical Therapy Treatment  Patient Details  Name: Edward Campbell MRN: [id number] Date of Birth: March 29, 1941 Referring Provider (PT): Willette Cluster   Encounter Date: 09/24/2020   PT End of Session - 09/24/20 1304    Visit Number 16    Number of Visits 33    Date for PT Re-Evaluation 10/18/20    Authorization Type initial eval: 6/10; re-eval: 06/27/20; PN: 08/30/20    PT Start Time 1301    PT Stop Time 1345    PT Time Calculation (min) 44 min    Equipment Utilized During Treatment Gait belt    Activity Tolerance Patient tolerated treatment well;No increased pain    Behavior During Therapy WFL for tasks assessed/performed           Past Medical History:  Diagnosis Date   Stroke [facility])     Past Surgical History:  Procedure Laterality Date   BACK SURGERY      There were no vitals filed for this visit.   Subjective Assessment - 09/24/20 1300    Subjective Pt doing well, no pain today. No falls or stumbles since last therapy session. No specific questions or concerns currently.    Pertinent History Patient returning to PT after hospitalization 7/17-7/19. Was walking in the heat and fainted, bruising throughout body on L side.  Hasn't gone walking since returned home from hospitalization due to weakness. hx of prior R MCA stroke, HTN, HLD, and DVT. He had two ischemic strokes in <3 weeks (4/11 and 4/28)  with acute left arm weakness, numbness and dysarthria. Imaging on 4/12 positive for PFO with an WF > 55%, mild LVH and no thrombus. He also has an atrial septal aneurysm PMh includes  He had a driving safety test on 06/04/20 but was not cleared to return to driving yet. Has meals on wheels to help with lunch. Apartment complex has a gym    Currently in Pain? Yes    Pain Score 4     Pain Location Back    Pain Orientation  Lower;Other (Comment)   midline   Pain Descriptors / Indicators Aching    Pain Type Chronic pain    Pain Onset More than a month ago    Pain Frequency Intermittent             TREATMENT   Ther-ex NuStep L3 for warm-up during history x 5 minutes (4 minutes unbilled): Precor BLEleg press75# x 22, 90# x 22; Precor BLE heel raise 40# x 32, 55# x 32;  Standing exercises with 5# ankle weights and BUE support in // bars: Hip flexion marches x 20 BLE; Hip abduction x 20 BLE; Hip extension x 20 BLE; HS curls x 20 BLE;  Forward BOSU (round side up) lunges without UE support alternating forward LE x 10 each;   Neuromuscular Re-education All balance exercises performed without UE support: BOSU step-ups alternating leading LE x 10 each; 1/2 foam roll A/P balance x 60s; 1/2 foam roll A/P heel/toe rocks x 60s; 1/2 foam roll tandem stance balance alternating forward LE 2 x 30s each;   Pt educated throughout session about proper posture and technique with exercises. Improved exercise technique, movement at target joints, use of target muscles after min to mod verbal, visual, tactile cues.   Pt demonstrates excellent motivation during session today. Performed both balance and strengtheningexercises with patient  today and patient is able to progress resistance on the leg press significantly today. Performed standing strengthening exercises with ankle weights which is fatiguing for patient and multiple rest breaks provided. He continues to struggle with balance on unstable surfaces such as on 1/2 foam roller. He has not achieved maximal benefit from PT at this time and hewould continue tobenefit from PT services to address deficits in strength, balance, and mobility in order to return to full function at home.                             PT Short Term Goals - 08/30/20 1153      PT SHORT TERM GOAL #1   Title Patient will be independent in home  exercise program to improve strength/mobility for better functional independence with ADLs    Baseline 7/1 HEP given    Time 2    Period Weeks    Status On-going    Target Date 07/11/20             PT Long Term Goals - 08/30/20 1154      PT LONG TERM GOAL #1   Title Patient will increase FOTO score to equal to or greater than 71 %   to demonstrate statistically significant improvement in mobility and quality of life.    Baseline 7/1: 48% 1/85: RECERT: 63.1%; 4/97/02: 66    Time 8    Period Weeks    Status Partially Met    Target Date 10/18/20      PT LONG TERM GOAL #2   Title Patient will demonstrate an improved Berg Balance Score of >47/56 as to demonstrate improved balance with ADLs such as sitting/standing and transfer balance and reduced fall risk.    Baseline 7/1: 63/78 5/88: RECERT 50/27; 7/41/28: 53/56    Time 8    Period Weeks    Status Achieved      PT LONG TERM GOAL #3   Title Patient will increase six minute walk test distance to >1500 for progression to community ambulator age norm and improve gait ability    Baseline 7/1: 1159 ft with excessive foot drag; 7/86: RECERT 767 ft; 01/16/46: 1340'    Time 8    Period Weeks    Status Partially Met    Target Date 10/18/20      PT LONG TERM GOAL #4   Title Patient will increase BLE gross strength to 4+/5 as to improve functional strength for independent gait, increased standing tolerance and increased ADL ability.    Baseline 7/1: see chart 0/96: RECERT see note; 2/83/66: see note;    Time 8    Period Weeks    Status On-going    Target Date 10/18/20      PT LONG TERM GOAL #5   Title Patient will safely negotiate obstacles in a crowded room without running into object on his left, with good foot clearance, and arm swing    Baseline 7/1: unable to 2/94: recert: unable; 7/65/46: Not bumping into objects, still with decreased L arm swing as well as decreased L foot clearance    Time 8    Period Weeks    Status  Partially Met    Target Date 10/18/20                 Plan - 09/24/20 1307    Clinical Impression Statement Pt demonstrates excellent motivation during session today. Performed both balance and  strengthening exercises with patient today and patient is able to progress resistance on the leg press significantly today. Performed standing strengthening exercises with ankle weights which is fatiguing for patient and multiple rest breaks provided. He continues to struggle with balance on unstable surfaces such as on 1/2 foam roller. He has not achieved maximal benefit from PT at this time and he would continue to benefit from PT services to address deficits in strength, balance, and mobility in order to return to full function at home.    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    PT Frequency 2x / week    PT Duration 8 weeks    PT Treatment/Interventions ADLs/Self Care Home Management;Aquatic Therapy;Biofeedback;Canalith Repostioning;Cryotherapy;Advice worker;Iontophoresis 66m/ml Dexamethasone;Moist Heat;Ultrasound;Therapeutic exercise;Therapeutic activities;Functional mobility training;Stair training;DME Instruction;Balance training;Neuromuscular re-education;Patient/family education;Orthotic Fit/Training;Manual techniques;Passive range of motion;Dry needling;Energy conservation;Splinting;Taping;Vestibular;Vasopneumatic Device;Manual lymph drainage     PT Next Visit Plan ankle stability exercises, spatial awareness for L side, dual task.    PT Home Exercise Plan see above    Consulted and Agree with Plan of Care Patient           Patient will benefit from skilled therapeutic intervention in order to improve the following deficits and impairments:  Abnormal gait, Decreased activity tolerance, Decreased balance, Decreased endurance, Decreased coordination, Decreased cognition, Decreased knowledge of use of DME, Decreased mobility, Decreased safety awareness, Difficulty walking, Decreased strength, Impaired flexibility, Impaired perceived functional ability, Impaired UE functional use, Impaired vision/preception, Postural dysfunction, Improper body mechanics  Visit Diagnosis: Muscle weakness (generalized)  Unsteadiness on feet     Problem List Patient Active Problem List   Diagnosis Date Noted   Malnutrition of moderate degree 06/25/2020   Fall    Palliative care by specialist    Sub[place]achnoid hemorrhage ([place] 06/23/2020   HYPERCHOLESTEROLEMIA 12/04/2008   RESTLESS LEGS SYNDROME 12/04/2008   RAYNAUDS SYNDROME 12/04/2008   SUPERFICIAL PHLEBITIS 12/04/2008   CHRONIC PROSTATITIS 12/04/2008   PROSTATE SPECIFIC ANTIGEN, ELEVATED 12/04/2008   JPhillips GroutPT, DPT, GCS  Ahliyah Nienow 09/24/2020, 1:51 PM  [place] [facility] [address] [address]Phone: [phone]  Fax:  [phone] Name: Edward NOCERAMRN: [id number] of Birth: [birth year]

## 2020-09-26 ENCOUNTER — Ambulatory Visit: Payer: Medicare HMO

## 2020-09-26 ENCOUNTER — Encounter: Payer: Self-pay | Admitting: Occupational Therapy

## 2020-09-26 ENCOUNTER — Ambulatory Visit: Payer: Medicare HMO | Admitting: Occupational Therapy

## 2020-09-26 ENCOUNTER — Other Ambulatory Visit: Payer: Self-pay

## 2020-09-26 DIAGNOSIS — M6281 Muscle weakness (generalized): Secondary | ICD-10-CM

## 2020-09-26 DIAGNOSIS — R278 Other lack of coordination: Secondary | ICD-10-CM

## 2020-09-26 DIAGNOSIS — R2681 Unsteadiness on feet: Secondary | ICD-10-CM

## 2020-09-26 NOTE — Therapy (Signed)
[facility] MAIN [facility] SERVICES [address] [place], [place], [address] Phone: [phone]   Fax:  [phone]  Occupational Therapy Treatment  Patient Details  Name: Edward Campbell MRN: [id number] Date of Birth: 04/30/1941 Referring Provider (OT): Dr.  Rolin Barry   Encounter Date: 09/26/2020   OT End of Session - 09/26/20 1454    Visit Number 16    Number of Visits 24    Date for OT Re-Evaluation 12/05/20    Authorization Type FOTO    Authorization Time Period Progress report period starting 06/20/2020    OT Start Time 1313    OT Stop Time 1351    OT Time Calculation (min) 38 min    Activity Tolerance Patient tolerated treatment well    Behavior During Therapy [facility] for tasks assessed/performed           Past Medical History:  Diagnosis Date   Stroke [facility])     Past Surgical History:  Procedure Laterality Date   BACK SURGERY      There were no vitals filed for this visit.   Subjective Assessment - 09/26/20 1448    Subjective  Pt reports he is having some slight lower back pain at home, but reports this is just related to running out of a medication that he is on chronically that he needs to get re-filled. Does not complain of pain currently/throughout session. Otherwise he reports doing well and feeling that he is getting better.    Pertinent History Pt is an 79 y/o M with PMH: CVA with acute L sided weakness, numbness and dysarthria on 4/11 and 4/28 requiring hospitalization. Other pertinent medical history: includes HTN, HLD, and DVT.    Limitations LUE strength, motor control, and [facility] skills.    Special Tests Vision: scanning in tact, peripheral vision in tact in all 4 quadrants, appropriate tracking and convergence.    Currently in Pain? No/denies          OT treatment:   Neuro Re-Ed OT engages pt in [facility] tasks to increase L hand coordination as it pertains to completion of I/ADLs. Pt is 100% successful with threading  manipulatives over a dowel and demos increased efficiency. Some increased time and MIN cues required, but pt overall with improved efficacy and success. OT then engages pt in bi-manual coordination tasks with ~8" diameter ball to improve tandem use of UEs. OT engages pt in two-hand bounce pass, two-hand chest pass, two-hand dribble, single hand dribble, and one-hand pass with ~3" diameter ball with his L hand (cues to continue to use L hand as he will switch to right for increased ease). Overall, pt with ~85% success with two-hand tasks, ~70% success with one-hand tasks with his L hand. For remainder of time, OT engages pt in typing trials to increase efficiency and accuracy as this is an important fine motor occupation to patient. Pt demos some increased success averaging ~7-8 words per minute with MIN verbal cues from OT for awareness of hand placement (ex: frequently accidentally hits "caps lock" instead of "a"). Pt tolerates all aspects of neuro re-ed portion of treatment well.    Therapeutic Exercise OT engages pt in B UE exercise with 4# dowel to address both strength of UEs (primarily to address decreased strength in L UE) as well as address bi-manual coordination. OT engages pt in 1 set x15 reps FWD row, 1 set x15 reps straight arm raises followed by 15 second hold, 1 set x20 reps bicep  curls, and 1 set x15 reps upward punches. Pt requires MIN verbal cues for form/pace/awareness of L UE positioning relative to R to engage both sides simultaneously. Pt with good reception of cues. Pt requires ~20 second rest between each exercise and tolerates well with no c/o pain.                    OT Education - 09/26/20 1454    Education Details FMC, hand function, bi-manual coordination    Person(s) Educated Patient    Methods Explanation;Demonstration    Comprehension Verbalized understanding;Returned demonstration            OT Short Term Goals - 09/13/20 0858      OT SHORT TERM GOAL  #1   Title Pt will be compliant with HEP with green theraputty to increase fine motor control and independence.    Baseline Pt is independent with Theraputty HEP.    Time 4    Period Weeks    Status Achieved    Target Date 07/18/20             OT Long Term Goals - 09/12/20 1321      OT LONG TERM GOAL #1   Title Pt will increase FOTO goal from 68 to 71 to improve performance of HH IADL tasks and overall QOL.    Baseline 7/14, FOTO score 68, 10th visit:  72    Time 12    Period Weeks    Status On-going    Target Date 12/05/20      OT LONG TERM GOAL #2   Title Pt. will improve left grip strength by 10# to be able to improve his ability to hold ADL objects  in his hand.    Baseline 10.05/2020: 60#    Time 12    Period Weeks    Status On-going    Target Date 12/05/20      OT LONG TERM GOAL #3   Title Pt. will improve LUE motor control in order to independently reach up to place items on a shelf.    Baseline 12/05/2020: Pt. is now able to reach up to place multiple items at one time on a shelf with hid left hand.    Time 12    Period Weeks    Status On-going    Target Date 12/05/20      OT LONG TERM GOAL #4   Title Pt. will improve Left hand FMC skills by 3 sec. to be able to manipulate small objects independently for ADLs.    Baseline 7/14 L hand 39 seconds (R hand 27 sec.) 10th  visit 37 secs left 09/12/2020: Left 37 sec., Right 24 sec.    Time 12    Period Weeks    Status On-going    Target Date 12/05/20      OT LONG TERM GOAL #5   Title Pt. will improve left hand function to be able to independently, and efficiently separate multiple sheets of papers during bill management.    Baseline 09/12/2020:Pt. has difficulty performing.    Time 12    Period Weeks    Status New    Target Date 12/05/20      Long Term Additional Goals   Additional Long Term Goals Yes      OT LONG TERM GOAL #6   Title Pt. will independently type a one paragraph email correspondence  accurately, and efficiently.    Baseline 09/12/2020: Pt has difficulty typing a one  paragraph email correspondence efficiently.    Time 12    Period Weeks    Status New    Target Date 12/05/20                 Plan - 09/26/20 1513    Clinical Impression Statement Pt presents in good spirits. OT engages pt in seated [facility] tasks to start to manage small manipulatives and thread over a dowel. Pt is 100% successful with this task and demos increased efficiency. OT then engages pt in bi-manual coordinaton tasks with ball including passing and dribbling. Pt is ~85% successful with these tasks with only ~15% drops. OT engages pt in UB exercise with 4# dowel which he tolerates well with ~20 seconds rest between each. Session ended with typing trials/training with only MIN verbal cues for hand placement awareness. Pt continues to progress skills toward higher level of INDEP with self care ADLs/IADLs. Will continue to follow per OT POC.    OT Occupational Profile and History Detailed Assessment- Review of Records and additional review of physical, cognitive, psychosocial history related to current functional performance    Occupational performance deficits (Please refer to evaluation for details): ADL's;IADL's    Body Structure / Function / Physical Skills ADL;Balance;FMC;GMC;Coordination;IADL;Strength;UE functional use    Rehab Potential Excellent    Clinical Decision Making Limited treatment options, no task modification necessary    Comorbidities Affecting Occupational Performance: May have comorbidities impacting occupational performance    Modification or Assistance to Complete Evaluation  Min-Moderate modification of tasks or assist with assess necessary to complete eval    OT Frequency 2x / week    OT Duration 12 weeks    OT Treatment/Interventions Self-care/ADL training;Therapeutic exercise;Patient/family education;DME and/or AE instruction;Therapeutic activities;Neuromuscular education     Plan Therapeutic activities to enhance FMC/control, self-care re-training to modify as needed, neuromuscular education to enhance L side awareness as able, patient education to enhance safety and carryover outside of therapy, Therapeutic exercise to improve strength as it pertains to safe and efficient performance of ADLs.    OT Home Exercise Plan Use green putty to increase digit flex/ext strength, stretching wrist/digits and rest breaks between exercise to prevent cramping    Consulted and Agree with Plan of Care Patient           Patient will benefit from skilled therapeutic intervention in order to improve the following deficits and impairments:   Body Structure / Function / Physical Skills: ADL, Balance, FMC, GMC, Coordination, IADL, Strength, UE functional use       Visit Diagnosis: Muscle weakness (generalized)  Other lack of coordination    Problem List Patient Active Problem List   Diagnosis Date Noted   Malnutrition of moderate degree 06/25/2020   Fall    Palliative care by specialist    Subarachnoid hemorrhage (HCC) 06/23/2020   HYPERCHOLESTEROLEMIA 12/04/2008   RESTLESS LEGS SYNDROME 12/04/2008   RAYNAUDS SYNDROME 12/04/2008   SUPERFICIAL PHLEBITIS 12/04/2008   CHRONIC PROSTATITIS 12/04/2008   PROSTATE SPECIFIC ANTIGEN, ELEVATED 12/04/2008    Darl Householder Bee Marchiano 09/26/2020, 3:22 PM  [place] [facility] MAIN [facility] SERVICES [address] [place], [place], [address] Phone: [phone]   Fax:  [phone]  Name: Edward Campbell MRN: [id number] Date of Birth: Apr 06, 1941

## 2020-09-26 NOTE — Therapy (Signed)
[place] MAIN [facility] SERVICES [address] [place], [place], [address] Phone: [phone]   Fax:  [phone]  Physical Therapy Treatment  Patient Details  Name: Edward Campbell MRN: [id number] Date of Birth: 1941/05/05 Referring Provider (PT): Willette Cluster   Encounter Date: 09/26/2020   PT End of Session - 09/26/20 1351    Visit Number 17    Number of Visits 33    Date for PT Re-Evaluation 10/18/20    Authorization Type initial eval: 6/10; re-eval: 06/27/20; PN: 08/30/20    PT Start Time 9528    PT Stop Time 1435    PT Time Calculation (min) 42 min    Equipment Utilized During Treatment Gait belt    Activity Tolerance Patient tolerated treatment well;No increased pain    Behavior During Therapy WFL for tasks assessed/performed           Past Medical History:  Diagnosis Date  . Stroke [facility])     Past Surgical History:  Procedure Laterality Date  . BACK SURGERY      There were no vitals filed for this visit.   Subjective Assessment - 09/26/20 1350    Subjective Pt doing well, no pain today. No falls or stumbles since last therapy session. No specific questions or concerns currently.    Pertinent History Patient returning to PT after hospitalization 7/17-7/19. Was walking in the heat and fainted, bruising throughout body on L side.  Hasn't gone walking since returned home from hospitalization due to weakness. hx of prior R MCA stroke, HTN, HLD, and DVT. He had two ischemic strokes in <3 weeks (4/11 and 4/28)  with acute left arm weakness, numbness and dysarthria. Imaging on 4/12 positive for PFO with an WF > 55%, mild LVH and no thrombus. He also has an atrial septal aneurysm PMh includes  He had a driving safety test on 06/04/20 but was not cleared to return to driving yet. Has meals on wheels to help with lunch. Apartment complex has a gym    Currently in Pain? No/denies             TREATMENT   Ther-ex Octane L3 for  warm-up during history x 5 minutes (4 minutes unbilled): Precor BLEleg press90# x 22, 100# x 22; Precor BLE heel raise 55# x 32, 70# x 22;  Forward BOSU (round side up) lunges without UE support alternating leading LE x 10 each; Lateral BOSU (round side up) lunges without UE support alternating leading LE x 10 each;  Sit to stand with Airex pad under feet and 5kg overhead ball press 2 x 10;   Neuromuscular Re-education All balance exercises performed without UE support: Step-ups with 6" step with Airex pad on top alternating LE x 10 each; 1/2 foam roll A/P balance x 60s; 1/2 foam roll A/P heel/toe rocks x 60s; 1/2 foam roll tandem stance balance alternating forward LE 2 x 30s each;   Pt educated throughout session about proper posture and technique with exercises. Improved exercise technique, movement at target joints, use of target muscles after min to mod verbal, visual, tactile cues.   Pt demonstrates excellent motivation during session today. Performed both balance and strengtheningexercises with patient today and patient is able to progress resistance on the leg press significantly again today.  Utilize Bosu ball again for stability challenge during lunges.  Also continued practice with half foam roller for balance. He has not achieved maximal benefit from PT at this time and hewould continue  tobenefit from PT services to address deficits in strength, balance, and mobility in order to return to full function at home.                            PT Short Term Goals - 08/30/20 1153      PT SHORT TERM GOAL #1   Title Patient will be independent in home exercise program to improve strength/mobility for better functional independence with ADLs    Baseline 7/1 HEP given    Time 2    Period Weeks    Status On-going    Target Date 07/11/20             PT Long Term Goals - 08/30/20 1154      PT LONG TERM GOAL #1   Title Patient will increase  FOTO score to equal to or greater than 71 %   to demonstrate statistically significant improvement in mobility and quality of life.    Baseline 7/1: 01% 7/51: RECERT: 02.5%; 8/52/77: 66    Time 8    Period Weeks    Status Partially Met    Target Date 10/18/20      PT LONG TERM GOAL #2   Title Patient will demonstrate an improved Berg Balance Score of >47/56 as to demonstrate improved balance with ADLs such as sitting/standing and transfer balance and reduced fall risk.    Baseline 7/1: 82/42 3/53: RECERT 61/44; 02/20/39: 53/56    Time 8    Period Weeks    Status Achieved      PT LONG TERM GOAL #3   Title Patient will increase six minute walk test distance to >1500 for progression to community ambulator age norm and improve gait ability    Baseline 7/1: 1159 ft with excessive foot drag; 0/86: RECERT 761 ft; 9/50/93: 1340'    Time 8    Period Weeks    Status Partially Met    Target Date 10/18/20      PT LONG TERM GOAL #4   Title Patient will increase BLE gross strength to 4+/5 as to improve functional strength for independent gait, increased standing tolerance and increased ADL ability.    Baseline 7/1: see chart 2/67: RECERT see note; 12/31/56: see note;    Time 8    Period Weeks    Status On-going    Target Date 10/18/20      PT LONG TERM GOAL #5   Title Patient will safely negotiate obstacles in a crowded room without running into object on his left, with good foot clearance, and arm swing    Baseline 7/1: unable to 0/99: recert: unable; 8/33/82: Not bumping into objects, still with decreased L arm swing as well as decreased L foot clearance    Time 8    Period Weeks    Status Partially Met    Target Date 10/18/20                 Plan - 09/26/20 1400    Clinical Impression Statement Pt demonstrates excellent motivation during session today. Performed both balance and strengthening exercises with patient today and patient is able to progress resistance on the leg press  significantly again today.  Utilize Bosu ball again for stability challenge during lunges.  Also continued practice with half foam roller for balance. He has not achieved maximal benefit from PT at this time and he would continue to benefit from PT services to address  deficits in strength, balance, and mobility in order to return to full function at home.    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    PT Frequency 2x / week    PT Duration 8 weeks    PT Treatment/Interventions ADLs/Self Care Home Management;Aquatic Therapy;Biofeedback;Canalith Repostioning;Cryotherapy;Advice worker;Iontophoresis 13m/ml Dexamethasone;Moist Heat;Ultrasound;Therapeutic exercise;Therapeutic activities;Functional mobility training;Stair training;DME Instruction;Balance training;Neuromuscular re-education;Patient/family education;Orthotic Fit/Training;Manual techniques;Passive range of motion;Dry needling;Energy conservation;Splinting;Taping;Vestibular;Vasopneumatic Device;Manual lymph drainage    PT Next Visit Plan ankle stability exercises, spatial awareness for L side, dual task.    PT Home Exercise Plan see above    Consulted and Agree with Plan of Care Patient           Patient will benefit from skilled therapeutic intervention in order to improve the following deficits and impairments:  Abnormal gait, Decreased activity tolerance, Decreased balance, Decreased endurance,  Decreased coordination, Decreased cognition, Decreased knowledge of use of DME, Decreased mobility, Decreased safety awareness, Difficulty walking, Decreased strength, Impaired flexibility, Impaired perceived functional ability, Impaired UE functional use, Impaired vision/preception, Postural dysfunction, Improper body mechanics  Visit Diagnosis: Muscle weakness (generalized)  Unsteadiness on feet     Problem List Patient Active Problem List   Diagnosis Date Noted  . Malnutrition of moderate degree 06/25/2020  . Fall   . Palliative care by specialist   . [place]achnoid hemorrhage ([place] 06/23/2020  . HYPERCHOLESTEROLEMIA 12/04/2008  . 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 SUPERFICIAL PHLEBITIS 12/04/2008  . CHRONIC PROSTATITIS 12/04/2008  . PROSTATE SPECIFIC ANTIGEN, ELEVATED 12/04/2008   JPhillips GroutPT, DPT, GCS  Maryclare Nydam 09/26/2020, 3:52 PM  [place] [facility] [address] [address]Phone: [phone]  Fax:  [phone] Name: JITHIEL LIEBLERMRN: [id number] of Birth: 2Jun 01, 1942

## 2020-10-01 ENCOUNTER — Ambulatory Visit: Payer: Medicare HMO

## 2020-10-01 ENCOUNTER — Other Ambulatory Visit: Payer: Self-pay

## 2020-10-01 DIAGNOSIS — M6281 Muscle weakness (generalized): Secondary | ICD-10-CM

## 2020-10-01 DIAGNOSIS — R2681 Unsteadiness on feet: Secondary | ICD-10-CM

## 2020-10-01 NOTE — Therapy (Signed)
[place] MAIN [facility] SERVICES [address] [place], [place], [address] Phone: [phone]   Fax:  [phone]  Physical Therapy Treatment  Patient Details  Name: BLU LORI MRN: [id number] Date of Birth: [birth year] Referring Provider (PT): Willette Cluster   Encounter Date: 10/01/2020   PT End of Session - 10/01/20 1303    Visit Number 18    Number of Visits 33    Date for PT Re-Evaluation 10/18/20    Authorization Type initial eval: 6/10; re-eval: 06/27/20; PN: 08/30/20    PT Start Time 1300    PT Stop Time 1345    PT Time Calculation (min) 45 min    Equipment Utilized During Treatment Gait belt    Activity Tolerance Patient tolerated treatment well;No increased pain    Behavior During Therapy WFL for tasks assessed/performed           Past Medical History:  Diagnosis Date   Stroke [facility])     Past Surgical History:  Procedure Laterality Date   BACK SURGERY      There were no vitals filed for this visit.   Subjective Assessment - 10/01/20 1302    Subjective Pt doing well, no pain today. No falls or stumbles since last therapy session. No specific questions or concerns currently.    Pertinent History Patient returning to PT after hospitalization 7/17-7/19. Was walking in the heat and fainted, bruising throughout body on L side.  Hasn't gone walking since returned home from hospitalization due to weakness. hx of prior R MCA stroke, HTN, HLD, and DVT. He had two ischemic strokes in <3 weeks (4/11 and 4/28)  with acute left arm weakness, numbness and dysarthria. Imaging on 4/12 positive for PFO with an WF > 55%, mild LVH and no thrombus. He also has an atrial septal aneurysm PMh includes  He had a driving safety test on 06/04/20 but was not cleared to return to driving yet. Has meals on wheels to help with lunch. Apartment complex has a gym    Currently in Pain? No/denies              TREATMENT   Ther-ex Octane L9fr  warm-up during history x 5 minutes (4 minutes unbilled): Precor BLEleg press100# x 32, 115# x 22; Precor BLE heel raise 70# x 32, 85# x 32; Matrix resisted walking 17.5# forward, backward, R lateral, and L lateral x 3 each direction Sit to stand without UE support and Airex under feet x 10;   Neuromuscular Re-education All balance exercises performed without UE support: 1/2 foam roll A/P balance x 60s; 1/2 foam roll A/P heel/toe rocks x 60s; 1/2 foam roll tandem stance balance alternating forward LE 2 x 30s each; Airex balance beam side steps x 4 lengths (2 each direction); Airex balance beam tandem walking x 4 lengths;   Pt educated throughout session about proper posture and technique with exercises. Improved exercise technique, movement at target joints, use of target muscles after min to mod verbal, visual, tactile cues.   Pt demonstrates excellent motivation during session today. Performed both balance and strengtheningexercises with patient today and patient is able to progress resistance on the leg press significantly again today.  Utilized Matrix for resisted gait today which is challenging for patient during session. 1/2 foam roll utilized again for challenge on unstable surface. He has not achieved maximal benefit from PT at this time and hewould continue tobenefit from PT services to address deficits in strength, balance, and mobility  in order to return to full function at home.                           PT Short Term Goals - 08/30/20 1153      PT SHORT TERM GOAL #1   Title Patient will be independent in home exercise program to improve strength/mobility for better functional independence with ADLs    Baseline 7/1 HEP given    Time 2    Period Weeks    Status On-going    Target Date 07/11/20             PT Long Term Goals - 08/30/20 1154      PT LONG TERM GOAL #1   Title Patient will increase FOTO score to equal to or greater  than 71 %   to demonstrate statistically significant improvement in mobility and quality of life.    Baseline 7/1: 11% 9/41: RECERT: 74.0%; 07/22/47: 66    Time 8    Period Weeks    Status Partially Met    Target Date 10/18/20      PT LONG TERM GOAL #2   Title Patient will demonstrate an improved Berg Balance Score of >47/56 as to demonstrate improved balance with ADLs such as sitting/standing and transfer balance and reduced fall risk.    Baseline 7/1: 18/56 3/14: RECERT 97/02; 6/37/85: 53/56    Time 8    Period Weeks    Status Achieved      PT LONG TERM GOAL #3   Title Patient will increase six minute walk test distance to >1500 for progression to community ambulator age norm and improve gait ability    Baseline 7/1: 1159 ft with excessive foot drag; 8/85: RECERT 027 ft; 7/41/28: 1340'    Time 8    Period Weeks    Status Partially Met    Target Date 10/18/20      PT LONG TERM GOAL #4   Title Patient will increase BLE gross strength to 4+/5 as to improve functional strength for independent gait, increased standing tolerance and increased ADL ability.    Baseline 7/1: see chart 7/86: RECERT see note; 7/67/20: see note;    Time 8    Period Weeks    Status On-going    Target Date 10/18/20      PT LONG TERM GOAL #5   Title Patient will safely negotiate obstacles in a crowded room without running into object on his left, with good foot clearance, and arm swing    Baseline 7/1: unable to 9/47: recert: unable; 0/96/28: Not bumping into objects, still with decreased L arm swing as well as decreased L foot clearance    Time 8    Period Weeks    Status Partially Met    Target Date 10/18/20                 Plan - 10/01/20 1303    Clinical Impression Statement Pt demonstrates excellent motivation during session today. Performed both balance and strengthening exercises with patient today and patient is able to progress resistance on the leg press significantly again today.   Utilized Matrix for resisted gait today which is challenging for patient during session. 1/2 foam roll utilized again for challenge on unstable surface. He has not achieved maximal benefit from PT at this time and he would continue to benefit from PT services to address deficits in strength, balance, and mobility in order to  return to full function at home.    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    PT Frequency 2x / week    PT Duration 8 weeks    PT Treatment/Interventions ADLs/Self Care Home Management;Aquatic Therapy;Biofeedback;Canalith Repostioning;Cryotherapy;Advice worker;Iontophoresis 57m/ml Dexamethasone;Moist Heat;Ultrasound;Therapeutic exercise;Therapeutic activities;Functional mobility training;Stair training;DME Instruction;Balance training;Neuromuscular re-education;Patient/family education;Orthotic Fit/Training;Manual techniques;Passive range of motion;Dry needling;Energy conservation;Splinting;Taping;Vestibular;Vasopneumatic Device;Manual lymph drainage    PT Next Visit Plan ankle stability exercises, spatial awareness for L side, dual task.    PT Home Exercise Plan see above    Consulted and Agree with Plan of Care Patient           Patient will benefit from skilled therapeutic intervention in order to improve the following deficits and impairments:  Abnormal gait, Decreased activity tolerance, Decreased balance, Decreased endurance,  Decreased coordination, Decreased cognition, Decreased knowledge of use of DME, Decreased mobility, Decreased safety awareness, Difficulty walking, Decreased strength, Impaired flexibility, Impaired perceived functional ability, Impaired UE functional use, Impaired vision/preception, Postural dysfunction, Improper body mechanics  Visit Diagnosis: Muscle weakness (generalized)  Unsteadiness on feet     Problem List Patient Active Problem List   Diagnosis Date Noted   Malnutrition of moderate degree 06/25/2020   Fall    Palliative care by specialist    [place] 06/[place]   HYPERCHOLESTEROLEMIA 12/04/2008   RESTLESS LEGS SYNDROME 12/04/2008   RAYNAUDS SYNDROME 12/04/2008   SUPERFICIAL PHLEBITIS 12/04/2008   CHRONIC PROSTATITIS 12/04/2008   PROSTATE SPECIFIC ANTIGEN, ELEVATED 12/04/2008   JPhillips GroutPT, DPT, GCS  Shatarra Wehling 10/01/2020, 1:55 PM  [place] [address] [facility] [address]Phone: [phone]  Fax:  [phone] Name: Edward CHURCHILLMRN: [id number] of Birth: 2April 29, 1942

## 2020-10-03 ENCOUNTER — Ambulatory Visit: Payer: Medicare HMO

## 2020-10-03 ENCOUNTER — Ambulatory Visit: Payer: Medicare HMO | Admitting: Occupational Therapy

## 2020-10-08 ENCOUNTER — Encounter: Payer: Medicare HMO | Admitting: Occupational Therapy

## 2020-10-08 ENCOUNTER — Ambulatory Visit: Payer: Medicare HMO

## 2020-10-10 ENCOUNTER — Ambulatory Visit: Payer: Medicare HMO | Admitting: Occupational Therapy

## 2020-10-10 ENCOUNTER — Other Ambulatory Visit: Payer: Self-pay

## 2020-10-10 ENCOUNTER — Encounter: Payer: Self-pay | Admitting: Occupational Therapy

## 2020-10-10 ENCOUNTER — Ambulatory Visit: Payer: Medicare HMO | Attending: Family Medicine

## 2020-10-10 DIAGNOSIS — R2681 Unsteadiness on feet: Secondary | ICD-10-CM | POA: Diagnosis present

## 2020-10-10 DIAGNOSIS — R278 Other lack of coordination: Secondary | ICD-10-CM | POA: Insufficient documentation

## 2020-10-10 DIAGNOSIS — M6281 Muscle weakness (generalized): Secondary | ICD-10-CM | POA: Diagnosis present

## 2020-10-10 NOTE — Therapy (Signed)
[place] MAIN [facility] SERVICES [address] [place], [place], [address] Phone: [phone]   Fax:  [phone]  Physical Therapy Treatment  Patient Details  Name: Edward Campbell MRN: [id number] Date of Birth: [birth year] Referring Provider (PT): Willette Cluster   Encounter Date: 10/10/2020   PT End of Session - 10/10/20 1042    Visit Number 19    Number of Visits 33    Date for PT Re-Evaluation 10/18/20    Authorization Type initial eval: 6/10; re-eval: 06/27/20; PN: 08/30/20    PT Start Time 1100    PT Stop Time 1145    PT Time Calculation (min) 45 min    Equipment Utilized During Treatment Gait belt    Activity Tolerance Patient tolerated treatment well;No increased pain    Behavior During Therapy WFL for tasks assessed/performed           Past Medical History:  Diagnosis Date  . Stroke [facility])     Past Surgical History:  Procedure Laterality Date  . BACK SURGERY      There were no vitals filed for this visit.   Subjective Assessment - 10/10/20 1042    Subjective Pt doing well, no pain today. No falls or stumbles since last therapy session. No specific questions or concerns currently.    Pertinent History Patient returning to PT after hospitalization 7/17-7/19. Was walking in the heat and fainted, bruising throughout body on L side.  Hasn't gone walking since returned home from hospitalization due to weakness. hx of prior R MCA stroke, HTN, HLD, and DVT. He had two ischemic strokes in <3 weeks (4/11 and 4/28)  with acute left arm weakness, numbness and dysarthria. Imaging on 4/12 positive for PFO with an WF > 55%, mild LVH and no thrombus. He also has an atrial septal aneurysm PMh includes  He had a driving safety test on 06/04/20 but was not cleared to return to driving yet. Has meals on wheels to help with lunch. Apartment complex has a gym    Currently in Pain? No/denies              TREATMENT   Ther-ex Octane L32fr  warm-up during history x 5 minutes (4 minutes unbilled): Precor BLEleg press100# x 22, 115# x 22;  Standing exercises in // bars with 5# ankle weights and BUE support: Marches x 22 BLE; Hip abduction x 22 BLE; Hip extension x 22 BLE; HS curls x 22 BLE;  Standing heel raises with BUE support x 22; Seated LAQ with 5# ankle weights x 22 BLE; Seated ankle DF with green tband x 10 BLE;  Sit to stand without UE support and Airex under feet x 10, added 5kg overhead ball press x 10;   Neuromuscular Re-education All balance exercises performed without UE support: 1/2 foam roll A/P balance x 60s; 1/2 foam roll A/P heel/toe rocks x 60s; 1/2 foam roll tandem stance balance alternating forward LE 2 x 30s each;    Pt educated throughout session about proper posture and technique with exercises. Improved exercise technique, movement at target joints, use of target muscles after min to mod verbal, visual, tactile cues.   Pt demonstrates excellent motivation during session today. Performed both balance and strengtheningexercises with patient today.  Utilized 5 pound ankle weights for standing exercises.  1/2 foam roll utilized again for challenge on unstable surface.  Patient will need updated outcome measures and goals at next session.  Depending on performance on outcome measures patient  will likely be ready for discharge soon. At this time hewould continue tobenefit from PT services to address deficits in strength, balance, and mobility in order to return to full function at home.                          PT Short Term Goals - 08/30/20 1153      PT SHORT TERM GOAL #1   Title Patient will be independent in home exercise program to improve strength/mobility for better functional independence with ADLs    Baseline 7/1 HEP given    Time 2    Period Weeks    Status On-going    Target Date 07/11/20             PT Long Term Goals - 08/30/20 1154      PT  LONG TERM GOAL #1   Title Patient will increase FOTO score to equal to or greater than 71 %   to demonstrate statistically significant improvement in mobility and quality of life.    Baseline 7/1: 05% 1/83: RECERT: 35.8%; 2/51/89: 66    Time 8    Period Weeks    Status Partially Met    Target Date 10/18/20      PT LONG TERM GOAL #2   Title Patient will demonstrate an improved Berg Balance Score of >47/56 as to demonstrate improved balance with ADLs such as sitting/standing and transfer balance and reduced fall risk.    Baseline 7/1: 84/21 0/31: RECERT 28/11; 8/86/77: 53/56    Time 8    Period Weeks    Status Achieved      PT LONG TERM GOAL #3   Title Patient will increase six minute walk test distance to >1500 for progression to community ambulator age norm and improve gait ability    Baseline 7/1: 1159 ft with excessive foot drag; 3/73: RECERT 668 ft; 1/59/47: 1340'    Time 8    Period Weeks    Status Partially Met    Target Date 10/18/20      PT LONG TERM GOAL #4   Title Patient will increase BLE gross strength to 4+/5 as to improve functional strength for independent gait, increased standing tolerance and increased ADL ability.    Baseline 7/1: see chart 0/76: RECERT see note; 1/51/83: see note;    Time 8    Period Weeks    Status On-going    Target Date 10/18/20      PT LONG TERM GOAL #5   Title Patient will safely negotiate obstacles in a crowded room without running into object on his left, with good foot clearance, and arm swing    Baseline 7/1: unable to 4/37: recert: unable; 3/57/89: Not bumping into objects, still with decreased L arm swing as well as decreased L foot clearance    Time 8    Period Weeks    Status Partially Met    Target Date 10/18/20                 Plan - 10/10/20 1043    Clinical Impression Statement Pt demonstrates excellent motivation during session today. Performed both balance and strengthening exercises with patient today.  Utilized 5  pound ankle weights for standing exercises.  1/2 foam roll utilized again for challenge on unstable surface.  Patient will need updated outcome measures and goals at next session.  Depending on performance on outcome measures patient will likely be ready for discharge  soon. At this time he would continue to benefit from PT services to address deficits in strength, balance, and mobility in order to return to full function at home.    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    PT Frequency 2x / week    PT Duration 8 weeks    PT Treatment/Interventions ADLs/Self Care Home Management;Aquatic Therapy;Biofeedback;Canalith Repostioning;Cryotherapy;Advice worker;Iontophoresis 16m/ml Dexamethasone;Moist Heat;Ultrasound;Therapeutic exercise;Therapeutic activities;Functional mobility training;Stair training;DME Instruction;Balance training;Neuromuscular re-education;Patient/family education;Orthotic Fit/Training;Manual techniques;Passive range of motion;Dry needling;Energy conservation;Splinting;Taping;Vestibular;Vasopneumatic Device;Manual lymph drainage    PT Next Visit Plan Update outcome measures and goals, ankle stability exercises, spatial awareness for L side, dual task.    PT Home Exercise Plan see above    Consulted and Agree with Plan of Care Patient           Patient will benefit from skilled therapeutic intervention in order to improve the following  deficits and impairments:  Abnormal gait, Decreased activity tolerance, Decreased balance, Decreased endurance, Decreased coordination, Decreased cognition, Decreased knowledge of use of DME, Decreased mobility, Decreased safety awareness, Difficulty walking, Decreased strength, Impaired flexibility, Impaired perceived functional ability, Impaired UE functional use, Impaired vision/preception, Postural dysfunction, Improper body mechanics  Visit Diagnosis: Muscle weakness (generalized)  Unsteadiness on feet     Problem List Patient Active Problem List   Diagnosis Date Noted  . Malnutrition of moderate degree 06/25/2020  . Fall   . Palliative care by specialist   . [place] 06/23/[place]  . HYPERCHOLESTEROLEMIA 12/04/2008  . 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 SUPERFICIAL PHLEBITIS 12/04/2008  . CHRONIC PROSTATITIS 12/04/2008  . PROSTATE SPECIFIC ANTIGEN, ELEVATED 12/04/2008   JPhillips GroutPT, DPT, GCS  Inika Bellanger 10/11/2020, 9:37 AM  [place] [facility] [address] [address]Phone: [phone]  Fax:  [phone] Name: Edward BENALLYMRN: [id number] of Birth: [birth year]

## 2020-10-10 NOTE — Therapy (Addendum)
[place] [facility] MAIN [facility] SERVICES [address] [place], [place], [address] Phone: [phone]   Fax:  [phone]  Occupational Therapy Treatment  Patient Details  Name: Edward Campbell MRN: [id number] Date of Birth: [birth year] Referring Provider (OT): Dr.  Rolin Barry   Encounter Date: 10/10/2020   OT End of Session - 10/10/20 1112    Visit Number 17    Number of Visits 24    Date for OT Re-Evaluation 12/05/20    Authorization Time Period Progress report period starting 06/20/2020    OT Start Time 1145   OT Stop Time 1230   OT Time Calculation (min) 45 min    Equipment Utilized During Treatment dynamometer, 9 hold peg test, pinch test.    Activity Tolerance Patient tolerated treatment well    Behavior During Therapy [facility] for tasks assessed/performed           Past Medical History:  Diagnosis Date  . Stroke [facility])     Past Surgical History:  Procedure Laterality Date  . BACK SURGERY      There were no vitals filed for this visit.   Subjective Assessment - 10/10/20 1111    Subjective  Pt. Reports that he missed the last appointment as he had to wait for something to arrive at his home.   Pertinent History Pt is an 79 y/o M with PMH: CVA with acute L sided weakness, numbness and dysarthria on 4/11 and 4/28 requiring hospitalization. Other pertinent medical history: includes HTN, HLD, and DVT.    Limitations LUE strength, motor control, and [facility] skills.    Currently in Pain? No/denies           OT TREATMENT    Neuro muscular re-education:  Pt. worked on [facility] skills using the [employer] Task. Pt. worked on sustaining grasp on the resistive tweezers while grasping this sticks, and moving them from a horizontal position to a vertical position to prepare for placing them into the pegboard. Pt. required verbal cues, and cues for visual demonstration for wrist position, and hand pattern when placing them into the pegboard.  Pt. performed [facility] tasks using the Grooved pegboard. Pt. worked on grasping the grooved pegs from a horizontal position, and moving the pegs to a vertical position in the hand to prepare for placing them in the grooved slot.   Pt. is making steady progress, and his now having an easier time putting dishes away, and pick up items off of the floor. Pt. continues to present with limited coordination. Pt. was able to manipulate the resistive tweezers while grasping the thin pegs form a horizontal position while moving them to a vertical position in preparation for placing them into a pegboard. Pt. has less difficulty manipulating the tweezers when grasping the 1" thin sticks. Pt. requires increased time to complete. Pt. required verbal cues, and visual demonstration for hand placement on the tweezers when removing the pegs. Pt. continues to work on improving LUE functioning in order to improve, and maximize overall independence with ADLs, and IADL tasks.                        OT Education - 10/10/20 1112    Education Details FMC, hand function, bi-manual coordination    Person(s) Educated Patient    Methods Explanation;Demonstration    Comprehension Verbalized understanding;Returned demonstration            OT Short Term Goals - 09/13/20 7741  OT SHORT TERM GOAL #1   Title Pt will be compliant with HEP with green theraputty to increase fine motor control and independence.    Baseline Pt is independent with Theraputty HEP.    Time 4    Period Weeks    Status Achieved    Target Date 07/18/20             OT Long Term Goals - 09/12/20 1321      OT LONG TERM GOAL #1   Title Pt will increase FOTO goal from 68 to 71 to improve performance of HH IADL tasks and overall QOL.    Baseline 7/14, FOTO score 68, 10th visit:  72    Time 12    Period Weeks    Status On-going    Target Date 12/05/20      OT LONG TERM GOAL #2   Title Pt. will improve left grip strength  by 10# to be able to improve his ability to hold ADL objects  in his hand.    Baseline 10.05/2020: 60#    Time 12    Period Weeks    Status On-going    Target Date 12/05/20      OT LONG TERM GOAL #3   Title Pt. will improve LUE motor control in order to independently reach up to place items on a shelf.    Baseline 12/05/2020: Pt. is now able to reach up to place multiple items at one time on a shelf with hid left hand.    Time 12    Period Weeks    Status On-going    Target Date 12/05/20      OT LONG TERM GOAL #4   Title Pt. will improve Left hand FMC skills by 3 sec. to be able to manipulate small objects independently for ADLs.    Baseline 7/14 L hand 39 seconds (R hand 27 sec.) 10th  visit 37 secs left 09/12/2020: Left 37 sec., Right 24 sec.    Time 12    Period Weeks    Status On-going    Target Date 12/05/20      OT LONG TERM GOAL #5   Title Pt. will improve left hand function to be able to independently, and efficiently separate multiple sheets of papers during bill management.    Baseline 09/12/2020:Pt. has difficulty performing.    Time 12    Period Weeks    Status New    Target Date 12/05/20      Long Term Additional Goals   Additional Long Term Goals Yes      OT LONG TERM GOAL #6   Title Pt. will independently type a one paragraph email correspondence accurately, and efficiently.    Baseline 09/12/2020: Pt has difficulty typing a one paragraph email correspondence efficiently.    Time 12    Period Weeks    Status New    Target Date 12/05/20                 Plan - 10/10/20 1112    Clinical Impression Statement Pt. is making steady progress, and his now having an easier time putting dishes away, and pick up items off of the floor. Pt. continues to present with limited coordination. Pt. was able to manipulate the resistive tweezers while grasping the thin pegs form a horizontal position while moving them to a vertical position in preparation for placing them  into a pegboard. Pt. has less difficulty manipulating the tweezers when grasping the  1" thin sticks. Pt. requires increased time to complete. Pt. required verbal cues, and visual demonstration for hand placement on the tweezers when removing the pegs. Pt. continues to work on improving LUE functioning in order to improve, and maximize overall independence with ADLs, and IADL tasks.   OT Occupational Profile and History Detailed Assessment- Review of Records and additional review of physical, cognitive, psychosocial history related to current functional performance    Occupational performance deficits (Please refer to evaluation for details): ADL's;IADL's    Body Structure / Function / Physical Skills ADL;Balance;FMC;GMC;Coordination;IADL;Strength;UE functional use    Rehab Potential Excellent    Clinical Decision Making Limited treatment options, no task modification necessary    Comorbidities Affecting Occupational Performance: May have comorbidities impacting occupational performance    Modification or Assistance to Complete Evaluation  Min-Moderate modification of tasks or assist with assess necessary to complete eval    OT Frequency 2x / week    OT Duration 12 weeks    OT Treatment/Interventions Self-care/ADL training;Therapeutic exercise;Patient/family education;DME and/or AE instruction;Therapeutic activities;Neuromuscular education    Plan Therapeutic activities to enhance FMC/control, self-care re-training to modify as needed, neuromuscular education to enhance L side awareness as able, patient education to enhance safety and carryover outside of therapy, Therapeutic exercise to improve strength as it pertains to safe and efficient performance of ADLs.    OT Home Exercise Plan Use green putty to increase digit flex/ext strength, stretching wrist/digits and rest breaks between exercise to prevent cramping    Consulted and Agree with Plan of Care Patient           Patient will benefit from  skilled therapeutic intervention in order to improve the following deficits and impairments:   Body Structure / Function / Physical Skills: ADL, Balance, FMC, GMC, Coordination, IADL, Strength, UE functional use       Visit Diagnosis: Muscle weakness (generalized)  Other lack of coordination    Problem List Patient Active Problem List   Diagnosis Date Noted  . Malnutrition of moderate degree 06/25/2020  . Fall   . Palliative care by specialist   . Subarachnoid hemorrhage (HCC) 06/23/2020  . HYPERCHOLESTEROLEMIA 12/04/2008  . 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 SUPERFICIAL PHLEBITIS 12/04/2008  . CHRONIC PROSTATITIS 12/04/2008  . PROSTATE SPECIFIC ANTIGEN, ELEVATED 12/04/2008    Olegario Messier, MS, OTR/L 10/10/2020,  [facility] MAIN [facility] SERVICES [address] [place], [place], [address] Phone: [phone]   Fax:  [phone]  Name: Edward Campbell MRN: [id number] Date of Birth: 07/28/1941

## 2020-10-15 ENCOUNTER — Ambulatory Visit: Payer: Medicare HMO

## 2020-10-15 ENCOUNTER — Encounter: Payer: Self-pay | Admitting: Occupational Therapy

## 2020-10-15 ENCOUNTER — Other Ambulatory Visit: Payer: Self-pay

## 2020-10-15 ENCOUNTER — Ambulatory Visit: Payer: Medicare HMO | Admitting: Occupational Therapy

## 2020-10-15 VITALS — BP 117/65 | HR 73

## 2020-10-15 DIAGNOSIS — M6281 Muscle weakness (generalized): Secondary | ICD-10-CM | POA: Diagnosis not present

## 2020-10-15 DIAGNOSIS — R278 Other lack of coordination: Secondary | ICD-10-CM

## 2020-10-15 DIAGNOSIS — R2681 Unsteadiness on feet: Secondary | ICD-10-CM

## 2020-10-15 NOTE — Therapy (Signed)
[facility] MAIN [facility] SERVICES [address] [place], [place], [address] Phone: [phone]   Fax:  [phone]  Physical Therapy Treatment/Progress Note/Discharge  Dates of reporting period  08/30/20   to   10/15/20  Patient Details  Name: Edward Campbell MRN: [id number] Date of Birth: 1940-12-17 Referring Provider (PT): Virgel Gess   Encounter Date: 10/15/2020   PT End of Session - 10/15/20 1146    Visit Number 20    Number of Visits 33    Date for PT Re-Evaluation 10/18/20    Authorization Type initial eval: 6/10; re-eval: 06/27/20; PN: 08/30/20    PT Start Time 1105    PT Stop Time 1145    PT Time Calculation (min) 40 min    Equipment Utilized During Treatment Gait belt    Activity Tolerance Patient tolerated treatment well;No increased pain    Behavior During Therapy [facility] for tasks assessed/performed           Past Medical History:  Diagnosis Date   Stroke [facility])     Past Surgical History:  Procedure Laterality Date   BACK SURGERY      Vitals:   10/15/20 1122  BP: 117/65  Pulse: 73  SpO2: 99%     Subjective Assessment - 10/15/20 1104    Subjective Pt doing well, no pain today. No falls or stumbles since last therapy session. No specific questions or concerns currently.    Pertinent History Patient returning to PT after hospitalization 7/17-7/19. Was walking in the heat and fainted, bruising throughout body on L side.  Hasn't gone walking since returned home from hospitalization due to weakness. hx of prior R MCA stroke, HTN, HLD, and DVT. He had two ischemic strokes in <3 weeks (4/11 and 4/28)  with acute left arm weakness, numbness and dysarthria. Imaging on 4/12 positive for PFO with an WF > 55%, mild LVH and no thrombus. He also has an atrial septal aneurysm PMh includes  He had a driving safety test on 3/83/77 but was not cleared to return to driving yet. Has meals on wheels to help with lunch. Apartment complex has a  gym              [facility] PT Assessment - 10/15/20 1121      6 Minute Walk- Baseline   6 Minute Walk- Baseline yes    BP (mmHg) 117/65    HR (bpm) 73    02 Sat (%RA) 99 %    Modified Borg Scale for Dyspnea 0- Nothing at all    Perceived Rate of Exertion (Borg) 6-      6 Minute walk- Post Test   6 Minute Walk Post Test yes    BP (mmHg) 148/60    HR (bpm) 92    02 Sat (%RA) 99 %    Modified Borg Scale for Dyspnea 8-    Perceived Rate of Exertion (Borg) 13- Somewhat hard      6 minute walk test results    Aerobic Endurance Distance Walked 1430    Endurance additional comments no assistive device, slight increase in foot drag toward end      Standardized Balance Assessment   Standardized Balance Assessment Berg Balance Test      Berg Balance Test   Sit to Stand Able to stand without using hands and stabilize independently    Standing Unsupported Able to stand safely 2 minutes    Sitting with Back Unsupported but Feet Supported on Floor  or Stool Able to sit safely and securely 2 minutes    Stand to Sit Sits safely with minimal use of hands    Transfers Able to transfer safely, minor use of hands    Standing Unsupported with Eyes Closed Able to stand 10 seconds safely    Standing Unsupported with Feet Together Able to place feet together independently and stand 1 minute safely    From Standing, Reach Forward with Outstretched Arm Can reach confidently >25 cm (10")    From Standing Position, Pick up Object from Floor Able to pick up shoe safely and easily    From Standing Position, Turn to Look Behind Over each Shoulder Looks behind from both sides and weight shifts well    Turn 360 Degrees Able to turn 360 degrees safely in 4 seconds or less    Standing Unsupported, Alternately Place Feet on Step/Stool Able to stand independently and safely and complete 8 steps in 20 seconds    Standing Unsupported, One Foot in Front Able to place foot tandem independently and hold 30 seconds     Standing on One Leg Able to lift leg independently and hold > 10 seconds    Total Score 56      Functional Gait  Assessment   Gait assessed  Yes    Gait Level Surface Walks 20 ft in less than 5.5 sec, no assistive devices, good speed, no evidence for imbalance, normal gait pattern, deviates no more than 6 in outside of the 12 in walkway width.    Change in Gait Speed Able to smoothly change walking speed without loss of balance or gait deviation. Deviate no more than 6 in outside of the 12 in walkway width.    Gait with Horizontal Head Turns Performs head turns smoothly with slight change in gait velocity (eg, minor disruption to smooth gait path), deviates 6-10 in outside 12 in walkway width, or uses an assistive device.    Gait with Vertical Head Turns Performs task with slight change in gait velocity (eg, minor disruption to smooth gait path), deviates 6 - 10 in outside 12 in walkway width or uses assistive device    Gait and Pivot Turn Pivot turns safely within 3 sec and stops quickly with no loss of balance.    Step Over Obstacle Is able to step over 2 stacked shoe boxes taped together (9 in total height) without changing gait speed. No evidence of imbalance.    Gait with Narrow Base of Support Is able to ambulate for 10 steps heel to toe with no staggering.    Gait with Eyes Closed Walks 20 ft, slow speed, abnormal gait pattern, evidence for imbalance, deviates 10-15 in outside 12 in walkway width. Requires more than 9 sec to ambulate 20 ft.    Ambulating Backwards Walks 20 ft, uses assistive device, slower speed, mild gait deviations, deviates 6-10 in outside 12 in walkway width.    Steps Alternating feet, must use rail.    Total Score 24            TREATMENT    Neuromuscular Re-education  Performed 6MWT with patient who ambulated 1430' without an assistive device and slight increase in L foot drag as distance increased (see above for vitals) BERG performed and pt scored  56/56; FGA performed and pt scored 24/30;  Reviewed HEP with patient; MMT performed with patient (see below):   STRENGTH:Graded on a 0-5 scale  Muscle Group Left Right  Hip Flex 5/5 5/5  Hip Abd 4+/5 4/5  Hip Add 4/5 4+/5  Hip Ext 4/5 4/5  Hip IR/ER 5/5 5/5  Knee Flex 4+/5 4+/5  Knee Ext 5/5 5/5  Ankle DF 5/5 5/5  Ankle PF NT NT    Updated outcome measures and goals with patient today. Pt demonstrates very significant improvement in his outcome measures since they were last performed in September. His 6MWT has increased to 1430' and his FOTO score improved to 69. His BERG improved from 37/56 at initial evaluation to 56/56 today. His LLE strength has also improved significantly however he is still having intermittent L toe drag. Pt denies any issues with L sided neglect such as striking objects or falling to the left. At this time pt is ready for discharge. Reviewed HEP with patient to ensure independence. Pt encouraged to return for additional therapy if he does not continue to make progress.                        PT Short Term Goals - 10/15/20 1124      PT SHORT TERM GOAL #1   Title Patient will be independent in home exercise program to improve strength/mobility for better functional independence with ADLs    Baseline 7/1 HEP given    Time 2    Period Weeks    Status Achieved    Target Date 07/11/20             PT Long Term Goals - 10/15/20 1124      PT LONG TERM GOAL #1   Title Patient will increase FOTO score to equal to or greater than 71  to demonstrate statistically significant improvement in mobility and quality of life.    Baseline 7/1: 85% 8/85: RECERT: 02.7%; 7/41/28: 66; 10/15/20: 69    Time 8    Period Weeks    Status Partially Met      PT LONG TERM GOAL #2   Title Patient will demonstrate an improved Berg Balance Score of >47/56 as to demonstrate improved balance with ADLs such as sitting/standing and transfer balance and reduced fall  risk.    Baseline 7/1: 78/67 6/72: RECERT 09/47; 0/96/28: 53/56; 10/15/20: 56/56    Time 8    Period Weeks    Status Achieved      PT LONG TERM GOAL #3   Title Patient will increase six minute walk test distance to >1500 for progression to community ambulator age norm and improve gait ability    Baseline 7/1: 1159 ft with excessive foot drag; 3/66: RECERT 294 ft; 7/65/46: 1340'; 10/15/20: 1430'    Time 8    Period Days    Status Partially Met      PT LONG TERM GOAL #4   Title Patient will increase BLE gross strength to 4+/5 as to improve functional strength for independent gait, increased standing tolerance and increased ADL ability.    Baseline 7/1: see chart 5/03: RECERT see note; 5/46/56: see note; 10/15/20: see note    Time 8    Period Weeks    Status Partially Met      PT LONG TERM GOAL #5   Title Patient will safely negotiate obstacles in a crowded room without running into object on his left, with good foot clearance, and arm swing    Baseline 7/1: unable to 8/12: recert: unable; 7/51/70: Not bumping into objects, still with decreased L arm swing as well as decreased L foot clearance  Time 8    Period Weeks    Status Achieved                 Plan - 10/15/20 1148    Clinical Impression Statement Updated outcome measures and goals with patient today. Pt demonstrates very significant improvement in his outcome measures since they were last performed in September. His 6MWT has increased to 1430' and his FOTO score improved to 69. His BERG improved from 37/56 at initial evaluation to 56/56 today. His LLE strength has also improved significantly however he is still having intermittent L toe drag. Pt denies any issues with L sided neglect such as striking objects or falling to the left. At this time pt is ready for discharge. Reviewed HEP with patient to ensure independence. Pt encouraged to return for additional therapy if he does not continue to make progress.    Personal  Factors and Comorbidities Age;Comorbidity 3+;Finances;Past/Current Experience;Transportation    Comorbidities stroke, HLD, lumbar radiculopathy, hypercholesterolemia, restless legs syndrome, raynaud's syndrome, superficial phlebitis, chronic prostatis,    Examination-Activity Limitations Bend;Carry;Hygiene/Grooming;Locomotion Level;Squat;Stairs;Stand;Transfers    Examination-Participation Restrictions Church;Cleaning;Community Activity;Driving;Volunteer;Shop;Yard Work    Product manager    Rehab Potential Fair    PT Frequency 2x / week    PT Duration 8 weeks    PT Treatment/Interventions ADLs/Self Care Home Management;Aquatic Therapy;Biofeedback;Canalith Repostioning;Cryotherapy;Advice worker;Iontophoresis 34m/ml Dexamethasone;Moist Heat;Ultrasound;Therapeutic exercise;Therapeutic activities;Functional mobility training;Stair training;DME Instruction;Balance training;Neuromuscular re-education;Patient/family education;Orthotic Fit/Training;Manual techniques;Passive range of motion;Dry needling;Energy conservation;Splinting;Taping;Vestibular;Vasopneumatic Device;Manual lymph drainage    PT Next Visit Plan Discharged    PT Home Exercise Plan see above    Consulted and Agree with Plan of Care Patient           Patient will benefit from skilled therapeutic intervention in order to improve the following deficits and impairments:  Abnormal gait, Decreased activity tolerance, Decreased balance, Decreased endurance, Decreased coordination, Decreased cognition, Decreased knowledge of use of DME, Decreased mobility, Decreased safety awareness, Difficulty walking, Decreased strength, Impaired flexibility, Impaired perceived functional ability, Impaired UE functional use, Impaired vision/preception, Postural dysfunction, Improper body mechanics  Visit Diagnosis: Muscle weakness (generalized)  Unsteadiness on feet     Problem  List Patient Active Problem List   Diagnosis Date Noted   Malnutrition of moderate degree 06/25/2020   Fall    Palliative care by specialist    Subarachnoid hemorrhage ([facility] 06/23/2020   HYPERCHOLESTEROLEMIA 12/04/2008   RESTLESS LEGS SYNDROME 12/04/2008   RAYNAUDS SYNDROME 12/04/2008   SUPERFICIAL PHLEBITIS 12/04/2008   CHRONIC PROSTATITIS 12/04/2008   PROSTATE SPECIFIC ANTIGEN, ELEVATED 12/04/2008   JLyndel SafeHuprich PT, DPT, GCS  Denise Bramblett 10/15/2020, 11:56 AM  [place] [address]Phone: [phone]  Fax:  [phone] Name: JGABRIAN HOQUEMRN: [id number] of Birth: 201-30-1942

## 2020-10-15 NOTE — Therapy (Signed)
[place] MAIN [facility] SERVICES [address] [place], [place], [address] Phone: [phone]   Fax:  [phone]  Occupational Therapy Treatment  Patient Details  Name: Edward Campbell MRN: [id number] Date of Birth: 1941-05-20 Referring Provider (OT): Dr.  Rolin Barry   Encounter Date: 10/15/2020   OT End of Session - 10/15/20 1203    Visit Number 18    Number of Visits 24    Date for OT Re-Evaluation 12/05/20    Authorization Time Period Progress report period starting 06/20/2020    OT Start Time 1145    OT Stop Time 1230    OT Time Calculation (min) 45 min    Activity Tolerance Patient tolerated treatment well;Patient limited by pain    Behavior During Therapy [facility] for tasks assessed/performed           Past Medical History:  Diagnosis Date   Stroke [facility])     Past Surgical History:  Procedure Laterality Date   BACK SURGERY      There were no vitals filed for this visit.   Subjective Assessment - 10/15/20 1201    Subjective  Pt. reports that he is trying to use his left hand for more tasks at home.    Pertinent History Pt is an 79 y/o M with PMH: CVA with acute L sided weakness, numbness and dysarthria on 4/11 and 4/28 requiring hospitalization. Other pertinent medical history: includes HTN, HLD, and DVT.    Limitations LUE strength, motor control, and [facility] skills.    Special Tests Vision: scanning in tact, peripheral vision in tact in all 4 quadrants, appropriate tracking and convergence.    Currently in Pain? No/denies          OT TREATMENT    Neuro muscular re-education:  Pt. worked on left [facility] skills grasping 1" sticks, 1/4" collars, and 1/4" washers. Pt. worked on storing the objects in the palm, and translatory skills moving the items from the palm of the hand to the tip of the 2nd digit, and thumb. Pt. worked on removing the pegs using bilateral alternating hand patterns. Pt. performed [facility] tasks using the Grooved  pegboard. Pt. worked on grasping the grooved pegs from a horizontal position, and moving the pegs to a vertical position in the hand to prepare for placing them in the grooved slot.   Pt. reports that he has noticed changes with his memory. Pt. reports that he forgot about his coffee brewing when he left the room this morning, forgot about his therapy appointments until transportation knocked on his door this morning, and left the house without his wallet. Pt. dropped multiple objects between his 2nd, 3rd, and 4th digits during translatory movements. Pt. Continues to work on improving Left hand function, and [facility] skills in order to work towards improving, and maximizing independence with ADLs, and IADL tasks.                         OT Education - 10/15/20 1202    Education Details FMC, hand function, bi-manual coordination    Person(s) Educated Patient    Methods Explanation;Demonstration    Comprehension Verbalized understanding;Returned demonstration            OT Short Term Goals - 09/13/20 0858      OT SHORT TERM GOAL #1   Title Pt will be compliant with HEP with green theraputty to increase fine motor control and independence.    Baseline  Pt is independent with Theraputty HEP.    Time 4    Period Weeks    Status Achieved    Target Date 07/18/20             OT Long Term Goals - 09/12/20 1321      OT LONG TERM GOAL #1   Title Pt will increase FOTO goal from 68 to 71 to improve performance of HH IADL tasks and overall QOL.    Baseline 7/14, FOTO score 68, 10th visit:  72    Time 12    Period Weeks    Status On-going    Target Date 12/05/20      OT LONG TERM GOAL #2   Title Pt. will improve left grip strength by 10# to be able to improve his ability to hold ADL objects  in his hand.    Baseline 10.05/2020: 60#    Time 12    Period Weeks    Status On-going    Target Date 12/05/20      OT LONG TERM GOAL #3   Title Pt. will improve LUE motor control  in order to independently reach up to place items on a shelf.    Baseline 12/05/2020: Pt. is now able to reach up to place multiple items at one time on a shelf with hid left hand.    Time 12    Period Weeks    Status On-going    Target Date 12/05/20      OT LONG TERM GOAL #4   Title Pt. will improve Left hand FMC skills by 3 sec. to be able to manipulate small objects independently for ADLs.    Baseline 7/14 L hand 39 seconds (R hand 27 sec.) 10th  visit 37 secs left 09/12/2020: Left 37 sec., Right 24 sec.    Time 12    Period Weeks    Status On-going    Target Date 12/05/20      OT LONG TERM GOAL #5   Title Pt. will improve left hand function to be able to independently, and efficiently separate multiple sheets of papers during bill management.    Baseline 09/12/2020:Pt. has difficulty performing.    Time 12    Period Weeks    Status New    Target Date 12/05/20      Long Term Additional Goals   Additional Long Term Goals Yes      OT LONG TERM GOAL #6   Title Pt. will independently type a one paragraph email correspondence accurately, and efficiently.    Baseline 09/12/2020: Pt has difficulty typing a one paragraph email correspondence efficiently.    Time 12    Period Weeks    Status New    Target Date 12/05/20                 Plan - 10/15/20 1203    Clinical Impression Statement Pt. reports that he has noticed changes with his memory. Pt. reports that he forgot about his coffee brewing when he left the room this morning, forgot about his therapy appointments until transportation knocked on his door this morning, and left the house without his wallet. Pt. dropped multiple objects between his 2nd, 3rd, and 4th digits during translatory movements. Pt. Continues to work on improving Left hand function, and [facility] skills in order to work towards improving, and maximizing independence with ADLs, and IADL tasks.   OT Occupational Profile and History Detailed Assessment- Review  of Records and additional  review of physical, cognitive, psychosocial history related to current functional performance    Occupational performance deficits (Please refer to evaluation for details): ADL's;IADL's    Body Structure / Function / Physical Skills ADL;Balance;FMC;GMC;Coordination;IADL;Strength;UE functional use    Rehab Potential Excellent    Clinical Decision Making Limited treatment options, no task modification necessary    Comorbidities Affecting Occupational Performance: May have comorbidities impacting occupational performance    Modification or Assistance to Complete Evaluation  Min-Moderate modification of tasks or assist with assess necessary to complete eval    OT Frequency 2x / week    OT Duration 12 weeks    OT Treatment/Interventions Self-care/ADL training;Therapeutic exercise;Patient/family education;DME and/or AE instruction;Therapeutic activities;Neuromuscular education    Plan Therapeutic activities to enhance FMC/control, self-care re-training to modify as needed, neuromuscular education to enhance L side awareness as able, patient education to enhance safety and carryover outside of therapy, Therapeutic exercise to improve strength as it pertains to safe and efficient performance of ADLs.    OT Home Exercise Plan Use green putty to increase digit flex/ext strength, stretching wrist/digits and rest breaks between exercise to prevent cramping    Consulted and Agree with Plan of Care Patient           Patient will benefit from skilled therapeutic intervention in order to improve the following deficits and impairments:   Body Structure / Function / Physical Skills: ADL, Balance, FMC, GMC, Coordination, IADL, Strength, UE functional use       Visit Diagnosis: Muscle weakness (generalized)  Other lack of coordination    Problem List Patient Active Problem List   Diagnosis Date Noted   Malnutrition of moderate degree 06/25/2020   Fall    Palliative care  by specialist    Subarachnoid hemorrhage (HCC) 06/23/2020   HYPERCHOLESTEROLEMIA 12/04/2008   RESTLESS LEGS SYNDROME 12/04/2008   RAYNAUDS SYNDROME 12/04/2008   SUPERFICIAL PHLEBITIS 12/04/2008   CHRONIC PROSTATITIS 12/04/2008   PROSTATE SPECIFIC ANTIGEN, ELEVATED 12/04/2008    Olegario Messier, MS, OTR/L 10/15/2020, 12:05 PM  [place] [facility] MAIN [facility] SERVICES [address] [place], [place], [address] Phone: [phone]   Fax:  [phone]  Name: BARI HANDSHOE MRN: [id number] Date of Birth: [birth year]

## 2020-10-17 ENCOUNTER — Encounter: Payer: Self-pay | Admitting: Occupational Therapy

## 2020-10-17 ENCOUNTER — Ambulatory Visit: Payer: Medicare HMO | Admitting: Occupational Therapy

## 2020-10-17 ENCOUNTER — Other Ambulatory Visit: Payer: Self-pay

## 2020-10-17 ENCOUNTER — Ambulatory Visit: Payer: Medicare HMO

## 2020-10-17 DIAGNOSIS — R278 Other lack of coordination: Secondary | ICD-10-CM

## 2020-10-17 DIAGNOSIS — M6281 Muscle weakness (generalized): Secondary | ICD-10-CM | POA: Diagnosis not present

## 2020-10-17 NOTE — Therapy (Signed)
[place] [facility] MAIN [facility] SERVICES [address] [place], [place], [address] Phone: [phone]   Fax:  [phone]  Occupational Therapy Treatment  Patient Details  Name: Edward Campbell MRN: [id number] Date of Birth: Apr 15, 1941 Referring Provider (OT): Dr.  Rolin Barry   Encounter Date: 10/17/2020   OT End of Session - 10/17/20 1116    Visit Number 19    Number of Visits 24    Date for OT Re-Evaluation 12/05/20    OT Start Time 1100    OT Stop Time 1145    OT Time Calculation (min) 45 min    Activity Tolerance Patient tolerated treatment well;Patient limited by pain    Behavior During Therapy [facility] for tasks assessed/performed           Past Medical History:  Diagnosis Date   Stroke [facility])     Past Surgical History:  Procedure Laterality Date   BACK SURGERY      There were no vitals filed for this visit.   Subjective Assessment - 10/17/20 1114    Subjective  Pt reports that he is doing well today.    Pertinent History Pt is an 79 y/o M with PMH: CVA with acute L sided weakness, numbness and dysarthria on 4/11 and 4/28 requiring hospitalization. Other pertinent medical history: includes HTN, HLD, and DVT.    Limitations LUE strength, motor control, and [facility] skills.    Special Tests Vision: scanning in tact, peripheral vision in tact in all 4 quadrants, appropriate tracking and convergence.    Currently in Pain? No/denies          OT TREATMENT   Neuro muscular re-education:  Pt. worked on left hand [facility] skills manipulating manual tweezers grasping 1/8" small pegs, and placing them onto a small pegboard. Pt. required cues for compensating proximally with hiking his left shoulder, and elbow when using the manual tweezers. Pt. Presented with no proximal compensation  In the LUE when using resistive tweezers.  Pt. continues to make steady progress with his LUE,  and continues to use his left hand to put dishes away, and pick up  items off of the floor. Pt. continues to present with limited left hand coordination skills. Pt. Presented with more control with using the resistive tweezers over using the manual tweezers.  Pt. requires increased increased time to complete tasks.Pt. continues to work on improving LUE functioning in order to improve, and maximize overall independence with ADLs, and IADL tasks.                        OT Education - 10/17/20 1115    Education Details FMC, hand function, bi-manual coordination    Person(s) Educated Patient    Methods Explanation;Demonstration    Comprehension Verbalized understanding;Returned demonstration            OT Short Term Goals - 09/13/20 0858      OT SHORT TERM GOAL #1   Title Pt will be compliant with HEP with green theraputty to increase fine motor control and independence.    Baseline Pt is independent with Theraputty HEP.    Time 4    Period Weeks    Status Achieved    Target Date 07/18/20             OT Long Term Goals - 09/12/20 1321      OT LONG TERM GOAL #1   Title Pt will increase FOTO goal from 11  to 71 to improve performance of HH IADL tasks and overall QOL.    Baseline 7/14, FOTO score 68, 10th visit:  72    Time 12    Period Weeks    Status On-going    Target Date 12/05/20      OT LONG TERM GOAL #2   Title Pt. will improve left grip strength by 10# to be able to improve his ability to hold ADL objects  in his hand.    Baseline 10.05/2020: 60#    Time 12    Period Weeks    Status On-going    Target Date 12/05/20      OT LONG TERM GOAL #3   Title Pt. will improve LUE motor control in order to independently reach up to place items on a shelf.    Baseline 12/05/2020: Pt. is now able to reach up to place multiple items at one time on a shelf with hid left hand.    Time 12    Period Weeks    Status On-going    Target Date 12/05/20      OT LONG TERM GOAL #4   Title Pt. will improve Left hand FMC skills by 3  sec. to be able to manipulate small objects independently for ADLs.    Baseline 7/14 L hand 39 seconds (R hand 27 sec.) 10th  visit 37 secs left 09/12/2020: Left 37 sec., Right 24 sec.    Time 12    Period Weeks    Status On-going    Target Date 12/05/20      OT LONG TERM GOAL #5   Title Pt. will improve left hand function to be able to independently, and efficiently separate multiple sheets of papers during bill management.    Baseline 09/12/2020:Pt. has difficulty performing.    Time 12    Period Weeks    Status New    Target Date 12/05/20      Long Term Additional Goals   Additional Long Term Goals Yes      OT LONG TERM GOAL #6   Title Pt. will independently type a one paragraph email correspondence accurately, and efficiently.    Baseline 09/12/2020: Pt has difficulty typing a one paragraph email correspondence efficiently.    Time 12    Period Weeks    Status New    Target Date 12/05/20                 Plan - 10/17/20 1116    Clinical Impression Statement Pt. continues to make steady progress with his LUE,  and continues to use his left hand to put dishes away, and pick up items off of the floor. Pt. continues to present with limited left hand coordination skills. Pt. Presented with more control with using the resistive tweezers over using the manual tweezers.  Pt. requires increased increased time to complete tasks.Pt. continues to work on improving LUE functioning in order to improve, and maximize overall independence with ADLs, and IADL tasks.   OT Occupational Profile and History Detailed Assessment- Review of Records and additional review of physical, cognitive, psychosocial history related to current functional performance    Occupational performance deficits (Please refer to evaluation for details): ADL's;IADL's    Body Structure / Function / Physical Skills ADL;Balance;FMC;GMC;Coordination;IADL;Strength;UE functional use    Rehab Potential Excellent    Clinical  Decision Making Limited treatment options, no task modification necessary    Comorbidities Affecting Occupational Performance: May have comorbidities impacting occupational performance  Modification or Assistance to Complete Evaluation  Min-Moderate modification of tasks or assist with assess necessary to complete eval    OT Frequency 2x / week    OT Duration 12 weeks    OT Treatment/Interventions Self-care/ADL training;Therapeutic exercise;Patient/family education;DME and/or AE instruction;Therapeutic activities;Neuromuscular education    Plan Therapeutic activities to enhance FMC/control, self-care re-training to modify as needed, neuromuscular education to enhance L side awareness as able, patient education to enhance safety and carryover outside of therapy, Therapeutic exercise to improve strength as it pertains to safe and efficient performance of ADLs.    Consulted and Agree with Plan of Care Patient           Patient will benefit from skilled therapeutic intervention in order to improve the following deficits and impairments:   Body Structure / Function / Physical Skills: ADL, Balance, FMC, GMC, Coordination, IADL, Strength, UE functional use       Visit Diagnosis: Muscle weakness (generalized)  Other lack of coordination    Problem List Patient Active Problem List   Diagnosis Date Noted   Malnutrition of moderate degree 06/25/2020   Fall    Palliative care by specialist    Subarachnoid hemorrhage (HCC) 06/23/2020   HYPERCHOLESTEROLEMIA 12/04/2008   RESTLESS LEGS SYNDROME 12/04/2008   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 SERVICES [address] [place], [place], [address] Phone: [phone]   Fax:  [phone]  Name: Edward Campbell MRN: [id number] Date of Birth: [birth year]

## 2020-10-22 ENCOUNTER — Ambulatory Visit: Payer: Medicare HMO | Admitting: Occupational Therapy

## 2020-10-22 ENCOUNTER — Ambulatory Visit: Payer: Medicare HMO

## 2020-10-22 ENCOUNTER — Other Ambulatory Visit: Payer: Self-pay

## 2020-10-22 ENCOUNTER — Encounter: Payer: Self-pay | Admitting: Occupational Therapy

## 2020-10-22 DIAGNOSIS — M6281 Muscle weakness (generalized): Secondary | ICD-10-CM | POA: Diagnosis not present

## 2020-10-22 DIAGNOSIS — R278 Other lack of coordination: Secondary | ICD-10-CM

## 2020-10-22 NOTE — Therapy (Signed)
[place] MAIN [facility] SERVICES [address] [place], [place], [address] Phone: [phone]   Fax:  [phone] Occupational Therapy Progress Note  Dates of reporting period  06/20/2020   to   10/22/2020  Patient Details  Name: Edward Campbell MRN: [id number] Date of Birth: Jul 11, 1941 Referring Provider (OT): Dr.  Johny Drilling   Encounter Date: 10/22/2020   OT End of Session - 10/22/20 1145    Visit Number 20    Number of Visits 24    Date for OT Re-Evaluation 12/05/20    Authorization Time Period Progress report period starting 06/20/2020    OT Start Time 1145    OT Stop Time 1230    OT Time Calculation (min) 45 min    Equipment Utilized During Treatment dynamometer, 9 hold peg test, pinch test.    Activity Tolerance Patient tolerated treatment well;Patient limited by pain    Behavior During Therapy [facility] for tasks assessed/performed           Past Medical History:  Diagnosis Date  . Stroke [facility])     Past Surgical History:  Procedure Laterality Date  . BACK SURGERY      There were no vitals filed for this visit.   Subjective Assessment - 10/22/20 1144    Subjective  Pt. reports  that his typing is improving    Pertinent History Pt is an 79 y/o M with PMH: CVA with acute L sided weakness, numbness and dysarthria on 4/11 and 4/28 requiring hospitalization. Other pertinent medical history: includes HTN, HLD, and DVT.    Limitations LUE strength, motor control, and [facility] skills.    Special Tests Vision: scanning in tact, peripheral vision in tact in all 4 quadrants, appropriate tracking and convergence.    Currently in Pain? No/denies              [facility] OT Assessment - 10/22/20 0001      Coordination   Left 9 Hole Peg Test 36       Strength   Overall Strength Comments LUE strength 5/5.      Hand Function   Right Hand Grip (lbs) 85    Right Hand Lateral Pinch 20 lbs    Right Hand 3 Point Pinch 23 lbs    Left Hand Grip (lbs)  60    Left Hand Lateral Pinch 17 lbs    Left 3 point pinch 20 lbs          Neuromuscular re ed:  Pt. worked on manipulating small 1/16" small objects with his left hand, and placed them onto an extra small dowel held in his left hand.  Measurements were obtained, and goals were reviewed with the pt. Pt. continues to make progress, and his now using his left hand more at home for reaching to place items onto shelves. Pt. is improving with typing emails. Pt. reports that he uses strategies, which have helped to make typing emails easier, and more efficient. Pt. continues to work on improving LUE hand strength, motor control, hand function, and [place] skills in order to work towards improving, and maximizing independence with ADLs, and IADLs.                 OT Education - 10/22/20 1145    Education Details [place], hand function, bi-manual coordination    Person(s) Educated Patient    Methods Explanation;Demonstration    Comprehension Verbalized understanding;Returned demonstration  OT Short Term Goals - 09/13/20 0858      OT SHORT TERM GOAL #1   Title Pt will be compliant with HEP with green theraputty to increase fine motor control and independence.    Baseline Pt is independent with Theraputty HEP.    Time 4    Period Weeks    Status Achieved    Target Date 07/18/20             OT Long Term Goals - 10/22/20 1206      OT LONG TERM GOAL #1   Title Pt will increase FOTO goal from 68 to 71 to improve performance of HH IADL tasks and overall QOL.    Baseline 10/22/2020: FOTO score 74. 7/14, FOTO score 68, 10th visit:  72    Time 12    Period Weeks    Status On-going    Target Date 11/08/20      OT LONG TERM GOAL #2   Title Pt. will improve left grip strength by 10# to be able to improve his ability to hold ADL objects  in his hand.    Baseline 10/22/2020: 60# 10.05/2020: 60#    Time 12    Period Weeks    Status On-going    Target Date 12/05/20       OT LONG TERM GOAL #3   Title Pt. will improve LUE motor control in order to independently reach up to place items on a shelf.    Baseline 10/12/2020: Pt. conitnues to be able to reach up to place multiple items at one time on a shelf with his left hand.    Time 12    Period Weeks    Status Partially Met    Target Date 12/05/20      OT LONG TERM GOAL #4   Title Pt. will improve Left hand [place] skills by 3 sec. to be able to manipulate small objects independently for ADLs.    Baseline 10/22/2020: 37 sec.7/14 L hand 39 seconds (R hand 27 sec.) 10th  visit 37 secs left 09/12/2020: Left 37 sec., Right 24 sec.    Time 12    Period Weeks    Status On-going    Target Date 12/05/20      OT LONG TERM GOAL #5   Title Pt. will improve left hand function to be able to independently, and efficiently separate multiple sheets of papers during bill management.    Baseline 09/12/2020:Pt. has difficulty performing.    Time 12    Period Weeks    Status Partially Met    Target Date 12/05/20      OT LONG TERM GOAL #6   Title Pt. will independently type a one paragraph email correspondence accurately, and efficiently.    Baseline 10/22/2020: Pt. is pregressing. 09/12/2020: Pt has difficulty typing a one paragraph email correspondence efficiently.    Time 12    Period Weeks    Status On-going    Target Date 12/05/20                 Plan - 10/22/20 1145    Clinical Impression Statement Measurements were obtained, and goals were reviewed with the pt. Pt. continues to make progress, and his now using his left hand more at home for reaching to place items onto shelves. Pt. is improving with typing emails. Pt. reports that he uses strategies, which have helped to make typing emails easier, and more efficient. Pt. continues to work on improving LUE  hand strength, motor control, hand function, and [place] skills in order to work towards improving, and maximizing independence with ADLs, and IADLs.   OT  Occupational Profile and History Detailed Assessment- Review of Records and additional review of physical, cognitive, psychosocial history related to current functional performance    Occupational performance deficits (Please refer to evaluation for details): ADL's;IADL's    Body Structure / Function / Physical Skills ADL;Balance;FMC;GMC;Coordination;IADL;Strength;UE functional use    Rehab Potential Excellent    Comorbidities Affecting Occupational Performance: May have comorbidities impacting occupational performance    Modification or Assistance to Complete Evaluation  Min-Moderate modification of tasks or assist with assess necessary to complete eval    OT Frequency 2x / week    OT Duration 12 weeks    OT Treatment/Interventions Self-care/ADL training;Therapeutic exercise;Patient/family education;DME and/or AE instruction;Therapeutic activities;Neuromuscular education    Plan Therapeutic activities to enhance FMC/control, self-care re-training to modify as needed, neuromuscular education to enhance L side awareness as able, patient education to enhance safety and carryover outside of therapy, Therapeutic exercise to improve strength as it pertains to safe and efficient performance of ADLs.    OT Home Exercise Plan Use green putty to increase digit flex/ext strength, stretching wrist/digits and rest breaks between exercise to prevent cramping    Consulted and Agree with Plan of Care Patient           Patient will benefit from skilled therapeutic intervention in order to improve the following deficits and impairments:   Body Structure / Function / Physical Skills: ADL, Balance, FMC, GMC, Coordination, IADL, Strength, UE functional use       Visit Diagnosis: Muscle weakness (generalized)  Other lack of coordination    Problem List Patient Active Problem List   Diagnosis Date Noted  . Malnutrition of moderate degree 06/25/2020  . Fall   . Palliative care by specialist   .  Subarachnoid hemorrhage ([place]) 06/23/2020  . HYPERCHOLESTEROLEMIA 12/04/2008  . 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 SUPERFICIAL PHLEBITIS 12/04/2008  . CHRONIC PROSTATITIS 12/04/2008  . PROSTATE SPECIFIC ANTIGEN, ELEVATED 12/04/2008    Harrel Carina, MS, OTR/L 10/22/2020, 12:20 PM  [place] MAIN [facility] SERVICES [address] [place], [place], [address] Phone: [phone]   Fax:  [phone]  Name: Edward Campbell MRN: [id number] Date of Birth: Aug 16, 1941

## 2020-10-24 ENCOUNTER — Other Ambulatory Visit: Payer: Self-pay

## 2020-10-24 ENCOUNTER — Ambulatory Visit: Payer: Medicare HMO | Admitting: Occupational Therapy

## 2020-10-24 ENCOUNTER — Encounter: Payer: Self-pay | Admitting: Occupational Therapy

## 2020-10-24 ENCOUNTER — Ambulatory Visit: Payer: Medicare HMO

## 2020-10-24 DIAGNOSIS — R278 Other lack of coordination: Secondary | ICD-10-CM

## 2020-10-24 DIAGNOSIS — M6281 Muscle weakness (generalized): Secondary | ICD-10-CM | POA: Diagnosis not present

## 2020-10-24 NOTE — Therapy (Signed)
[place] MAIN [facility] SERVICES [address] [place], [place], [address] Phone: [phone]   Fax:  [phone]  Occupational Therapy Treatment  Patient Details  Name: Edward Campbell MRN: [id number] Date of Birth: [birth year] Referring Provider (OT): Dr.  Johny Drilling   Encounter Date: 10/24/2020   OT End of Session - 10/24/20 1114    Visit Number 21    Number of Visits 24    Date for OT Re-Evaluation 12/05/20    Authorization Type FOTO    Authorization Time Period Progress report period starting 06/20/2020    OT Start Time 1110    OT Stop Time 1155    OT Time Calculation (min) 45 min    Activity Tolerance Patient tolerated treatment well;Patient limited by pain    Behavior During Therapy [facility] for tasks assessed/performed           Past Medical History:  Diagnosis Date  . Stroke [facility])     Past Surgical History:  Procedure Laterality Date  . BACK SURGERY      There were no vitals filed for this visit.   Subjective Assessment - 10/24/20 1114    Subjective  Pt. reports  that his typing is improving    Pertinent History Pt is an 79 y/o M with PMH: CVA with acute L sided weakness, numbness and dysarthria on 4/11 and 4/28 requiring hospitalization. Other pertinent medical history: includes HTN, HLD, and DVT.    Special Tests Vision: scanning in tact, peripheral vision in tact in all 4 quadrants, appropriate tracking and convergence.    Patient Stated Goals To improve coordination and ultimate goal is to drive again    Currently in Pain? No/denies          Therapeutic Ex.  Pt. worked on green thearputty ex. For hand strengthening. Exercises included: gross gripping, gross digit extension, thumb abduction, lateral, and 3pt. Pinch strengthening, digit abduction, and thumb opposition. Pt. was provided with a visual handout HEP through [employer]. An access code was provided.  Pt. required verbal cues, and cues for visual demonstration to  perform each of the green theraputty exercises. Pt. has continued to make progress overall. Pt. continues to work on improving LUE strength, and [facility] skills in order to work towards improving LUE functioning, and maximizing independence with ADLs, and IADLs.                     OT Education - 10/24/20 1114    Education Details [place], hand function, bi-manual coordination    Person(s) Educated Patient    Methods Explanation;Demonstration    Comprehension Verbalized understanding;Returned demonstration            OT Short Term Goals - 09/13/20 0858      OT SHORT TERM GOAL #1   Title Pt will be compliant with HEP with green theraputty to increase fine motor control and independence.    Baseline Pt is independent with Theraputty HEP.    Time 4    Period Weeks    Status Achieved    Target Date 07/18/20             OT Long Term Goals - 10/22/20 1206      OT LONG TERM GOAL #1   Title Pt will increase FOTO goal from 68 to 71 to improve performance of HH IADL tasks and overall QOL.    Baseline 10/22/2020: FOTO score 74. 7/14, FOTO score 68, 10th visit:  72  Time 12    Period Weeks    Status On-going    Target Date 11/08/20      OT LONG TERM GOAL #2   Title Pt. will improve left grip strength by 10# to be able to improve his ability to hold ADL objects  in his hand.    Baseline 10/22/2020: 60# 10.05/2020: 60#    Time 12    Period Weeks    Status On-going    Target Date 12/05/20      OT LONG TERM GOAL #3   Title Pt. will improve LUE motor control in order to independently reach up to place items on a shelf.    Baseline 10/12/2020: Pt. conitnues to be able to reach up to place multiple items at one time on a shelf with his left hand.    Time 12    Period Weeks    Status Partially Met    Target Date 12/05/20      OT LONG TERM GOAL #4   Title Pt. will improve Left hand [place] skills by 3 sec. to be able to manipulate small objects independently for ADLs.     Baseline 10/22/2020: 37 sec.7/14 L hand 39 seconds (R hand 27 sec.) 10th  visit 37 secs left 09/12/2020: Left 37 sec., Right 24 sec.    Time 12    Period Weeks    Status On-going    Target Date 12/05/20      OT LONG TERM GOAL #5   Title Pt. will improve left hand function to be able to independently, and efficiently separate multiple sheets of papers during bill management.    Baseline 09/12/2020:Pt. has difficulty performing.    Time 12    Period Weeks    Status Partially Met    Target Date 12/05/20      OT LONG TERM GOAL #6   Title Pt. will independently type a one paragraph email correspondence accurately, and efficiently.    Baseline 10/22/2020: Pt. is pregressing. 09/12/2020: Pt has difficulty typing a one paragraph email correspondence efficiently.    Time 12    Period Weeks    Status On-going    Target Date 12/05/20                 Plan - 10/24/20 1115    Clinical Impression Statement Pt. required verbal cues, and cues for visual demonstration to perform each of the green theraputty exercises. Pt. has continued to make progress overall. Pt. continues to work on improving LUE strength, and [facility] skills in order to work towards improving LUE functioning, and maximizing independence with ADLs, and IADLs.    OT Occupational Profile and History Detailed Assessment- Review of Records and additional review of physical, cognitive, psychosocial history related to current functional performance    Occupational performance deficits (Please refer to evaluation for details): ADL's;IADL's    Body Structure / Function / Physical Skills ADL;Balance;FMC;GMC;Coordination;IADL;Strength;UE functional use    Rehab Potential Excellent    Clinical Decision Making Limited treatment options, no task modification necessary    Comorbidities Affecting Occupational Performance: May have comorbidities impacting occupational performance    Modification or Assistance to Complete Evaluation  Min-Moderate  modification of tasks or assist with assess necessary to complete eval    OT Frequency 2x / week    OT Duration 12 weeks    OT Treatment/Interventions Self-care/ADL training;Therapeutic exercise;Patient/family education;DME and/or AE instruction;Therapeutic activities;Neuromuscular education    Plan Therapeutic activities to enhance FMC/control, self-care re-training to modify  as needed, neuromuscular education to enhance L side awareness as able, patient education to enhance safety and carryover outside of therapy, Therapeutic exercise to improve strength as it pertains to safe and efficient performance of ADLs.    Consulted and Agree with Plan of Care Patient           Patient will benefit from skilled therapeutic intervention in order to improve the following deficits and impairments:   Body Structure / Function / Physical Skills: ADL, Balance, FMC, GMC, Coordination, IADL, Strength, UE functional use       Visit Diagnosis: Muscle weakness (generalized)  Other lack of coordination    Problem List Patient Active Problem List   Diagnosis Date Noted  . Malnutrition of moderate degree 06/25/2020  . Fall   . Palliative care by specialist   . Subarachnoid hemorrhage ([place]) 06/23/2020  . HYPERCHOLESTEROLEMIA 12/04/2008  . 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 SUPERFICIAL PHLEBITIS 12/04/2008  . CHRONIC PROSTATITIS 12/04/2008  . PROSTATE SPECIFIC ANTIGEN, ELEVATED 12/04/2008    Harrel Carina, MS, OTR/L 10/24/2020, 11:16 AM  [place] MAIN [facility] SERVICES [address] [place], [place], [address] Phone: [phone]   Fax:  [phone]  Name: Edward Campbell MRN: [id number] Date of Birth: 1941-10-30

## 2020-10-29 ENCOUNTER — Ambulatory Visit: Payer: Medicare HMO

## 2020-10-29 ENCOUNTER — Other Ambulatory Visit: Payer: Self-pay

## 2020-10-29 ENCOUNTER — Encounter: Payer: Self-pay | Admitting: Occupational Therapy

## 2020-10-29 ENCOUNTER — Ambulatory Visit: Payer: Medicare HMO | Admitting: Occupational Therapy

## 2020-10-29 DIAGNOSIS — M6281 Muscle weakness (generalized): Secondary | ICD-10-CM | POA: Diagnosis not present

## 2020-10-29 DIAGNOSIS — R278 Other lack of coordination: Secondary | ICD-10-CM

## 2020-10-29 NOTE — Therapy (Signed)
[place] MAIN [facility] SERVICES [address] [place], [place], [address] Phone: [phone]   Fax:  [phone]  Occupational Therapy Treatment  Patient Details  Name: Edward Campbell MRN: [id number] Date of Birth: 1941/11/30 Referring Provider (OT): Dr.  Johny Drilling   Encounter Date: 10/29/2020   OT End of Session - 10/29/20 1204    Visit Number 22    Number of Visits 24    Date for OT Re-Evaluation 12/05/20    OT Start Time 1145    OT Stop Time 1230    OT Time Calculation (min) 45 min    Activity Tolerance Patient tolerated treatment well;Patient limited by pain    Behavior During Therapy [facility] for tasks assessed/performed           Past Medical History:  Diagnosis Date  . Stroke [facility])     Past Surgical History:  Procedure Laterality Date  . BACK SURGERY      There were no vitals filed for this visit.   Subjective Assessment - 10/29/20 1203    Subjective  Pt. reports  that his typing is improving    Pertinent History Pt is an 79 y/o M with PMH: CVA with acute L sided weakness, numbness and dysarthria on 4/11 and 4/28 requiring hospitalization. Other pertinent medical history: includes HTN, HLD, and DVT.    Limitations LUE strength, motor control, and [facility] skills.    Special Tests Vision: scanning in tact, peripheral vision in tact in all 4 quadrants, appropriate tracking and convergence.    Currently in Pain? No/denies           OT TREATMENT    Neuro muscular re-education:  Pt. worked on left hand [facility] skills grasping 1/2" pegs, storing them in the ulnar aspect of his hand, and moving them from his palm to the tip of his 2nd digit, and thumb to place them into a pegboard. Pt. worked on grasping 1/2" pegs, and dropping them one at a time form his palm.  Pt. reports that things went smoother at home this morning, and was able use transportation services. Pt. is making steady progress, however dropped several pegs when when  performing translatory movements storing, and moving the pegs from his palm to the tip of his 2nd digit while preparing to place the pegs into the pegboard. Pt. continues to work on improving left hand function, and [facility] skills in order to work towards improving, and maximizing independence with ADLs, and IADL tasks.                        OT Education - 10/29/20 1204    Education Details [place], hand function, bi-manual coordination    Person(s) Educated Patient    Methods Explanation;Demonstration    Comprehension Verbalized understanding;Returned demonstration            OT Short Term Goals - 09/13/20 0858      OT SHORT TERM GOAL #1   Title Pt will be compliant with HEP with green theraputty to increase fine motor control and independence.    Baseline Pt is independent with Theraputty HEP.    Time 4    Period Weeks    Status Achieved    Target Date 07/18/20             OT Long Term Goals - 10/22/20 1206      OT LONG TERM GOAL #1   Title Pt will increase FOTO goal  from 68 to 71 to improve performance of HH IADL tasks and overall QOL.    Baseline 10/22/2020: FOTO score 74. 7/14, FOTO score 68, 10th visit:  72    Time 12    Period Weeks    Status On-going    Target Date 11/08/20      OT LONG TERM GOAL #2   Title Pt. will improve left grip strength by 10# to be able to improve his ability to hold ADL objects  in his hand.    Baseline 10/22/2020: 60# 10.05/2020: 60#    Time 12    Period Weeks    Status On-going    Target Date 12/05/20      OT LONG TERM GOAL #3   Title Pt. will improve LUE motor control in order to independently reach up to place items on a shelf.    Baseline 10/12/2020: Pt. conitnues to be able to reach up to place multiple items at one time on a shelf with his left hand.    Time 12    Period Weeks    Status Partially Met    Target Date 12/05/20      OT LONG TERM GOAL #4   Title Pt. will improve Left hand [place] skills by 3 sec. to  be able to manipulate small objects independently for ADLs.    Baseline 10/22/2020: 37 sec.7/14 L hand 39 seconds (R hand 27 sec.) 10th  visit 37 secs left 09/12/2020: Left 37 sec., Right 24 sec.    Time 12    Period Weeks    Status On-going    Target Date 12/05/20      OT LONG TERM GOAL #5   Title Pt. will improve left hand function to be able to independently, and efficiently separate multiple sheets of papers during bill management.    Baseline 09/12/2020:Pt. has difficulty performing.    Time 12    Period Weeks    Status Partially Met    Target Date 12/05/20      OT LONG TERM GOAL #6   Title Pt. will independently type a one paragraph email correspondence accurately, and efficiently.    Baseline 10/22/2020: Pt. is pregressing. 09/12/2020: Pt has difficulty typing a one paragraph email correspondence efficiently.    Time 12    Period Weeks    Status On-going    Target Date 12/05/20                 Plan - 10/29/20 1206    Clinical Impression Statement Pt. reports that things went smoother at home this morning, and was able use transportation services. Pt. is making steady progress, however dropped several pegs when when performing translatory movements storing, and moving the pegs from his palm to the tip of his 2nd digit while preparing to place the pegs into the pegboard. Pt. continues to work on improving left hand function, and [facility] skills in order to work towards improving, and maximizing independence with ADLs, and IADL tasks.   OT Occupational Profile and History Detailed Assessment- Review of Records and additional review of physical, cognitive, psychosocial history related to current functional performance    Occupational performance deficits (Please refer to evaluation for details): ADL's;IADL's    Body Structure / Function / Physical Skills ADL;Balance;FMC;GMC;Coordination;IADL;Strength;UE functional use    Rehab Potential Excellent    Clinical Decision Making  Limited treatment options, no task modification necessary    Comorbidities Affecting Occupational Performance: May have comorbidities impacting occupational performance  Modification or Assistance to Complete Evaluation  Min-Moderate modification of tasks or assist with assess necessary to complete eval    OT Frequency 2x / week    OT Duration 12 weeks    OT Treatment/Interventions Self-care/ADL training;Therapeutic exercise;Patient/family education;DME and/or AE instruction;Therapeutic activities;Neuromuscular education    Plan Therapeutic activities to enhance FMC/control, self-care re-training to modify as needed, neuromuscular education to enhance L side awareness as able, patient education to enhance safety and carryover outside of therapy, Therapeutic exercise to improve strength as it pertains to safe and efficient performance of ADLs.    Consulted and Agree with Plan of Care Patient           Patient will benefit from skilled therapeutic intervention in order to improve the following deficits and impairments:   Body Structure / Function / Physical Skills: ADL, Balance, FMC, GMC, Coordination, IADL, Strength, UE functional use       Visit Diagnosis: Muscle weakness (generalized)  Other lack of coordination    Problem List Patient Active Problem List   Diagnosis Date Noted  . Malnutrition of moderate degree 06/25/2020  . Fall   . Palliative care by specialist   . Subarachnoid hemorrhage ([place]) 06/23/2020  . HYPERCHOLESTEROLEMIA 12/04/2008  . 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 SUPERFICIAL PHLEBITIS 12/04/2008  . CHRONIC PROSTATITIS 12/04/2008  . PROSTATE SPECIFIC ANTIGEN, ELEVATED 12/04/2008    Harrel Carina, MS, OTR/L 10/29/2020, 12:17 PM  [place] MAIN [facility] SERVICES [address] [place], [place], [address] Phone: [phone]   Fax:  [phone]  Name: Edward Campbell MRN:  [id number] Date of Birth: [birth year]

## 2020-10-31 ENCOUNTER — Ambulatory Visit: Payer: Medicare HMO | Admitting: Occupational Therapy

## 2020-10-31 ENCOUNTER — Ambulatory Visit: Payer: Medicare HMO

## 2020-10-31 ENCOUNTER — Encounter: Payer: Self-pay | Admitting: Occupational Therapy

## 2020-10-31 ENCOUNTER — Other Ambulatory Visit: Payer: Self-pay

## 2020-10-31 DIAGNOSIS — M6281 Muscle weakness (generalized): Secondary | ICD-10-CM | POA: Diagnosis not present

## 2020-10-31 DIAGNOSIS — R278 Other lack of coordination: Secondary | ICD-10-CM

## 2020-10-31 NOTE — Therapy (Signed)
[place] MAIN [facility] SERVICES [address] [place], [place], [address] Phone: [phone]   Fax:  [phone]  Occupational Therapy Treatment  Patient Details  Name: Edward Campbell MRN: [id number] Date of Birth: [birth year] Referring Provider (OT): Dr.  Johny Drilling   Encounter Date: 10/31/2020   OT End of Session - 10/31/20 1158    Visit Number 23    Number of Visits 48    Date for OT Re-Evaluation 12/05/20    Authorization Type FOTO    Authorization Time Period Progress report period starting 06/20/2020    OT Start Time 1145    OT Stop Time 1230    OT Time Calculation (min) 45 min    Activity Tolerance Patient tolerated treatment well;Patient limited by pain    Behavior During Therapy [facility] for tasks assessed/performed           Past Medical History:  Diagnosis Date  . Stroke [facility])     Past Surgical History:  Procedure Laterality Date  . BACK SURGERY      There were no vitals filed for this visit.   Subjective Assessment - 10/31/20 1158    Subjective  Pt. reports that he is doing well today.    Pertinent History Pt is an 79 y/o M with PMH: CVA with acute L sided weakness, numbness and dysarthria on 4/11 and 4/28 requiring hospitalization. Other pertinent medical history: includes HTN, HLD, and DVT.    Special Tests Vision: scanning in tact, peripheral vision in tact in all 4 quadrants, appropriate tracking and convergence.    Currently in Pain? No/denies          OT TREATMENT    Neuro muscular re-education:  Pt. worked on tasks to sustain left lateral pinch on resistive tweezers while grasping and moving 2" toothpick sticks from a horizontal flat position to a vertical position in order to place it in the holder. Pt. was able to sustain grasp while positioning and extending the wrist/hand in the necessary alignment needed to place the stick through the top of the holder. Pt. worked on [facility] skills using the [employer] Task. Pt. worked on sustaining grasp on the resistive tweezers while grasping this sticks, and moving them from a horizontal position to a vertical position to prepare for placing them into the pegboard. Pt. required verbal cues, and cues for visual demonstration for wrist position, and hand pattern when placing them into the pegboard.  Pt. continues to make progress overall. Pt. reports that he tried to use clippers to trim his finger nails especially on his left hand.  Pt. was able to sustain his hold on the thicker tweezers while extending his wrist when manipulating the toothpicks. Pt. presented with more difficulty sustaining the resistive grasp on the tweezers while holding the 1" sticks causing multiple sticks to slide through with turning his hand to place them onto the pegboard. Pt. requires verbal cues to avoid compensating proximally with the LUE, and hand. Pt. Continues to work on improving UE strength, and [facility] skills in order to work towards improving, and maximizing independence with clipping nails, and performing ADLs, and IADLs.                          OT Education - 10/31/20 1158    Education Details [place], hand function, bi-manual coordination    Person(s) Educated Patient    Methods Explanation;Demonstration    Comprehension Verbalized understanding;Returned  demonstration            OT Short Term Goals - 09/13/20 0858      OT SHORT TERM GOAL #1   Title Pt will be compliant with HEP with green theraputty to increase fine motor control and independence.    Baseline Pt is independent with Theraputty HEP.    Time 4    Period Weeks    Status Achieved    Target Date 07/18/20             OT Long Term Goals - 10/22/20 1206      OT LONG TERM GOAL #1   Title Pt will increase FOTO goal from 68 to 71 to improve performance of HH IADL tasks and overall QOL.    Baseline 10/22/2020: FOTO score 74. 7/14, FOTO score 68, 10th visit:  72    Time 12     Period Weeks    Status On-going    Target Date 11/08/20      OT LONG TERM GOAL #2   Title Pt. will improve left grip strength by 10# to be able to improve his ability to hold ADL objects  in his hand.    Baseline 10/22/2020: 60# 10.05/2020: 60#    Time 12    Period Weeks    Status On-going    Target Date 12/05/20      OT LONG TERM GOAL #3   Title Pt. will improve LUE motor control in order to independently reach up to place items on a shelf.    Baseline 10/12/2020: Pt. conitnues to be able to reach up to place multiple items at one time on a shelf with his left hand.    Time 12    Period Weeks    Status Partially Met    Target Date 12/05/20      OT LONG TERM GOAL #4   Title Pt. will improve Left hand [place] skills by 3 sec. to be able to manipulate small objects independently for ADLs.    Baseline 10/22/2020: 37 sec.7/14 L hand 39 seconds (R hand 27 sec.) 10th  visit 37 secs left 09/12/2020: Left 37 sec., Right 24 sec.    Time 12    Period Weeks    Status On-going    Target Date 12/05/20      OT LONG TERM GOAL #5   Title Pt. will improve left hand function to be able to independently, and efficiently separate multiple sheets of papers during bill management.    Baseline 09/12/2020:Pt. has difficulty performing.    Time 12    Period Weeks    Status Partially Met    Target Date 12/05/20      OT LONG TERM GOAL #6   Title Pt. will independently type a one paragraph email correspondence accurately, and efficiently.    Baseline 10/22/2020: Pt. is pregressing. 09/12/2020: Pt has difficulty typing a one paragraph email correspondence efficiently.    Time 12    Period Weeks    Status On-going    Target Date 12/05/20                 Plan - 10/31/20 1159    Clinical Impression Statement Pt. continues to make progress overall. Pt. reports that he tried to use clippers to trim his finger nails especially on his left hand.  Pt. was able to sustain his hold on the thicker tweezers  while extending his wrist when manipulating the toothpicks. Pt. presented with more difficulty  sustaining the resistive grasp on the tweezers while holding the 1" sticks causing multiple sticks to slide through with turning his hand to place them onto the pegboard. Pt. requires verbal cues to avoid compensating proximally with the LUE, and hand. Pt. Continues to work on improving UE strength, and [facility] skills in order to work towards improving, and maximizing independence with clipping nails, and performing ADLs, and IADLs.     OT Occupational Profile and History Detailed Assessment- Review of Records and additional review of physical, cognitive, psychosocial history related to current functional performance    Occupational performance deficits (Please refer to evaluation for details): ADL's;IADL's    Body Structure / Function / Physical Skills ADL;Balance;FMC;GMC;Coordination;IADL;Strength;UE functional use    Rehab Potential Excellent    Clinical Decision Making Limited treatment options, no task modification necessary    Comorbidities Affecting Occupational Performance: May have comorbidities impacting occupational performance    Modification or Assistance to Complete Evaluation  Min-Moderate modification of tasks or assist with assess necessary to complete eval    OT Frequency 2x / week    OT Duration 12 weeks    OT Treatment/Interventions Self-care/ADL training;Therapeutic exercise;Patient/family education;DME and/or AE instruction;Therapeutic activities;Neuromuscular education    Plan Therapeutic activities to enhance FMC/control, self-care re-training to modify as needed, neuromuscular education to enhance L side awareness as able, patient education to enhance safety and carryover outside of therapy, Therapeutic exercise to improve strength as it pertains to safe and efficient performance of ADLs.    Consulted and Agree with Plan of Care Patient           Patient will benefit from skilled  therapeutic intervention in order to improve the following deficits and impairments:   Body Structure / Function / Physical Skills: ADL, Balance, FMC, GMC, Coordination, IADL, Strength, UE functional use       Visit Diagnosis: Muscle weakness (generalized)  Other lack of coordination    Problem List Patient Active Problem List   Diagnosis Date Noted  . Malnutrition of moderate degree 06/25/2020  . Fall   . Palliative care by specialist   . Subarachnoid hemorrhage ([place]) 06/23/2020  . HYPERCHOLESTEROLEMIA 12/04/2008  . 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 SUPERFICIAL PHLEBITIS 12/04/2008  . CHRONIC PROSTATITIS 12/04/2008  . PROSTATE SPECIFIC ANTIGEN, ELEVATED 12/04/2008    Harrel Carina, MS, OTR/L 10/31/2020, 12:01 PM  [place] MAIN [facility] SERVICES [address] [place], [place], [address] Phone: [phone]   Fax:  [phone]  Name: MUAD NOGA MRN: [id number] Date of Birth: [birth year]

## 2020-11-05 ENCOUNTER — Encounter: Payer: Self-pay | Admitting: Occupational Therapy

## 2020-11-05 ENCOUNTER — Other Ambulatory Visit: Payer: Self-pay

## 2020-11-05 ENCOUNTER — Ambulatory Visit: Payer: Medicare HMO

## 2020-11-05 ENCOUNTER — Ambulatory Visit: Payer: Medicare HMO | Admitting: Occupational Therapy

## 2020-11-05 DIAGNOSIS — M6281 Muscle weakness (generalized): Secondary | ICD-10-CM | POA: Diagnosis not present

## 2020-11-05 DIAGNOSIS — R278 Other lack of coordination: Secondary | ICD-10-CM

## 2020-11-05 NOTE — Therapy (Signed)
[place] MAIN [facility] SERVICES [address] [place], [place], [address] Phone: [phone]   Fax:  [phone]  Occupational Therapy Treatment  Patient Details  Name: Edward Campbell MRN: [id number] Date of Birth: [birth year] Referring Provider (OT): Dr.  Johny Drilling   Encounter Date: 11/05/2020   OT End of Session - 11/05/20 1152    Visit Number 24    Number of Visits 48    Date for OT Re-Evaluation 12/05/20    Authorization Time Period Progress report period starting 06/20/2020    OT Start Time 1145    OT Stop Time 1230    OT Time Calculation (min) 45 min    Activity Tolerance Patient tolerated treatment well;Patient limited by pain    Behavior During Therapy [facility] for tasks assessed/performed           Past Medical History:  Diagnosis Date  . Stroke [facility])     Past Surgical History:  Procedure Laterality Date  . BACK SURGERY      There were no vitals filed for this visit.   Subjective Assessment - 11/05/20 1151    Subjective  Pt. reports that he is doing well today.    Pertinent History Pt is an 79 y/o M with PMH: CVA with acute L sided weakness, numbness and dysarthria on 4/11 and 4/28 requiring hospitalization. Other pertinent medical history: includes HTN, HLD, and DVT.    Limitations LUE strength, motor control, and [facility] skills.    Special Tests Vision: scanning in tact, peripheral vision in tact in all 4 quadrants, appropriate tracking and convergence.    Currently in Pain? No/denies          OT TREATMENT   Neuro muscular re-education:  Pt. worked on [employer] skills using the [employer] Task. Pt. worked on sustaining grasp on the resistive tweezers while grasping this sticks, and moving them from a horizontal position to a vertical position to prepare for placing them into the pegboard. Pt.required verbal cues, and cues for visual demonstration for wrist position, and hand pattern when placing them into the  pegboard. Pt. performed [facility] tasks using the Grooved pegboard. Pt. worked on grasping the grooved pegs from a horizontal position, and moving the pegs to a vertical position in the hand to prepare for placing them in the grooved slot.   Pt. Reports that he loses the motor control in his left hand after using the theraputty for an extended time at home. Pt. continues to make steady progress, and his now having an easier time putting dishes away, and pick up items off of the floor. Pt. continues to present with limited coordination. Pt. was able to manipulate the resistive tweezers while grasping the thin pegs form a horizontal position while moving them to a vertical position in preparation for placing them into a pegboard. Pt. has less difficulty manipulating the tweezers when grasping the 1" thin sticks. Pt. requires increased time to complete.Pt. required verbal cues, and visual demonstration for hand placement on the tweezers when removing the pegs.Pt. continues to work on improving LUE functioning in order to improve, and maximize overall independence with ADLs, and IADL tasks.                          OT Education - 11/05/20 1152    Education Details [place], hand function, bi-manual coordination    Person(s) Educated Patient    Methods Explanation;Demonstration    Comprehension  Verbalized understanding;Returned demonstration            OT Short Term Goals - 09/13/20 0858      OT SHORT TERM GOAL #1   Title Pt will be compliant with HEP with green theraputty to increase fine motor control and independence.    Baseline Pt is independent with Theraputty HEP.    Time 4    Period Weeks    Status Achieved    Target Date 07/18/20             OT Long Term Goals - 10/22/20 1206      OT LONG TERM GOAL #1   Title Pt will increase FOTO goal from 68 to 71 to improve performance of HH IADL tasks and overall QOL.    Baseline 10/22/2020: FOTO score 74. 7/14, FOTO score  68, 10th visit:  72    Time 12    Period Weeks    Status On-going    Target Date 11/08/20      OT LONG TERM GOAL #2   Title Pt. will improve left grip strength by 10# to be able to improve his ability to hold ADL objects  in his hand.    Baseline 10/22/2020: 60# 10.05/2020: 60#    Time 12    Period Weeks    Status On-going    Target Date 12/05/20      OT LONG TERM GOAL #3   Title Pt. will improve LUE motor control in order to independently reach up to place items on a shelf.    Baseline 10/12/2020: Pt. conitnues to be able to reach up to place multiple items at one time on a shelf with his left hand.    Time 12    Period Weeks    Status Partially Met    Target Date 12/05/20      OT LONG TERM GOAL #4   Title Pt. will improve Left hand [place] skills by 3 sec. to be able to manipulate small objects independently for ADLs.    Baseline 10/22/2020: 37 sec.7/14 L hand 39 seconds (R hand 27 sec.) 10th  visit 37 secs left 09/12/2020: Left 37 sec., Right 24 sec.    Time 12    Period Weeks    Status On-going    Target Date 12/05/20      OT LONG TERM GOAL #5   Title Pt. will improve left hand function to be able to independently, and efficiently separate multiple sheets of papers during bill management.    Baseline 09/12/2020:Pt. has difficulty performing.    Time 12    Period Weeks    Status Partially Met    Target Date 12/05/20      OT LONG TERM GOAL #6   Title Pt. will independently type a one paragraph email correspondence accurately, and efficiently.    Baseline 10/22/2020: Pt. is pregressing. 09/12/2020: Pt has difficulty typing a one paragraph email correspondence efficiently.    Time 12    Period Weeks    Status On-going    Target Date 12/05/20                 Plan - 11/05/20 1153    Clinical Impression Statement Pt. Reports that he loses the motor control in his left hand after using the theraputty for an extended time at home. Pt. continues to make steady progress,  and his now having an easier time putting dishes away, and pick up items off of the floor. Pt.  continues to present with limited coordination. Pt. was able to manipulate the resistive tweezers while grasping the thin pegs form a horizontal position while moving them to a vertical position in preparation for placing them into a pegboard. Pt. has less difficulty manipulating the tweezers when grasping the 1" thin sticks. Pt. requires increased time to complete.Pt. required verbal cues, and visual demonstration for hand placement on the tweezers when removing the pegs.Pt. continues to work on improving LUE functioning in order to improve, and maximize overall independence with ADLs, and IADL tasks.     OT Occupational Profile and History Detailed Assessment- Review of Records and additional review of physical, cognitive, psychosocial history related to current functional performance    Occupational performance deficits (Please refer to evaluation for details): ADL's;IADL's    Body Structure / Function / Physical Skills ADL;Balance;FMC;GMC;Coordination;IADL;Strength;UE functional use    Rehab Potential Excellent    Clinical Decision Making Limited treatment options, no task modification necessary    Comorbidities Affecting Occupational Performance: May have comorbidities impacting occupational performance    Modification or Assistance to Complete Evaluation  Min-Moderate modification of tasks or assist with assess necessary to complete eval    OT Frequency 2x / week    OT Duration 12 weeks    OT Treatment/Interventions Self-care/ADL training;Therapeutic exercise;Patient/family education;DME and/or AE instruction;Therapeutic activities;Neuromuscular education    Consulted and Agree with Plan of Care Patient           Patient will benefit from skilled therapeutic intervention in order to improve the following deficits and impairments:   Body Structure / Function / Physical Skills: ADL, Balance, FMC,  GMC, Coordination, IADL, Strength, UE functional use       Visit Diagnosis: Muscle weakness (generalized)  Other lack of coordination    Problem List Patient Active Problem List   Diagnosis Date Noted  . Malnutrition of moderate degree 06/25/2020  . Fall   . Palliative care by specialist   . Subarachnoid hemorrhage ([place]) 06/23/2020  . HYPERCHOLESTEROLEMIA 12/04/2008  . 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 SUPERFICIAL PHLEBITIS 12/04/2008  . CHRONIC PROSTATITIS 12/04/2008  . PROSTATE SPECIFIC ANTIGEN, ELEVATED 12/04/2008    Harrel Carina, MS, OTR/L 11/05/2020, 11:55 AM  [place] MAIN [facility] SERVICES [address] [place], [place], [address] Phone: [phone]   Fax:  [phone]  Name: AITAN ROSSBACH MRN: [id number] Date of Birth: 09-09-1941

## 2020-11-07 ENCOUNTER — Other Ambulatory Visit: Payer: Self-pay

## 2020-11-07 ENCOUNTER — Encounter: Payer: Self-pay | Admitting: Occupational Therapy

## 2020-11-07 ENCOUNTER — Ambulatory Visit: Payer: Medicare HMO

## 2020-11-07 ENCOUNTER — Ambulatory Visit: Payer: Medicare HMO | Attending: Family Medicine | Admitting: Occupational Therapy

## 2020-11-07 DIAGNOSIS — M6281 Muscle weakness (generalized): Secondary | ICD-10-CM | POA: Diagnosis not present

## 2020-11-07 DIAGNOSIS — R278 Other lack of coordination: Secondary | ICD-10-CM | POA: Insufficient documentation

## 2020-11-07 NOTE — Therapy (Signed)
[place] MAIN [facility] SERVICES [address] [place], [place], [address] Phone: [phone]   Fax:  [phone]  Occupational Therapy Treatment  Patient Details  Name: Edward Campbell MRN: [id number] Date of Birth: October 26, 1941 Referring Provider (OT): Dr.  Johny Drilling   Encounter Date: 11/07/2020   OT End of Session - 11/07/20 1204    Visit Number 25    Number of Visits 48    Date for OT Re-Evaluation 12/05/20    Authorization Type FOTO    Authorization Time Period Progress report period starting 06/20/2020    OT Start Time 1145    OT Stop Time 1230    OT Time Calculation (min) 45 min    Activity Tolerance Patient tolerated treatment well;Patient limited by pain    Behavior During Therapy [facility] for tasks assessed/performed           Past Medical History:  Diagnosis Date  . Stroke [facility])     Past Surgical History:  Procedure Laterality Date  . BACK SURGERY      There were no vitals filed for this visit.   Subjective Assessment - 11/07/20 1202    Subjective  Pt. reports that he is doing well today.    Pertinent History Pt is an 79 y/o M with PMH: CVA with acute L sided weakness, numbness and dysarthria on 4/11 and 4/28 requiring hospitalization. Other pertinent medical history: includes HTN, HLD, and DVT.    Limitations LUE strength, motor control, and [facility] skills.    Special Tests Vision: scanning in tact, peripheral vision in tact in all 4 quadrants, appropriate tracking and convergence.    Currently in Pain? No/denies          OT TREATMENT    Neuro muscular re-education:  Pt. worked on left hand [facility] skills grasping 1" perfection shapes, and placing them onto a perfection board.  Pt. required increased time to complete, and cues. Pt. Worked with task both timed, and untimed with 2 misidentifications. Pt. was able to self correct. Pt. performed [facility] tasks using the Grooved pegboard. Pt. worked on grasping the grooved pegs from a  horizontal position, and moving the pegs to a vertical position in the hand to prepare for placing them in the grooved slot. Pt. worked on translatory movement patterns of the left hand.   Pt. reports using the theraputty at home. Pt. Reports left thumb numbness. Pt. was able to manipulate the perfection pieces with his left hand.  Pt. worked with the task both timed, and untimed. Pt. was able to place 4 pieces in 60 sec. on the board. Pt. Performed timed trials placing 25 pegs in 4 min. & 46 sec.in the 1st timed trial, 25 pegs in 4 min & 12 sec  in the 2nd timed trial, and 25 pegs in 4 min. &14 sec. in the 3rd trial.  Pt. Continues to work on improving LUE motor control, and [facility] skills in order to improve Left hand function during ADLs, and IADL tasks.                         OT Education - 11/07/20 1203    Education Details [place], hand function, bi-manual coordination    Person(s) Educated Patient    Methods Explanation;Demonstration    Comprehension Verbalized understanding;Returned demonstration            OT Short Term Goals - 09/13/20 0858      OT SHORT  TERM GOAL #1   Title Pt will be compliant with HEP with green theraputty to increase fine motor control and independence.    Baseline Pt is independent with Theraputty HEP.    Time 4    Period Weeks    Status Achieved    Target Date 07/18/20             OT Long Term Goals - 10/22/20 1206      OT LONG TERM GOAL #1   Title Pt will increase FOTO goal from 68 to 71 to improve performance of HH IADL tasks and overall QOL.    Baseline 10/22/2020: FOTO score 74. 7/14, FOTO score 68, 10th visit:  72    Time 12    Period Weeks    Status On-going    Target Date 11/08/20      OT LONG TERM GOAL #2   Title Pt. will improve left grip strength by 10# to be able to improve his ability to hold ADL objects  in his hand.    Baseline 10/22/2020: 60# 10.05/2020: 60#    Time 12    Period Weeks    Status On-going     Target Date 12/05/20      OT LONG TERM GOAL #3   Title Pt. will improve LUE motor control in order to independently reach up to place items on a shelf.    Baseline 10/12/2020: Pt. conitnues to be able to reach up to place multiple items at one time on a shelf with his left hand.    Time 12    Period Weeks    Status Partially Met    Target Date 12/05/20      OT LONG TERM GOAL #4   Title Pt. will improve Left hand [place] skills by 3 sec. to be able to manipulate small objects independently for ADLs.    Baseline 10/22/2020: 37 sec.7/14 L hand 39 seconds (R hand 27 sec.) 10th  visit 37 secs left 09/12/2020: Left 37 sec., Right 24 sec.    Time 12    Period Weeks    Status On-going    Target Date 12/05/20      OT LONG TERM GOAL #5   Title Pt. will improve left hand function to be able to independently, and efficiently separate multiple sheets of papers during bill management.    Baseline 09/12/2020:Pt. has difficulty performing.    Time 12    Period Weeks    Status Partially Met    Target Date 12/05/20      OT LONG TERM GOAL #6   Title Pt. will independently type a one paragraph email correspondence accurately, and efficiently.    Baseline 10/22/2020: Pt. is pregressing. 09/12/2020: Pt has difficulty typing a one paragraph email correspondence efficiently.    Time 12    Period Weeks    Status On-going    Target Date 12/05/20                 Plan - 11/07/20 1204    Clinical Impression Statement Pt. reports using the theraputty at home. Pt. Reports left thumb numbness. Pt. was able to manipulate the perfection pieces with his left hand.  Pt. worked with the task both timed, and untimed. Pt. was able to place 4 pieces in 60 sec. on the board. Pt. Performed timed trials placing 25 pegs in 4 min. & 46 sec.in the 1st timed trial, 25 pegs in 4 min & 12 sec  in the 2nd  timed trial, and 25 pegs in 4 min. &14 sec. in the 3rd trial.  Pt. Continues to work on improving LUE motor control, and  [facility] skills in order to improve Left hand function during ADLs, and IADL tasks.   OT Occupational Profile and History Detailed Assessment- Review of Records and additional review of physical, cognitive, psychosocial history related to current functional performance    Occupational performance deficits (Please refer to evaluation for details): ADL's;IADL's    Body Structure / Function / Physical Skills ADL;Balance;FMC;GMC;Coordination;IADL;Strength;UE functional use    Rehab Potential Excellent    Clinical Decision Making Limited treatment options, no task modification necessary    Comorbidities Affecting Occupational Performance: May have comorbidities impacting occupational performance    Modification or Assistance to Complete Evaluation  Min-Moderate modification of tasks or assist with assess necessary to complete eval    OT Frequency 2x / week    OT Duration 12 weeks    OT Treatment/Interventions Self-care/ADL training;Therapeutic exercise;Patient/family education;DME and/or AE instruction;Therapeutic activities;Neuromuscular education    Plan Therapeutic activities to enhance FMC/control, self-care re-training to modify as needed, neuromuscular education to enhance L side awareness as able, patient education to enhance safety and carryover outside of therapy, Therapeutic exercise to improve strength as it pertains to safe and efficient performance of ADLs.    Consulted and Agree with Plan of Care Patient           Patient will benefit from skilled therapeutic intervention in order to improve the following deficits and impairments:   Body Structure / Function / Physical Skills: ADL, Balance, FMC, GMC, Coordination, IADL, Strength, UE functional use       Visit Diagnosis: Muscle weakness (generalized)  Other lack of coordination    Problem List Patient Active Problem List   Diagnosis Date Noted  . Malnutrition of moderate degree 06/25/2020  . Fall   . Palliative care by  specialist   . Subarachnoid hemorrhage ([place]) 06/23/2020  . HYPERCHOLESTEROLEMIA 12/04/2008  . 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 SUPERFICIAL PHLEBITIS 12/04/2008  . CHRONIC PROSTATITIS 12/04/2008  . PROSTATE SPECIFIC ANTIGEN, ELEVATED 12/04/2008    Edward Carina, Edward Campbell, Edward Campbell 11/07/2020, 12:06 PM  [place] MAIN [facility] SERVICES [address] [place], [place], [address] Phone: [phone]   Fax:  [phone]  Name: Edward Campbell MRN: [id number] Date of Birth: [birth year]

## 2020-11-12 ENCOUNTER — Other Ambulatory Visit: Payer: Self-pay

## 2020-11-12 ENCOUNTER — Ambulatory Visit: Payer: Medicare HMO

## 2020-11-12 ENCOUNTER — Ambulatory Visit: Payer: Medicare HMO | Admitting: Occupational Therapy

## 2020-11-12 ENCOUNTER — Encounter: Payer: Self-pay | Admitting: Occupational Therapy

## 2020-11-12 DIAGNOSIS — M6281 Muscle weakness (generalized): Secondary | ICD-10-CM

## 2020-11-12 DIAGNOSIS — R278 Other lack of coordination: Secondary | ICD-10-CM

## 2020-11-12 NOTE — Therapy (Signed)
[place] MAIN [facility] SERVICES [address] [place], [place], [address] Phone: [phone]   Fax:  [phone]  Occupational Therapy Treatment  Patient Details  Name: Edward Campbell MRN: [id number] Date of Birth: 1940/12/22 Referring Provider (OT): Dr.  Johny Drilling   Encounter Date: 11/12/2020   OT End of Session - 11/12/20 1157    Visit Number 26    Number of Visits 48    Date for OT Re-Evaluation 12/05/20    Authorization Time Period Progress report period starting 06/20/2020    OT Start Time 1145    OT Stop Time 1230    OT Time Calculation (min) 45 min    Activity Tolerance Patient tolerated treatment well;Patient limited by pain    Behavior During Therapy [facility] for tasks assessed/performed           Past Medical History:  Diagnosis Date  . Stroke [facility])     Past Surgical History:  Procedure Laterality Date  . BACK SURGERY      There were no vitals filed for this visit.   Subjective Assessment - 11/12/20 1156    Subjective  Pt. reports that he is doing well today.    Pertinent History Pt is an 79 y/o M with PMH: CVA with acute L sided weakness, numbness and dysarthria on 4/11 and 4/28 requiring hospitalization. Other pertinent medical history: includes HTN, HLD, and DVT.    Currently in Pain? No/denies          OT TREATMENT   Neuro muscular re-education:  Pt. worked on [facility] skills using the [employer] Task. Pt. worked on sustaining grasp on the resistive tweezers while grasping this sticks, and moving them from a horizontal position to a vertical position to prepare for placing them into the pegboard. Pt.required verbal cues, and cues for visual demonstration for wrist position, and hand pattern when placing them into the pegboard.Pt. worked on grasping and storing the 1" sticks in his left hand with multiple sticks dropping from the ulnar aspect of his hand.  Pt. reports that his left hand is doing much  better with the putty at home, and has not experienced any numbness in his left hand. Pt. continues to make steady progress, and his now having an easier time putting dishes away, and pick up items off of the floor.Pt. continues to present with limited coordination. Pt. is improving with manipulating the resistive tweezers while grasping the thin pegs form a horizontal position while moving them to a vertical position in preparation for placing them into a pegboard. Pt.has lessdifficulty manipulating the tweezers when grasping the 1" thin sticks. Pt.requiresincreased time to complete.Pt.required verbal cues, and visual demonstration for hand placement on the tweezers when removing the pegs.Pt. continues to work on improving LUE functioning in order to improve, and maximize overall independence with ADLs, and IADL tasks.                       OT Education - 11/12/20 1157    Education Details [place], hand function, bi-manual coordination    Person(s) Educated Patient    Methods Explanation;Demonstration    Comprehension Verbalized understanding;Returned demonstration            OT Short Term Goals - 09/13/20 0858      OT SHORT TERM GOAL #1   Title Pt will be compliant with HEP with green theraputty to increase fine motor control and independence.    Baseline Pt  is independent with Theraputty HEP.    Time 4    Period Weeks    Status Achieved    Target Date 07/18/20             OT Long Term Goals - 10/22/20 1206      OT LONG TERM GOAL #1   Title Pt will increase FOTO goal from 68 to 71 to improve performance of HH IADL tasks and overall QOL.    Baseline 10/22/2020: FOTO score 74. 7/14, FOTO score 68, 10th visit:  72    Time 12    Period Weeks    Status On-going    Target Date 11/08/20      OT LONG TERM GOAL #2   Title Pt. will improve left grip strength by 10# to be able to improve his ability to hold ADL objects  in his hand.    Baseline 10/22/2020: 60#  10.05/2020: 60#    Time 12    Period Weeks    Status On-going    Target Date 12/05/20      OT LONG TERM GOAL #3   Title Pt. will improve LUE motor control in order to independently reach up to place items on a shelf.    Baseline 10/12/2020: Pt. conitnues to be able to reach up to place multiple items at one time on a shelf with his left hand.    Time 12    Period Weeks    Status Partially Met    Target Date 12/05/20      OT LONG TERM GOAL #4   Title Pt. will improve Left hand [place] skills by 3 sec. to be able to manipulate small objects independently for ADLs.    Baseline 10/22/2020: 37 sec.7/14 L hand 39 seconds (R hand 27 sec.) 10th  visit 37 secs left 09/12/2020: Left 37 sec., Right 24 sec.    Time 12    Period Weeks    Status On-going    Target Date 12/05/20      OT LONG TERM GOAL #5   Title Pt. will improve left hand function to be able to independently, and efficiently separate multiple sheets of papers during bill management.    Baseline 09/12/2020:Pt. has difficulty performing.    Time 12    Period Weeks    Status Partially Met    Target Date 12/05/20      OT LONG TERM GOAL #6   Title Pt. will independently type a one paragraph email correspondence accurately, and efficiently.    Baseline 10/22/2020: Pt. is pregressing. 09/12/2020: Pt has difficulty typing a one paragraph email correspondence efficiently.    Time 12    Period Weeks    Status On-going    Target Date 12/05/20                 Plan - 11/12/20 1159    Clinical Impression Statement Pt. reports that his left hand is doing much better with the putty at home, and has not experienced any numbness in his left hand. Pt. continues to make steady progress, and his now having an easier time putting dishes away, and pick up items off of the floor.Pt. continues to present with limited coordination. Pt. is improving with manipulating the resistive tweezers while grasping the thin pegs form a horizontal position  while moving them to a vertical position in preparation for placing them into a pegboard. Pt.has lessdifficulty manipulating the tweezers when grasping the 1" thin sticks. Pt.requiresincreased time to complete.Pt.required  verbal cues, and visual demonstration for hand placement on the tweezers when removing the pegs.Pt. continues to work on improving LUE functioning in order to improve, and maximize overall independence with ADLs, and IADL tasks.   OT Occupational Profile and History Detailed Assessment- Review of Records and additional review of physical, cognitive, psychosocial history related to current functional performance    Occupational performance deficits (Please refer to evaluation for details): ADL's;IADL's    Body Structure / Function / Physical Skills ADL;Balance;FMC;GMC;Coordination;IADL;Strength;UE functional use    Rehab Potential Excellent    Clinical Decision Making Limited treatment options, no task modification necessary    Comorbidities Affecting Occupational Performance: May have comorbidities impacting occupational performance    Modification or Assistance to Complete Evaluation  Min-Moderate modification of tasks or assist with assess necessary to complete eval    OT Frequency 2x / week    OT Duration 12 weeks    OT Treatment/Interventions Self-care/ADL training;Therapeutic exercise;Patient/family education;DME and/or AE instruction;Therapeutic activities;Neuromuscular education    Plan Therapeutic activities to enhance FMC/control, self-care re-training to modify as needed, neuromuscular education to enhance L side awareness as able, patient education to enhance safety and carryover outside of therapy, Therapeutic exercise to improve strength as it pertains to safe and efficient performance of ADLs.    Consulted and Agree with Plan of Care Patient           Patient will benefit from skilled therapeutic intervention in order to improve the following deficits and  impairments:   Body Structure / Function / Physical Skills: ADL, Balance, FMC, GMC, Coordination, IADL, Strength, UE functional use       Visit Diagnosis: Muscle weakness (generalized)  Other lack of coordination    Problem List Patient Active Problem List   Diagnosis Date Noted  . Malnutrition of moderate degree 06/25/2020  . Fall   . Palliative care by specialist   . Subarachnoid hemorrhage ([place]) 06/23/2020  . HYPERCHOLESTEROLEMIA 12/04/2008  . 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 SUPERFICIAL PHLEBITIS 12/04/2008  . CHRONIC PROSTATITIS 12/04/2008  . PROSTATE SPECIFIC ANTIGEN, ELEVATED 12/04/2008    Harrel Carina, MS, OTR/L 11/12/2020, 12:01 PM  [place] MAIN [facility] SERVICES [address] [place], [place], [address] Phone: [phone]   Fax:  [phone]  Name: Edward Campbell MRN: [id number] Date of Birth: 1941/11/16

## 2020-11-14 ENCOUNTER — Ambulatory Visit: Payer: Medicare HMO

## 2020-11-14 ENCOUNTER — Ambulatory Visit: Payer: Medicare HMO | Admitting: Occupational Therapy

## 2020-11-19 ENCOUNTER — Encounter: Payer: Self-pay | Admitting: Occupational Therapy

## 2020-11-19 ENCOUNTER — Ambulatory Visit: Payer: Medicare HMO | Admitting: Occupational Therapy

## 2020-11-19 ENCOUNTER — Ambulatory Visit: Payer: Medicare HMO

## 2020-11-19 ENCOUNTER — Other Ambulatory Visit: Payer: Self-pay

## 2020-11-19 DIAGNOSIS — M6281 Muscle weakness (generalized): Secondary | ICD-10-CM | POA: Diagnosis not present

## 2020-11-19 DIAGNOSIS — R278 Other lack of coordination: Secondary | ICD-10-CM

## 2020-11-19 NOTE — Therapy (Signed)
[place] MAIN [facility] SERVICES [address] [place], [place], [address] Phone: [phone]   Fax:  [phone]  Occupational Therapy Treatment  Patient Details  Name: Edward Campbell MRN: [id number] Date of Birth: 30-Jan-1941 Referring Provider (OT): Dr.  Johny Drilling   Encounter Date: 11/19/2020   OT End of Session - 11/19/20 1153    Visit Number 27    Number of Visits 48    Date for OT Re-Evaluation 12/05/20    Authorization Time Period Progress report period starting 06/20/2020    OT Start Time 1152    OT Stop Time 1230    OT Time Calculation (min) 38 min    Equipment Utilized During Treatment dynamometer, 9 hold peg test, pinch test.    Activity Tolerance Patient tolerated treatment well;Patient limited by pain    Behavior During Therapy [facility] for tasks assessed/performed           Past Medical History:  Diagnosis Date  . Stroke [facility])     Past Surgical History:  Procedure Laterality Date  . BACK SURGERY      There were no vitals filed for this visit.   Subjective Assessment - 11/19/20 1152    Pertinent History Pt is an 79 y/o M with PMH: CVA with acute L sided weakness, numbness and dysarthria on 4/11 and 4/28 requiring hospitalization. Other pertinent medical history: includes HTN, HLD, and DVT.    Currently in Pain? No/denies           OT TREATMENT    Neuro muscular re-education:  Pt. worked on left hand [facility] skills grasping 1" perfection shapes, and placing them onto a perfection board.  Pt. required increased time to complete, and cues. Pt. Worked with task both timed, and untimed with 0 misidentifications. Multiple reps were performed.  Pt. reports that he continues to use the theraputty at home. Pt. reports intermittent left thumb numbness. Pt. was able to manipulate the perfection pieces with his left hand.  Pt. worked with the task both timed, and untimed. Pt. was able to place 12 pieces in 53 sec. on the board, 7  pieces in 55 sec.  Pt. Performed timed trials placing 25 pegs in 3 min. in the 1st timed trial, 25 pegs in 4 min & 36 sec. with dual tasking while talking in the 2nd timed trial, and 25 pegs in 3 min. & 40 sec. in the 3rd trial.  Pt. Continues to work on improving LUE motor control, and [facility] skills in order to improve Left hand function during ADLs, and IADL tasks                      OT Education - 11/19/20 1152    Education Details [place], hand function, bi-manual coordination    Person(s) Educated Patient    Methods Explanation;Demonstration    Comprehension Verbalized understanding;Returned demonstration            OT Short Term Goals - 09/13/20 0858      OT SHORT TERM GOAL #1   Title Pt will be compliant with HEP with green theraputty to increase fine motor control and independence.    Baseline Pt is independent with Theraputty HEP.    Time 4    Period Weeks    Status Achieved    Target Date 07/18/20             OT Long Term Goals - 10/22/20 1206  OT LONG TERM GOAL #1   Title Pt will increase FOTO goal from 68 to 71 to improve performance of HH IADL tasks and overall QOL.    Baseline 10/22/2020: FOTO score 74. 7/14, FOTO score 68, 10th visit:  72    Time 12    Period Weeks    Status On-going    Target Date 11/08/20      OT LONG TERM GOAL #2   Title Pt. will improve left grip strength by 10# to be able to improve his ability to hold ADL objects  in his hand.    Baseline 10/22/2020: 60# 10.05/2020: 60#    Time 12    Period Weeks    Status On-going    Target Date 12/05/20      OT LONG TERM GOAL #3   Title Pt. will improve LUE motor control in order to independently reach up to place items on a shelf.    Baseline 10/12/2020: Pt. conitnues to be able to reach up to place multiple items at one time on a shelf with his left hand.    Time 12    Period Weeks    Status Partially Met    Target Date 12/05/20      OT LONG TERM GOAL #4   Title Pt. will  improve Left hand [place] skills by 3 sec. to be able to manipulate small objects independently for ADLs.    Baseline 10/22/2020: 37 sec.7/14 L hand 39 seconds (R hand 27 sec.) 10th  visit 37 secs left 09/12/2020: Left 37 sec., Right 24 sec.    Time 12    Period Weeks    Status On-going    Target Date 12/05/20      OT LONG TERM GOAL #5   Title Pt. will improve left hand function to be able to independently, and efficiently separate multiple sheets of papers during bill management.    Baseline 09/12/2020:Pt. has difficulty performing.    Time 12    Period Weeks    Status Partially Met    Target Date 12/05/20      OT LONG TERM GOAL #6   Title Pt. will independently type a one paragraph email correspondence accurately, and efficiently.    Baseline 10/22/2020: Pt. is pregressing. 09/12/2020: Pt has difficulty typing a one paragraph email correspondence efficiently.    Time 12    Period Weeks    Status On-going    Target Date 12/05/20                 Plan - 11/19/20 1153    Clinical Impression Statement Pt. reports that he continues to use the theraputty at home. Pt. reports intermittent left thumb numbness. Pt. was able to manipulate the perfection pieces with his left hand.  Pt. worked with the task both timed, and untimed. Pt. was able to place 12 pieces in 53 sec. on the board, 7 pieces in 55 sec.  Pt. Performed timed trials placing 25 pegs in 3 min. in the 1st timed trial, 25 pegs in 4 min & 36 sec. with dual tasking while talking in the 2nd timed trial, and 25 pegs in 3 min. & 40 sec. in the 3rd trial.  Pt. Continues to work on improving LUE motor control, and [facility] skills in order to improve Left hand function during ADLs, and IADL tasks   OT Occupational Profile and History Detailed Assessment- Review of Records and additional review of physical, cognitive, psychosocial history related to current functional  performance    Occupational performance deficits (Please refer to evaluation  for details): ADL's;IADL's    Body Structure / Function / Physical Skills ADL;Balance;FMC;GMC;Coordination;IADL;Strength;UE functional use    Rehab Potential Excellent    Clinical Decision Making Limited treatment options, no task modification necessary    Comorbidities Affecting Occupational Performance: May have comorbidities impacting occupational performance    Modification or Assistance to Complete Evaluation  Min-Moderate modification of tasks or assist with assess necessary to complete eval    OT Frequency 2x / week    OT Duration 12 weeks    OT Treatment/Interventions Self-care/ADL training;Therapeutic exercise;Patient/family education;DME and/or AE instruction;Therapeutic activities;Neuromuscular education    Plan Therapeutic activities to enhance FMC/control, self-care re-training to modify as needed, neuromuscular education to enhance L side awareness as able, patient education to enhance safety and carryover outside of therapy, Therapeutic exercise to improve strength as it pertains to safe and efficient performance of ADLs.    Consulted and Agree with Plan of Care Patient           Patient will benefit from skilled therapeutic intervention in order to improve the following deficits and impairments:   Body Structure / Function / Physical Skills: ADL,Balance,FMC,GMC,Coordination,IADL,Strength,UE functional use       Visit Diagnosis: Muscle weakness (generalized)  Other lack of coordination    Problem List Patient Active Problem List   Diagnosis Date Noted  . Malnutrition of moderate degree 06/25/2020  . Fall   . Palliative care by specialist   . Subarachnoid hemorrhage ([place]) 06/23/2020  . HYPERCHOLESTEROLEMIA 12/04/2008  . 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 SUPERFICIAL PHLEBITIS 12/04/2008  . CHRONIC PROSTATITIS 12/04/2008  . PROSTATE SPECIFIC ANTIGEN, ELEVATED 12/04/2008    Harrel Carina, MS, OTR/L 11/19/2020, 12:05  PM  [place] MAIN [facility] SERVICES [address] [place], [place], [address] Phone: [phone]   Fax:  [phone]  Name: Edward Campbell MRN: [id number] Date of Birth: [birth year]

## 2020-11-21 ENCOUNTER — Encounter: Payer: Self-pay | Admitting: Occupational Therapy

## 2020-11-21 ENCOUNTER — Ambulatory Visit: Payer: Medicare HMO

## 2020-11-21 ENCOUNTER — Ambulatory Visit: Payer: Medicare HMO | Admitting: Occupational Therapy

## 2020-11-21 ENCOUNTER — Other Ambulatory Visit: Payer: Self-pay

## 2020-11-21 DIAGNOSIS — R278 Other lack of coordination: Secondary | ICD-10-CM

## 2020-11-21 DIAGNOSIS — M6281 Muscle weakness (generalized): Secondary | ICD-10-CM | POA: Diagnosis not present

## 2020-11-21 NOTE — Therapy (Signed)
[place] MAIN [facility] SERVICES [address] [place], [place], [address] Phone: [phone]   Fax:  [phone]  Occupational Therapy Treatment  Patient Details  Name: Edward Campbell MRN: [id number] Date of Birth: 80-29-1942 Referring Provider (OT): Dr.  Johny Drilling   Encounter Date: 11/21/2020   OT End of Session - 11/21/20 1201    Visit Number 28    Number of Visits 48    Date for OT Re-Evaluation 12/05/20    Authorization Time Period Progress report period starting 06/20/2020    OT Start Time 1148    OT Stop Time 1230    OT Time Calculation (min) 42 min    Activity Tolerance Patient tolerated treatment well;Patient limited by pain    Behavior During Therapy [facility] for tasks assessed/performed           Past Medical History:  Diagnosis Date  . Stroke [facility])     Past Surgical History:  Procedure Laterality Date  . BACK SURGERY      There were no vitals filed for this visit.   Subjective Assessment - 11/21/20 1159    Subjective  Pt. reports that he is doing well today.    Pertinent History Pt is an 79 y/o M with PMH: CVA with acute L sided weakness, numbness and dysarthria on 4/11 and 4/28 requiring hospitalization. Other pertinent medical history: includes HTN, HLD, and DVT.    Limitations LUE strength, motor control, and [facility] skills.    Currently in Pain? No/denies          OT TREATMENT   Neuro muscular re-education:  Pt. worked on [facility] skills using the [employer] Task. Pt. worked on sustaining grasp on the resistive tweezers while grasping this sticks, and moving them from a horizontal position to a vertical position to prepare for placing them into the pegboard. Pt.required verbal cues, and cues for visual demonstration for wrist position, and hand pattern when placing them into the pegboard.Pt. worked on grasping and storing the 1" sticks in his left hand with multiple sticks dropping from the ulnar aspect of  his hand.  Pt. reports that the putty exercises at home are going slowly, and has not experienced any numbness in his left hand. Pt.continues to [facility] progress, and is now having an easier time putting dishes away, and pick up items off of the floor.Pt. reports that he has been cleaning out his bookshelves at home, and has been using his left hand to assist. Pt. reports having difficulty with using his nondominant LUE while vacuuming, however pt. reports mostly using his dominant RUE during vacuuming.  Pt. continues to present with limited coordination in the left hand, however hand progressed overall. Pt. is improving with manipulating the resistive tweezers while grasping the thin pegs form a horizontal position while moving them to a vertical position in preparation for placing them into a pegboard. Pt.has lessdifficulty manipulating the tweezers when grasping the 1" thin sticks. Pt. As the task progressed pt. had difficulty dropping sticks from the tweezers when turning them from horizontal to vertical. Pt.requiresincreased time to complete the task.Pt.required verbal cues, and visual demonstration for hand placement on the tweezers when removing the pegs.Pt. continues to work on improving LUE functioning in order to improve, and maximize overall independence with ADLs, and IADL tasks.                         OT Education - 11/21/20 1201  Education Details [place], hand function, bi-manual coordination    Person(s) Educated Patient    Methods Explanation;Demonstration    Comprehension Verbalized understanding;Returned demonstration            OT Short Term Goals - 09/13/20 0858      OT SHORT TERM GOAL #1   Title Pt will be compliant with HEP with green theraputty to increase fine motor control and independence.    Baseline Pt is independent with Theraputty HEP.    Time 4    Period Weeks    Status Achieved    Target Date 07/18/20             OT  Long Term Goals - 10/22/20 1206      OT LONG TERM GOAL #1   Title Pt will increase FOTO goal from 68 to 71 to improve performance of HH IADL tasks and overall QOL.    Baseline 10/22/2020: FOTO score 74. 7/14, FOTO score 68, 10th visit:  72    Time 12    Period Weeks    Status On-going    Target Date 11/08/20      OT LONG TERM GOAL #2   Title Pt. will improve left grip strength by 10# to be able to improve his ability to hold ADL objects  in his hand.    Baseline 10/22/2020: 60# 10.05/2020: 60#    Time 12    Period Weeks    Status On-going    Target Date 12/05/20      OT LONG TERM GOAL #3   Title Pt. will improve LUE motor control in order to independently reach up to place items on a shelf.    Baseline 10/12/2020: Pt. conitnues to be able to reach up to place multiple items at one time on a shelf with his left hand.    Time 12    Period Weeks    Status Partially Met    Target Date 12/05/20      OT LONG TERM GOAL #4   Title Pt. will improve Left hand [place] skills by 3 sec. to be able to manipulate small objects independently for ADLs.    Baseline 10/22/2020: 37 sec.7/14 L hand 39 seconds (R hand 27 sec.) 10th  visit 37 secs left 09/12/2020: Left 37 sec., Right 24 sec.    Time 12    Period Weeks    Status On-going    Target Date 12/05/20      OT LONG TERM GOAL #5   Title Pt. will improve left hand function to be able to independently, and efficiently separate multiple sheets of papers during bill management.    Baseline 09/12/2020:Pt. has difficulty performing.    Time 12    Period Weeks    Status Partially Met    Target Date 12/05/20      OT LONG TERM GOAL #6   Title Pt. will independently type a one paragraph email correspondence accurately, and efficiently.    Baseline 10/22/2020: Pt. is pregressing. 09/12/2020: Pt has difficulty typing a one paragraph email correspondence efficiently.    Time 12    Period Weeks    Status On-going    Target Date 12/05/20                  Plan - 11/21/20 1202    Clinical Impression Statement Pt. reports that the putty exercises at home are going slowly, and has not experienced any numbness in his left hand. Pt.continues to [place] progress,  and is now having an easier time putting dishes away, and pick up items off of the floor.Pt. reports that he has been cleaning out his bookshelves at home, and has been using his left hand to assist. Pt. reports having difficulty with using his nondominant LUE while vacuuming, however pt. reports mostly using his dominant RUE during vacuuming.  Pt. continues to present with limited coordination in the left hand, however hand progressed overall. Pt. is improving with manipulating the resistive tweezers while grasping the thin pegs form a horizontal position while moving them to a vertical position in preparation for placing them into a pegboard. Pt.has lessdifficulty manipulating the tweezers when grasping the 1" thin sticks. Pt. As the task progressed pt. had difficulty dropping sticks from the tweezers when turning them from horizontal to vertical. Pt.requiresincreased time to complete the task.Pt.required verbal cues, and visual demonstration for hand placement on the tweezers when removing the pegs.Pt. continues to work on improving LUE functioning in order to improve, and maximize overall independence with ADLs, and IADL tasks.   OT Occupational Profile and History Detailed Assessment- Review of Records and additional review of physical, cognitive, psychosocial history related to current functional performance    Occupational performance deficits (Please refer to evaluation for details): ADL's;IADL's    Body Structure / Function / Physical Skills ADL;Balance;FMC;GMC;Coordination;IADL;Strength;UE functional use    Rehab Potential Excellent    Clinical Decision Making Limited treatment options, no task modification necessary    Comorbidities Affecting Occupational  Performance: May have comorbidities impacting occupational performance    Modification or Assistance to Complete Evaluation  Min-Moderate modification of tasks or assist with assess necessary to complete eval    OT Frequency 2x / week    OT Duration 12 weeks    OT Treatment/Interventions Self-care/ADL training;Therapeutic exercise;Patient/family education;DME and/or AE instruction;Therapeutic activities;Neuromuscular education    Plan Therapeutic activities to enhance FMC/control, self-care re-training to modify as needed, neuromuscular education to enhance L side awareness as able, patient education to enhance safety and carryover outside of therapy, Therapeutic exercise to improve strength as it pertains to safe and efficient performance of ADLs.    Consulted and Agree with Plan of Care Patient           Patient will benefit from skilled therapeutic intervention in order to improve the following deficits and impairments:   Body Structure / Function / Physical Skills: ADL,Balance,FMC,GMC,Coordination,IADL,Strength,UE functional use       Visit Diagnosis: Muscle weakness (generalized)  Other lack of coordination    Problem List Patient Active Problem List   Diagnosis Date Noted  . Malnutrition of moderate degree 06/25/2020  . Fall   . Palliative care by specialist   . Subarachnoid hemorrhage ([place]) 06/23/2020  . HYPERCHOLESTEROLEMIA 12/04/2008  . 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 SUPERFICIAL PHLEBITIS 12/04/2008  . CHRONIC PROSTATITIS 12/04/2008  . PROSTATE SPECIFIC ANTIGEN, ELEVATED 12/04/2008    Harrel Carina, MS, OTR/L 11/21/2020, 12:04 PM  [place] MAIN [facility] SERVICES [address] [place], [place], [address] Phone: [phone]   Fax:  [phone]  Name: Edward Campbell MRN: [id number] Date of Birth: [birth year]

## 2020-11-26 ENCOUNTER — Ambulatory Visit: Payer: Medicare HMO

## 2020-11-26 ENCOUNTER — Encounter: Payer: Self-pay | Admitting: Occupational Therapy

## 2020-11-26 ENCOUNTER — Other Ambulatory Visit: Payer: Self-pay

## 2020-11-26 ENCOUNTER — Ambulatory Visit: Payer: Medicare HMO | Admitting: Occupational Therapy

## 2020-11-26 DIAGNOSIS — M6281 Muscle weakness (generalized): Secondary | ICD-10-CM

## 2020-11-26 DIAGNOSIS — R278 Other lack of coordination: Secondary | ICD-10-CM

## 2020-11-26 NOTE — Therapy (Signed)
[place] MAIN [facility] SERVICES [address] [place], [place], [address] Phone: [phone]   Fax:  [phone]  Occupational Therapy Treatment  Patient Details  Name: Edward Campbell MRN: [id number] Date of Birth: Sep 24, 1941 Referring Provider (OT): Dr.  Johny Drilling   Encounter Date: 11/26/2020   OT End of Session - 11/26/20 1157    Visit Number 29    Number of Visits 48    Date for OT Re-Evaluation 12/05/20    Authorization Time Period Progress report period starting 06/20/2020    OT Start Time 1149    OT Stop Time 1230    OT Time Calculation (min) 41 min    Activity Tolerance Patient tolerated treatment well;Patient limited by pain    Behavior During Therapy [facility] for tasks assessed/performed           Past Medical History:  Diagnosis Date  . Stroke [facility])     Past Surgical History:  Procedure Laterality Date  . BACK SURGERY      There were no vitals filed for this visit.   Subjective Assessment - 11/26/20 1156    Subjective  Pt. reports doing well today.    Pertinent History Pt is an 79 y/o M with PMH: CVA with acute L sided weakness, numbness and dysarthria on 4/11 and 4/28 requiring hospitalization. Other pertinent medical history: includes HTN, HLD, and DVT.    Limitations LUE strength, motor control, and [facility] skills.    Currently in Pain? No/denies          OT TREATMENT   Neuro muscular re-education:  Pt. worked on [facility] skills using the [employer] Task. Pt. worked on sustaining grasp on the resistive tweezers while grasping this sticks, and moving them from a horizontal position to a vertical position to prepare for placing them into the pegboard. Pt.required verbal cues, and cues for visual demonstration for wrist position, and hand pattern when placing them into the pegboard.Pt. worked on grasping and storing the 1" sticks in his left hand with multiple sticks dropping from the ulnar aspect of his  hand.  Pt. reports that the putty exercises at home continue to go slowly, and has not experienced any numbness in his left hand.Pt.continues to [facility] progress, and is now having an easier time putting dishes away, and pick up items off of the floor.Pt. reports that he has been cleaning out his bookshelves at home, and has been using his left hand to assist. Pt. reports having difficulty with using his nondominant LUE while vacuuming, however pt. reports mostly using his dominant RUE during vacuuming.  Pt. continues to present with limited coordination in the left hand, however hand progressed overall. Pt.is improving withmanipulatingthe resistive tweezers while grasping the thin pegs form a horizontal position while moving them to a vertical position in preparation for placing them into a pegboard. Pt. continues to present with lessdifficulty manipulating the tweezers when grasping the 1" thin sticks. Pt. As the task progressed pt. had difficulty dropping sticks from the tweezers when turning them from horizontal to vertical. Pt.is improving, requiring decreased  time to complete the task.Pt.required verbal cues, and visual demonstration for hand placement on the tweezers when removing the pegs.Pt. continues to work on improving LUE functioning in order to improve, and maximize overall independence with ADLs, and IADL tasks.                     OT Education - 11/26/20 4680  Education Details [place], hand function, bi-manual coordination    Person(s) Educated Patient    Methods Explanation;Demonstration    Comprehension Verbalized understanding;Returned demonstration            OT Short Term Goals - 09/13/20 0858      OT SHORT TERM GOAL #1   Title Pt will be compliant with HEP with green theraputty to increase fine motor control and independence.    Baseline Pt is independent with Theraputty HEP.    Time 4    Period Weeks    Status Achieved    Target Date  07/18/20             OT Long Term Goals - 10/22/20 1206      OT LONG TERM GOAL #1   Title Pt will increase FOTO goal from 68 to 71 to improve performance of HH IADL tasks and overall QOL.    Baseline 10/22/2020: FOTO score 74. 7/14, FOTO score 68, 10th visit:  72    Time 12    Period Weeks    Status On-going    Target Date 11/08/20      OT LONG TERM GOAL #2   Title Pt. will improve left grip strength by 10# to be able to improve his ability to hold ADL objects  in his hand.    Baseline 10/22/2020: 60# 10.05/2020: 60#    Time 12    Period Weeks    Status On-going    Target Date 12/05/20      OT LONG TERM GOAL #3   Title Pt. will improve LUE motor control in order to independently reach up to place items on a shelf.    Baseline 10/12/2020: Pt. conitnues to be able to reach up to place multiple items at one time on a shelf with his left hand.    Time 12    Period Weeks    Status Partially Met    Target Date 12/05/20      OT LONG TERM GOAL #4   Title Pt. will improve Left hand [place] skills by 3 sec. to be able to manipulate small objects independently for ADLs.    Baseline 10/22/2020: 37 sec.7/14 L hand 39 seconds (R hand 27 sec.) 10th  visit 37 secs left 09/12/2020: Left 37 sec., Right 24 sec.    Time 12    Period Weeks    Status On-going    Target Date 12/05/20      OT LONG TERM GOAL #5   Title Pt. will improve left hand function to be able to independently, and efficiently separate multiple sheets of papers during bill management.    Baseline 09/12/2020:Pt. has difficulty performing.    Time 12    Period Weeks    Status Partially Met    Target Date 12/05/20      OT LONG TERM GOAL #6   Title Pt. will independently type a one paragraph email correspondence accurately, and efficiently.    Baseline 10/22/2020: Pt. is pregressing. 09/12/2020: Pt has difficulty typing a one paragraph email correspondence efficiently.    Time 12    Period Weeks    Status On-going     Target Date 12/05/20                 Plan - 11/26/20 1158    Clinical Impression Statement Pt. reports that the putty exercises at home continue to go slowly, and has not experienced any numbness in his left hand.Pt.continues to [place] progress,  and is now having an easier time putting dishes away, and pick up items off of the floor.Pt. reports that he has been cleaning out his bookshelves at home, and has been using his left hand to assist. Pt. reports having difficulty with using his nondominant LUE while vacuuming, however pt. reports mostly using his dominant RUE during vacuuming.  Pt. continues to present with limited coordination in the left hand, however hand progressed overall. Pt.is improving withmanipulatingthe resistive tweezers while grasping the thin pegs form a horizontal position while moving them to a vertical position in preparation for placing them into a pegboard. Pt. continues to present with lessdifficulty manipulating the tweezers when grasping the 1" thin sticks. Pt. As the task progressed pt. had difficulty dropping sticks from the tweezers when turning them from horizontal to vertical. Pt.is improving, requiring decreased  time to complete the task.Pt.required verbal cues, and visual demonstration for hand placement on the tweezers when removing the pegs.Pt. continues to work on improving LUE functioning in order to improve, and maximize overall independence with ADLs, and IADL tasks.   OT Occupational Profile and History Detailed Assessment- Review of Records and additional review of physical, cognitive, psychosocial history related to current functional performance    Occupational performance deficits (Please refer to evaluation for details): ADL's;IADL's    Body Structure / Function / Physical Skills ADL;Balance;FMC;GMC;Coordination;IADL;Strength;UE functional use    Rehab Potential Excellent    Clinical Decision Making Limited treatment options, no task  modification necessary    Comorbidities Affecting Occupational Performance: May have comorbidities impacting occupational performance    Modification or Assistance to Complete Evaluation  Min-Moderate modification of tasks or assist with assess necessary to complete eval    OT Frequency 2x / week    OT Duration 12 weeks    OT Treatment/Interventions Self-care/ADL training;Therapeutic exercise;Patient/family education;DME and/or AE instruction;Therapeutic activities;Neuromuscular education    Plan Therapeutic activities to enhance FMC/control, self-care re-training to modify as needed, neuromuscular education to enhance L side awareness as able, patient education to enhance safety and carryover outside of therapy, Therapeutic exercise to improve strength as it pertains to safe and efficient performance of ADLs.    Consulted and Agree with Plan of Care Patient           Patient will benefit from skilled therapeutic intervention in order to improve the following deficits and impairments:   Body Structure / Function / Physical Skills: ADL,Balance,FMC,GMC,Coordination,IADL,Strength,UE functional use       Visit Diagnosis: Muscle weakness (generalized)  Other lack of coordination    Problem List Patient Active Problem List   Diagnosis Date Noted  . Malnutrition of moderate degree 06/25/2020  . Fall   . Palliative care by specialist   . Subarachnoid hemorrhage ([place]) 06/23/2020  . HYPERCHOLESTEROLEMIA 12/04/2008  . 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 SUPERFICIAL PHLEBITIS 12/04/2008  . CHRONIC PROSTATITIS 12/04/2008  . PROSTATE SPECIFIC ANTIGEN, ELEVATED 12/04/2008    Harrel Carina, MS, OTR/L 11/26/2020, 12:02 PM  [place] MAIN [facility] SERVICES [address] [place], [place], [address] Phone: [phone]   Fax:  [phone]  Name: Edward Campbell MRN: [id number] Date of Birth: 08-Jan-1941

## 2020-11-28 ENCOUNTER — Ambulatory Visit: Payer: Medicare HMO

## 2020-11-28 ENCOUNTER — Other Ambulatory Visit: Payer: Self-pay

## 2020-11-28 ENCOUNTER — Encounter: Payer: Self-pay | Admitting: Occupational Therapy

## 2020-11-28 ENCOUNTER — Ambulatory Visit: Payer: Medicare HMO | Admitting: Occupational Therapy

## 2020-11-28 DIAGNOSIS — R278 Other lack of coordination: Secondary | ICD-10-CM

## 2020-11-28 DIAGNOSIS — M6281 Muscle weakness (generalized): Secondary | ICD-10-CM | POA: Diagnosis not present

## 2020-11-28 NOTE — Therapy (Signed)
[place] MAIN [facility] SERVICES [address] [place], [place], [address] Phone: [phone]   Fax:  [phone]  Occupational Therapy Progress/Discharge Note  Dates of reporting period  10/24/2020   to   11/28/2020  Patient Details  Name: Edward Campbell MRN: [id number] Date of Birth: 03-Aug-1941 Referring Provider (OT): Dr.  Johny Drilling   Encounter Date: 11/28/2020   OT End of Session - 11/28/20 1152    Visit Number 30    Number of Visits 48    Date for OT Re-Evaluation 12/05/20    Authorization Type FOTO    Authorization Time Period Progress report period starting 06/20/2020    OT Start Time 1148    OT Stop Time 0230    OT Time Calculation (min) 882 min    Activity Tolerance Patient tolerated treatment well;Patient limited by pain    Behavior During Therapy [facility] for tasks assessed/performed           Past Medical History:  Diagnosis Date  . Stroke [facility])     Past Surgical History:  Procedure Laterality Date  . BACK SURGERY      There were no vitals filed for this visit.   Subjective Assessment - 11/28/20 1151    Subjective  Pt. reports that he ordered a Building services engineer task  for home.    Pertinent History Pt is an 79 y/o M with PMH: CVA with acute L sided weakness, numbness and dysarthria on 4/11 and 4/28 requiring hospitalization. Other pertinent medical history: includes HTN, HLD, and DVT.    Special Tests Vision: scanning in tact, peripheral vision in tact in all 4 quadrants, appropriate tracking and convergence.    Currently in Pain? No/denies              [facility] OT Assessment - 11/28/20 0001      Coordination   Left 9 Hole Peg Test 33      Hand Function   Left Hand Grip (lbs) 60    Left Hand Lateral Pinch 17 lbs    Left 3 point pinch 20 lbs           Measurements were obtained, and goals were reviewed. Pt. has met his overall goals. Pt. is independent with HEPs for his left hand. Pt. Is now using his  left hand to assist with vacuuming, reaching to place items on shelves, opening the refrigerator door, writing, and typing emails. Pt.'s FOTO score 97 which exceeded the typical result score of 76 calculated for him. Pt. is now appropriate for discharge from OT services at this time.                  OT Education - 11/28/20 1152    Education Details [place], hand function, bi-manual coordination    Person(s) Educated Patient    Methods Explanation;Demonstration    Comprehension Verbalized understanding;Returned demonstration            OT Short Term Goals - 09/13/20 0858      OT SHORT TERM GOAL #1   Title Pt will be compliant with HEP with green theraputty to increase fine motor control and independence.    Baseline Pt is independent with Theraputty HEP.    Time 4    Period Weeks    Status Achieved    Target Date 07/18/20             OT Long Term Goals - 11/28/20 1203      OT  LONG TERM GOAL #1   Title Pt will increase FOTO goal from 68 to 71 to improve performance of HH IADL tasks and overall QOL.    Baseline 11/28/2020: FOTO score: 97 10/22/2020: FOTO: FOTO score 74. 7/14, FOTO score 68, 10th visit:  72    Time 12    Period Weeks    Status Achieved      OT LONG TERM GOAL #2   Title Pt. will improve left grip strength by 10# to be able to improve his ability to hold ADL objects  in his hand.    Baseline 11/28/2020: 10/22/2020: 60# 10.05/2020: 60#    Time 12    Period Weeks    Status Partially Met      OT LONG TERM GOAL #3   Title Pt. will improve LUE motor control in order to independently reach up to place items on a shelf.    Baseline 10/12/2020: Pt. conitnues to be able to reach up to place multiple items at one time on a shelf with his left hand.    Time 12    Period Weeks    Status Achieved      OT LONG TERM GOAL #4   Title Pt. will improve Left hand [place] skills by 3 sec. to be able to manipulate small objects independently for ADLs.    Baseline  11/28/2020: 33 sec. 10/22/2020: 37 sec.7/14 L hand 39 seconds (R hand 27 sec.) 10th  visit 37 secs left 09/12/2020: Left 37 sec., Right 24 sec.    Time 12    Period Weeks    Status Achieved      OT LONG TERM GOAL #5   Title Pt. will improve left hand function to be able to independently, and efficiently separate multiple sheets of papers during bill management.    Baseline 112/22/2021: pt. has improved. 09/12/2020:Pt. has difficulty performing.    Time 12    Period Weeks    Status Achieved      OT LONG TERM GOAL #6   Title Pt. will independently type a one paragraph email correspondence accurately, and efficiently.    Baseline 11/28/2020: Pt. is improving with typing. 10/22/2020: Pt. is pregressing. 09/12/2020: Pt has difficulty typing a one paragraph email correspondence efficiently.    Time 12    Period Weeks    Status Achieved                 Plan - 11/28/20 1153    Clinical Impression Statement Measurements were obtained, and goals were reviewed. Pt. has met his overall goals. Pt. is independent with HEPs for his left hand. Pt. Is now using his left hand to assist with vacuuming, reaching to place items on shelves, opening the refrigerator door, writing, and typing emails. Pt.'s FOTO score 97 which exceeded the typical result score of 76 calculated for him. Pt. is now appropriate for discharge from OT services at this time.    OT Occupational Profile and History Detailed Assessment- Review of Records and additional review of physical, cognitive, psychosocial history related to current functional performance    Occupational performance deficits (Please refer to evaluation for details): ADL's;IADL's    Body Structure / Function / Physical Skills ADL;Balance;FMC;GMC;Coordination;IADL;Strength;UE functional use    Rehab Potential Excellent    Clinical Decision Making Limited treatment options, no task modification necessary    Comorbidities Affecting Occupational Performance: May  have comorbidities impacting occupational performance    Modification or Assistance to Complete Evaluation  Min-Moderate modification of  tasks or assist with assess necessary to complete eval    OT Frequency 2x / week    OT Duration 12 weeks    OT Treatment/Interventions Self-care/ADL training;Therapeutic exercise;Patient/family education;DME and/or AE instruction;Therapeutic activities;Neuromuscular education    Plan Therapeutic activities to enhance FMC/control, self-care re-training to modify as needed, neuromuscular education to enhance L side awareness as able, patient education to enhance safety and carryover outside of therapy, Therapeutic exercise to improve strength as it pertains to safe and efficient performance of ADLs.    Consulted and Agree with Plan of Care Patient           Patient will benefit from skilled therapeutic intervention in order to improve the following deficits and impairments:   Body Structure / Function / Physical Skills: ADL,Balance,FMC,GMC,Coordination,IADL,Strength,UE functional use       Visit Diagnosis: Muscle weakness (generalized)  Other lack of coordination    Problem List Patient Active Problem List   Diagnosis Date Noted  . Malnutrition of moderate degree 06/25/2020  . Fall   . Palliative care by specialist   . Subarachnoid hemorrhage ([place]) 06/23/2020  . HYPERCHOLESTEROLEMIA 12/04/2008  . 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 SUPERFICIAL PHLEBITIS 12/04/2008  . CHRONIC PROSTATITIS 12/04/2008  . PROSTATE SPECIFIC ANTIGEN, ELEVATED 12/04/2008    Harrel Carina, MS, OTR/L 11/28/2020, 12:30 PM  [place] MAIN [facility] SERVICES [address] [place], [place], [address] Phone: [phone]   Fax:  [phone]  Name: MURLE OTTING MRN: [id number] Date of Birth: 1941-06-19

## 2020-12-03 ENCOUNTER — Ambulatory Visit: Payer: Medicare HMO | Admitting: Occupational Therapy

## 2020-12-03 ENCOUNTER — Ambulatory Visit: Payer: Medicare HMO

## 2020-12-05 ENCOUNTER — Ambulatory Visit: Payer: Medicare HMO

## 2020-12-05 ENCOUNTER — Ambulatory Visit: Payer: Medicare HMO | Admitting: Occupational Therapy

## 2020-12-10 ENCOUNTER — Ambulatory Visit: Payer: Medicare HMO | Admitting: Occupational Therapy

## 2020-12-12 ENCOUNTER — Ambulatory Visit: Payer: Medicare HMO | Admitting: Occupational Therapy

## 2020-12-17 ENCOUNTER — Ambulatory Visit: Payer: Medicare HMO | Admitting: Occupational Therapy

## 2020-12-19 ENCOUNTER — Ambulatory Visit: Payer: Medicare HMO | Admitting: Occupational Therapy

## 2020-12-24 ENCOUNTER — Ambulatory Visit: Payer: Medicare HMO | Admitting: Occupational Therapy

## 2020-12-26 ENCOUNTER — Ambulatory Visit: Payer: Medicare HMO | Admitting: Occupational Therapy

## 2020-12-31 ENCOUNTER — Ambulatory Visit: Payer: Medicare HMO | Admitting: Occupational Therapy

## 2021-01-02 ENCOUNTER — Ambulatory Visit: Payer: Medicare HMO | Admitting: Occupational Therapy

## 2021-01-07 ENCOUNTER — Ambulatory Visit: Payer: Medicare HMO | Admitting: Occupational Therapy

## 2021-01-09 ENCOUNTER — Ambulatory Visit: Payer: Medicare HMO | Admitting: Occupational Therapy

## 2021-01-14 ENCOUNTER — Ambulatory Visit: Payer: Medicare HMO | Admitting: Occupational Therapy

## 2021-01-16 ENCOUNTER — Ambulatory Visit: Payer: Medicare HMO | Admitting: Occupational Therapy

## 2021-01-21 ENCOUNTER — Ambulatory Visit: Payer: Medicare HMO | Admitting: Occupational Therapy

## 2021-01-23 ENCOUNTER — Ambulatory Visit: Payer: Medicare HMO | Admitting: Occupational Therapy

## 2021-01-28 ENCOUNTER — Ambulatory Visit: Payer: Medicare HMO | Admitting: Occupational Therapy

## 2021-01-30 ENCOUNTER — Ambulatory Visit: Payer: Medicare HMO | Admitting: Occupational Therapy

## 2021-02-04 ENCOUNTER — Ambulatory Visit: Payer: Medicare HMO | Admitting: Occupational Therapy

## 2021-02-06 ENCOUNTER — Ambulatory Visit: Payer: Medicare HMO | Admitting: Occupational Therapy

## 2021-08-23 ENCOUNTER — Emergency Department: Payer: Medicare Other

## 2021-08-23 ENCOUNTER — Other Ambulatory Visit: Payer: Self-pay

## 2021-08-23 DIAGNOSIS — Y9289 Other specified places as the place of occurrence of the external cause: Secondary | ICD-10-CM | POA: Diagnosis not present

## 2021-08-23 DIAGNOSIS — Z7982 Long term (current) use of aspirin: Secondary | ICD-10-CM | POA: Diagnosis not present

## 2021-08-23 DIAGNOSIS — W01198A Fall on same level from slipping, tripping and stumbling with subsequent striking against other object, initial encounter: Secondary | ICD-10-CM | POA: Diagnosis not present

## 2021-08-23 DIAGNOSIS — S0001XA Abrasion of scalp, initial encounter: Secondary | ICD-10-CM | POA: Diagnosis not present

## 2021-08-23 DIAGNOSIS — S0990XA Unspecified injury of head, initial encounter: Secondary | ICD-10-CM | POA: Diagnosis present

## 2021-08-23 DIAGNOSIS — Y9301 Activity, walking, marching and hiking: Secondary | ICD-10-CM | POA: Insufficient documentation

## 2021-08-23 IMAGING — CT CT CERVICAL SPINE W/O CM
3 of 4 series · 12 of 35 positions shown, 14 images · non-contrast
Comparison: CT brain [DATE], CT brain and cervical spine
[DATE]

CLINICAL DATA: Fell and hit posterior head

EXAM:
CT HEAD WITHOUT CONTRAST
CT CERVICAL SPINE WITHOUT CONTRAST
TECHNIQUE: Multidetector CT imaging of the head and cervical spine was
performed following the standard protocol without intravenous
contrast. Multiplanar CT image reconstructions of the cervical spine
were also generated.

[Series 4: sagittal bone · sagittal · 0.45mm/px · 5 of 92 slices shown, 6 images]
[im 31/92  bone]
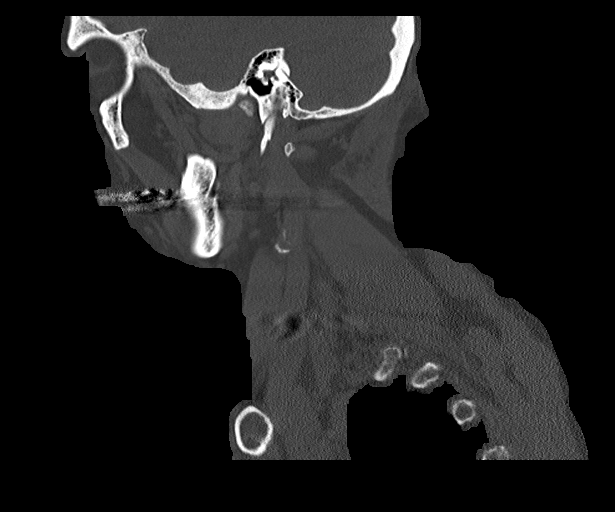
[im 38/92  bone]
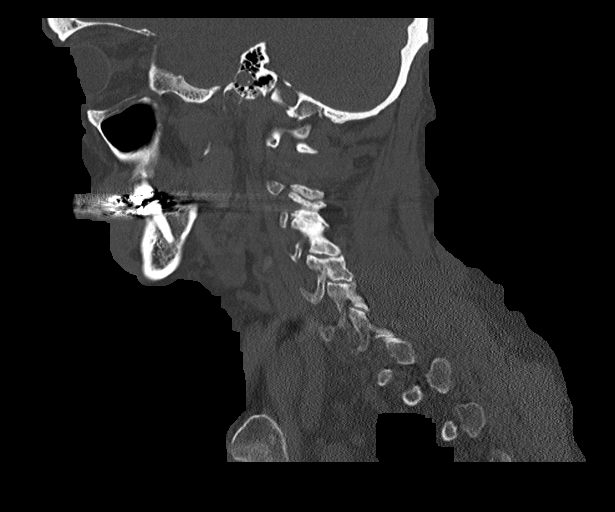
[im 46/92  soft-tissue]
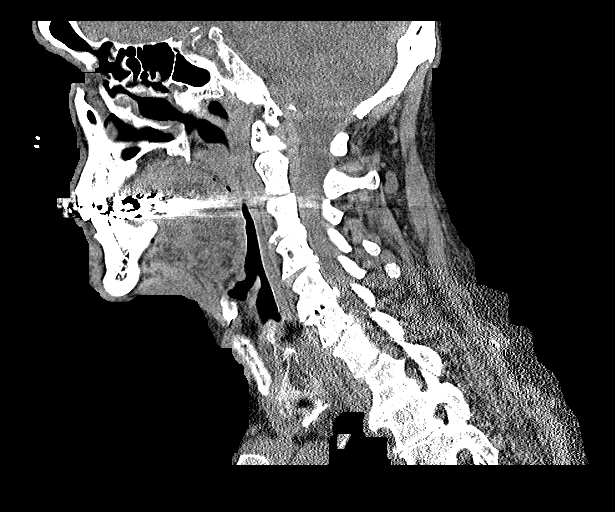
[im 46/92  bone]
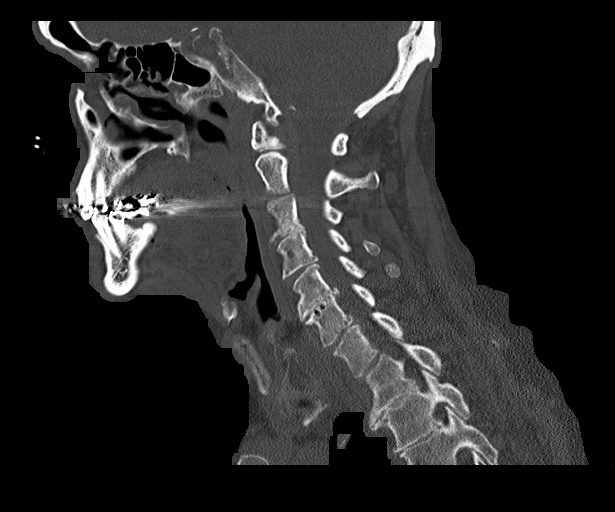
[im 54/92  bone]
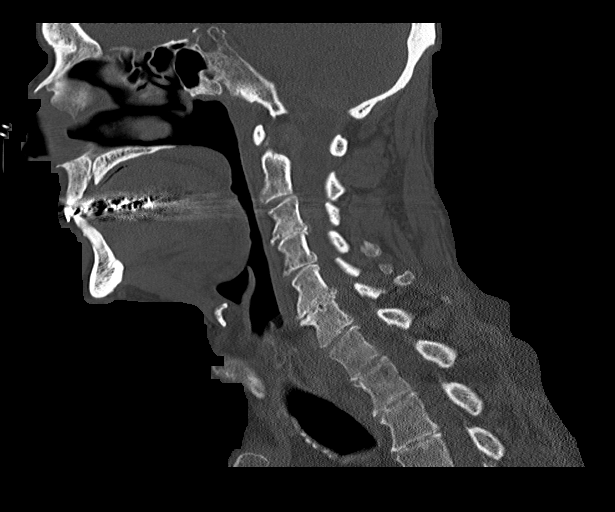
[im 61/92  bone]
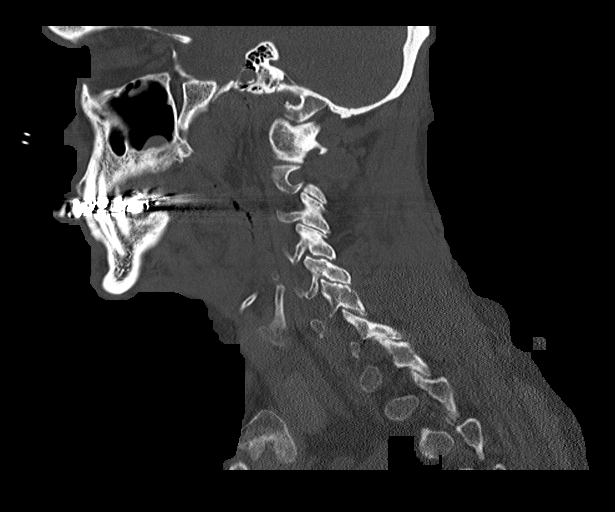

[Series 5: coronal bone · coronal · 0.42mm/px · 3 of 103 slices shown]
[im 37/103  bone]
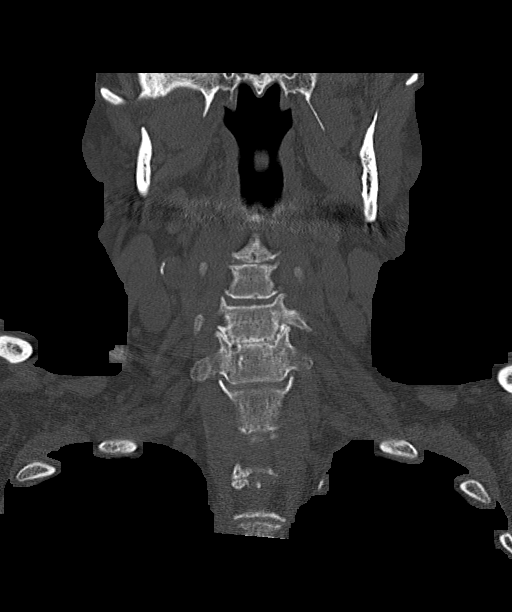
[im 47/103  bone]
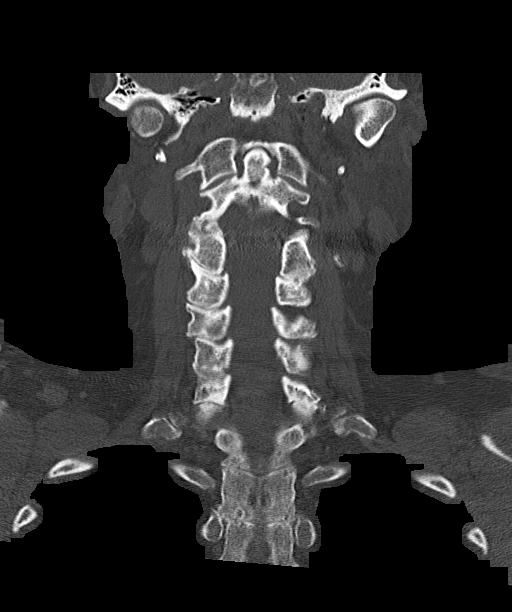
[im 57/103  bone]
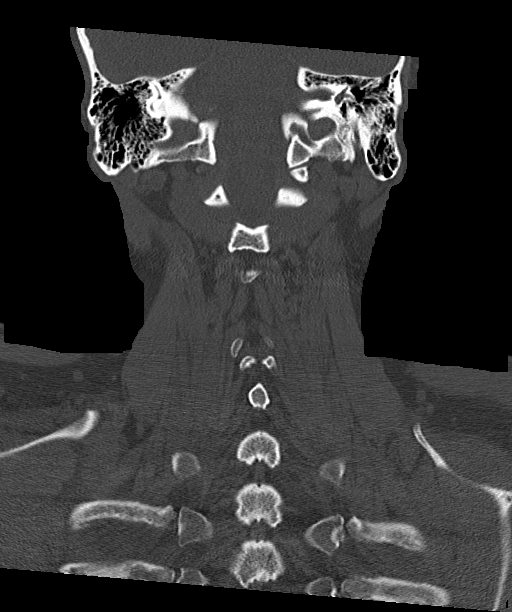

[Series 6: orthogonal bone · axial · 0.41mm/px · z∈[-325,-168]mm · 4 of 127 slices shown, 5 images]
[im 19/127  soft-tissue]
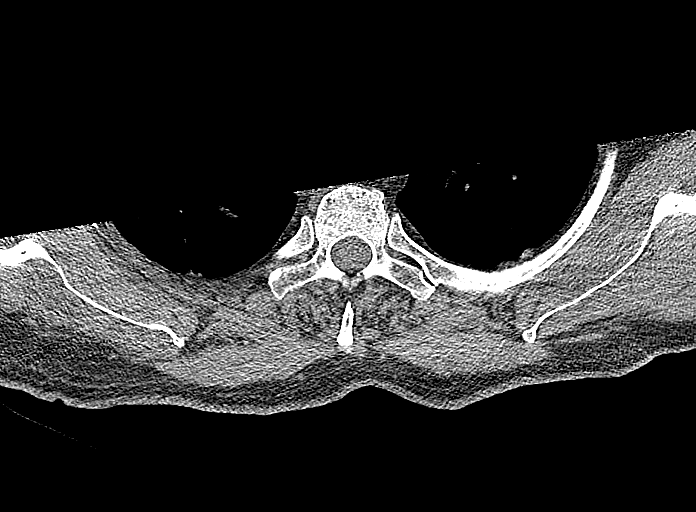
[im 19/127  bone]
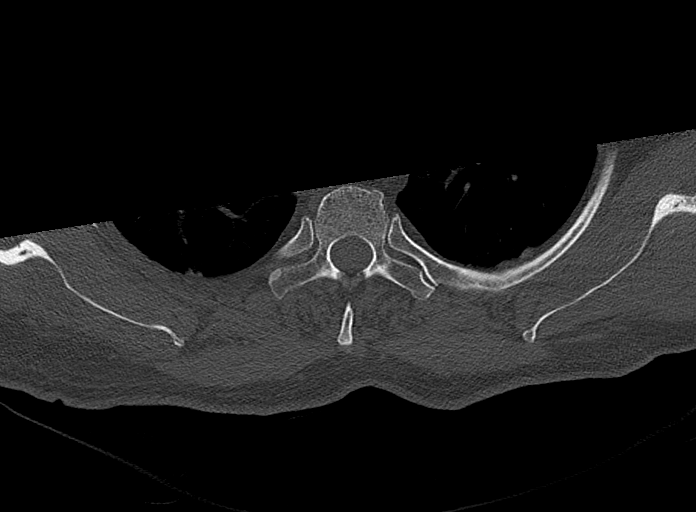
[im 55/127  bone]
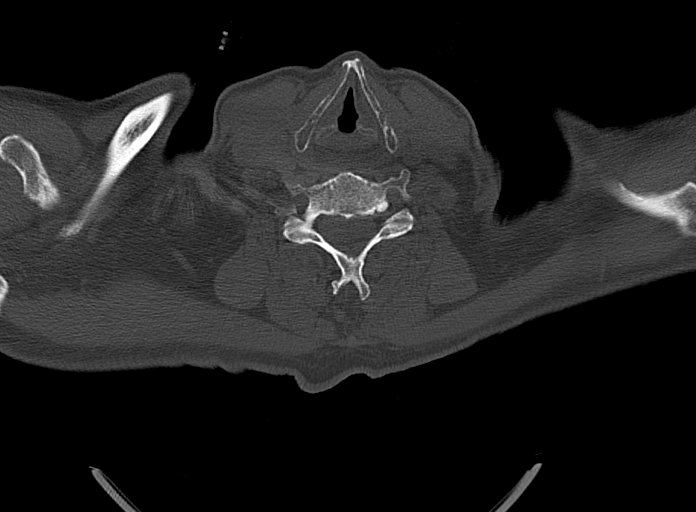
[im 73/127  bone]
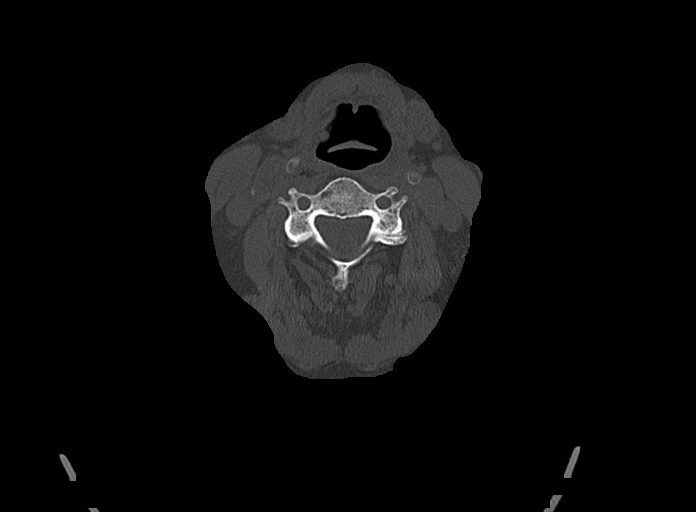
[im 109/127  bone]
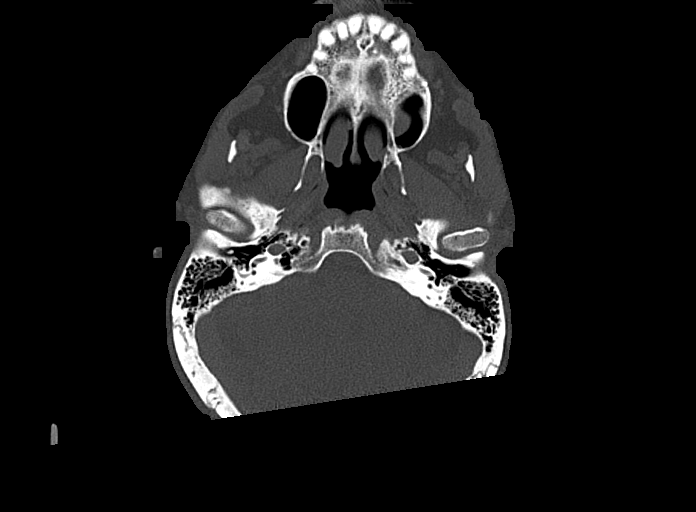

[12 of 35 positions shown; findings below may reference images not displayed]

FINDINGS: CT HEAD FINDINGS

Brain: No acute territorial infarction, hemorrhage or intracranial
mass. Small chronic right frontal infarct. Atrophy and chronic small
vessel ischemic changes of the white matter. Stable ventricle size.

Vascular: No hyperdense vessels.  Carotid vascular calcification

Skull: Normal. Negative for fracture or focal lesion.

Sinuses/Orbits: No acute finding.

Other: Small left posterior scalp hematoma at the cranial vertex

CT CERVICAL SPINE FINDINGS

Alignment: No subluxation.  Facet alignment within normal limits.

Skull base and vertebrae: No acute fracture. No primary bone lesion
or focal pathologic process.

Soft tissues and spinal canal: No prevertebral fluid or swelling. No
visible canal hematoma.

Disc levels: Advanced disc space narrowing and degenerative change
C3-C4, C4-C5 and C5-C6 with moderate severe disease at C6-C7. Facet
degenerative changes at multiple levels with bilateral foraminal
stenosis.

Upper chest: Negative.

Other: None
IMPRESSION: 1. No CT evidence for acute intracranial abnormality. Chronic right
frontal lobe infarct. Atrophy and chronic small vessel ischemic
changes of the white matter.
1. Degenerative changes of the cervical spine. No acute osseous
abnormality.

## 2021-08-23 NOTE — ED Triage Notes (Signed)
Pt states he was walking when he fell striking posterior head. Pt with laceration to posterior skull. Pt denies hip pain, arm pain, neck pain. Pt unsure if loc occurred.

## 2021-08-24 ENCOUNTER — Emergency Department
Admission: EM | Admit: 2021-08-24 | Discharge: 2021-08-24 | Disposition: A | Discharge status: Home / Self Care | Payer: Medicare Other | Attending: Emergency Medicine | Admitting: Emergency Medicine

## 2021-08-24 DIAGNOSIS — W19XXXA Unspecified fall, initial encounter: Secondary | ICD-10-CM

## 2021-08-24 DIAGNOSIS — S0990XA Unspecified injury of head, initial encounter: Secondary | ICD-10-CM

## 2021-08-24 NOTE — ED Notes (Signed)
Pt's caregiver requesting information for accessing my chart. Given access code.

## 2021-08-24 NOTE — ED Provider Notes (Signed)
[facility] Emergency Department Provider Note  Time seen: 8:23 AM  I have reviewed the triage vital signs and the nursing notes.   HISTORY  Chief Complaint Fall   HPI Edward Campbell is a 80 y.o. male with a past medical history of prior CVA, uses a walker to ambulate, presents to the emergency department after a fall.  According to the patient he was using his walker when he got away from him and the patient fell backwards hitting the back of his head on the ground.  Not sure if he passed out or not.  Denies any nausea or vomiting.  Denies any other injuries.  Patient has been ambulatory without issue since the fall.  Did have blood to the back of the head upon arrival.   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 Authorizing Provider  amLODipine (NORVASC) 5 MG tablet Take 5 mg by mouth daily. 03/21/20   [provider]  aspirin 81 MG EC tablet Take 1 tablet (81 mg total) by mouth daily at 6 (six) AM. HOLD aspirin for 7 days and resume it after discussing with PCP. 07/02/20   Kathlen Mody, MD  atorvastatin (LIPITOR) 40 MG tablet Take 40 mg by mouth at bedtime. 05/14/20   [provider]  Dextran 70-Hypromellose 0.1-0.3 % SOLN Apply 1 drop to eye every 6 (six) hours as needed for dry eyes.    [provider]  ELIQUIS 5 MG TABS tablet Take 1 tablet (5 mg total) by mouth every  12 (twelve) hours. Hold Eliquis for one week. Restart the medication after discussing with PCP. Patient not taking: Reported on 07/10/2020 07/02/20   Kathlen Mody, MD  finasteride (PROSCAR) 5 MG tablet Take 5 mg by mouth daily. 05/28/20   [provider]  levETIRAcetam (KEPPRA) 500 MG tablet Take 1 tablet (500 mg total) by mouth 2 (two) times daily for 7 days. 06/25/20 07/02/20  Kathlen Mody, MD  tamsulosin (FLOMAX) 0.4 MG CAPS capsule Take 0.4 mg by mouth daily. 05/03/20   [provider]    Allergies  Allergen Reactions   Albumin Human Other (See Comments)    Refuses all blood products as one of [employer]    No family history on file.  Social History Social History   Tobacco Use   Smoking status: Never   Smokeless tobacco: Never  Substance Use Topics   Alcohol use: Yes    Comment: wine     Review of Systems Constitutional: Unclear LOC Cardiovascular: Negative for chest pain. Respiratory: Negative for shortness of breath. Gastrointestinal: Negative for abdominal pain, vomiting  Musculoskeletal: Negative for musculoskeletal complaints Skin: Dried blood to the back of the scalp Neurological: Negative for headache.  Denies weakness or numbness. All other ROS negative  ____________________________________________   PHYSICAL EXAM:  VITAL SIGNS: ED Triage Vitals  Enc Vitals Group  BP 08/23/21 1911 (!) 161/85     Pulse Rate 08/23/21 1910 76     Resp 08/23/21 1910 14     Temp 08/23/21 1910 98.2 F (36.8 C)     Temp Source 08/23/21 1910 Oral     SpO2 08/23/21 1910 95 %     Weight 08/23/21 1910 165 lb (74.8 kg)     Height 08/23/21 1910 5\' 8"  (1.727 m)     Head Circumference --      Peak Flow --      Pain Score --      Pain Loc --      Pain Edu? --      Excl. in GC? --     Constitutional: Awake alert, no distress.  No acute complaints. Eyes: Normal exam ENT      Head: Small abrasion to occipital scalp, hemostatic.      Mouth/Throat:  Mucous membranes are moist. Cardiovascular: Normal rate, regular rhythm.  Respiratory: Normal respiratory effort without tachypnea nor retractions. Breath sounds are clear  Gastrointestinal: Soft and nontender. No distention.   Musculoskeletal: Nontender with normal range of motion in all extremities.  Great range of motion all extremities. Neurologic:  Normal speech and language. No gross focal neurologic deficits Skin: Abrasion to occipital scalp that is now hemostatic.  Small bruise to the left palm. Psychiatric: Mood and affect are normal.   ____________________________________________       RADIOLOGY  CT imaging the head and the C-spine are negative for acute abnormality.  ____________________________________________   INITIAL IMPRESSION / ASSESSMENT AND PLAN / ED COURSE  Pertinent labs & imaging results that were available during my care of the patient were reviewed by me and considered in my medical decision making (see chart for details).   Patient presents emergency department after a fall, head injury.  Patient does have an abrasion to the occipital scalp but is now hemostatic.  CT scans are reassuring.  Patient appears well, great range of motion in all extremities.  Has been ambulatory with his walker since the fall.  We will discharge the patient home.  Patient and caregiver agreeable to plan of care.  Edward Campbell was evaluated in Emergency Department on 08/24/2021 for the symptoms described in the history of present illness. He was evaluated in the context of the global COVID-19 pandemic, which necessitated consideration that the patient might be at risk for infection with the SARS-CoV-2 virus that causes COVID-19. Institutional protocols and algorithms that pertain to the evaluation of patients at risk for COVID-19 are in a state of rapid change based on information released by regulatory bodies including the CDC and federal and state organizations. These policies and  algorithms were followed during the patient's care in the ED.  ____________________________________________   FINAL CLINICAL IMPRESSION(S) / ED DIAGNOSES  Fall Head injury   08/26/2021, MD 08/24/21 [phone]

## 2021-10-27 ENCOUNTER — Other Ambulatory Visit: Payer: Self-pay

## 2021-10-27 ENCOUNTER — Emergency Department: Payer: Medicare Other

## 2021-10-27 ENCOUNTER — Encounter: Payer: Self-pay | Admitting: Emergency Medicine

## 2021-10-27 ENCOUNTER — Observation Stay: Payer: Medicare Other

## 2021-10-27 ENCOUNTER — Observation Stay
Admission: EM | Admit: 2021-10-27 | Discharge: 2021-10-28 | Disposition: A | Discharge status: Home / Self Care | Payer: Medicare Other | Attending: Internal Medicine | Admitting: Internal Medicine

## 2021-10-27 DIAGNOSIS — Z7901 Long term (current) use of anticoagulants: Secondary | ICD-10-CM | POA: Insufficient documentation

## 2021-10-27 DIAGNOSIS — A0839 Other viral enteritis: Secondary | ICD-10-CM

## 2021-10-27 DIAGNOSIS — R269 Unspecified abnormalities of gait and mobility: Secondary | ICD-10-CM | POA: Diagnosis not present

## 2021-10-27 DIAGNOSIS — I10 Essential (primary) hypertension: Secondary | ICD-10-CM

## 2021-10-27 DIAGNOSIS — R531 Weakness: Secondary | ICD-10-CM | POA: Diagnosis present

## 2021-10-27 DIAGNOSIS — E7800 Pure hypercholesterolemia, unspecified: Secondary | ICD-10-CM | POA: Diagnosis not present

## 2021-10-27 DIAGNOSIS — Z7982 Long term (current) use of aspirin: Secondary | ICD-10-CM | POA: Diagnosis not present

## 2021-10-27 DIAGNOSIS — R2689 Other abnormalities of gait and mobility: Secondary | ICD-10-CM | POA: Diagnosis not present

## 2021-10-27 DIAGNOSIS — Z79899 Other long term (current) drug therapy: Secondary | ICD-10-CM | POA: Insufficient documentation

## 2021-10-27 DIAGNOSIS — U071 COVID-19: Secondary | ICD-10-CM | POA: Diagnosis not present

## 2021-10-27 DIAGNOSIS — I639 Cerebral infarction, unspecified: Secondary | ICD-10-CM | POA: Diagnosis not present

## 2021-10-27 DIAGNOSIS — R2681 Unsteadiness on feet: Secondary | ICD-10-CM

## 2021-10-27 HISTORY — DX: Essential (primary) hypertension: I10

## 2021-10-27 HISTORY — DX: Hyperlipidemia, unspecified: E78.5

## 2021-10-27 LAB — URINE DRUG SCREEN, QUALITATIVE (ARMC ONLY)
Amphetamines, Ur Screen: NOT DETECTED
Barbiturates, Ur Screen: NOT DETECTED
Benzodiazepine, Ur Scrn: NOT DETECTED
Cannabinoid 50 Ng, Ur ~~LOC~~: NOT DETECTED
Cocaine Metabolite,Ur ~~LOC~~: NOT DETECTED
MDMA (Ecstasy)Ur Screen: NOT DETECTED
Methadone Scn, Ur: NOT DETECTED
Opiate, Ur Screen: NOT DETECTED
Phencyclidine (PCP) Ur S: NOT DETECTED
Tricyclic, Ur Screen: NOT DETECTED

## 2021-10-27 LAB — URINALYSIS, ROUTINE W REFLEX MICROSCOPIC
Bilirubin Urine: NEGATIVE
Glucose, UA: NEGATIVE mg/dL
Hgb urine dipstick: NEGATIVE
Ketones, ur: 5 mg/dL — AB
Leukocytes,Ua: NEGATIVE
Nitrite: NEGATIVE
Protein, ur: 30 mg/dL — AB
Specific Gravity, Urine: 1.025 (ref 1.005–1.030)
pH: 5 (ref 5.0–8.0)

## 2021-10-27 LAB — CBC
HCT: 40.2 % (ref 39.0–52.0)
Hemoglobin: 13.5 g/dL (ref 13.0–17.0)
MCH: 32.3 pg (ref 26.0–34.0)
MCHC: 33.6 g/dL (ref 30.0–36.0)
MCV: 96.2 fL (ref 80.0–100.0)
Platelets: 256 10*3/uL (ref 150–400)
RBC: 4.18 MIL/uL — ABNORMAL LOW (ref 4.22–5.81)
RDW: 12.2 % (ref 11.5–15.5)
WBC: 7.6 10*3/uL (ref 4.0–10.5)
nRBC: 0 % (ref 0.0–0.2)

## 2021-10-27 LAB — BASIC METABOLIC PANEL
Anion gap: 7 (ref 5–15)
BUN: 18 mg/dL (ref 8–23)
CO2: 24 mmol/L (ref 22–32)
Calcium: 8.7 mg/dL — ABNORMAL LOW (ref 8.9–10.3)
Chloride: 103 mmol/L (ref 98–111)
Creatinine, Ser: 1.12 mg/dL (ref 0.61–1.24)
GFR, Estimated: >60 mL/min (ref >60)
Glucose, Bld: 101 mg/dL — ABNORMAL HIGH (ref 70–99)
Potassium: 3.8 mmol/L (ref 3.5–5.1)
Sodium: 134 mmol/L — ABNORMAL LOW (ref 135–145)

## 2021-10-27 LAB — RESP PANEL BY RT-PCR (FLU A&B, COVID) ARPGX2
Influenza A by PCR: NEGATIVE
Influenza B by PCR: NEGATIVE
SARS Coronavirus 2 by RT PCR: POSITIVE — AB

## 2021-10-27 LAB — MAGNESIUM: Magnesium: 2.5 mg/dL — ABNORMAL HIGH (ref 1.7–2.4)

## 2021-10-27 LAB — TROPONIN I (HIGH SENSITIVITY): Troponin I (High Sensitivity): 5 ng/L (ref <18)

## 2021-10-27 IMAGING — CT CT HEAD W/O CM
4 series · 17 of 47 positions shown, 19 images · non-contrast
Comparison: [DATE]

CLINICAL DATA: Loss of balance, neurologic deficit

EXAM:
CT HEAD WITHOUT CONTRAST
TECHNIQUE: Contiguous axial images were obtained from the base of the skull
through the vertex without intravenous contrast.

[Series 2: head wo · axial · 0.46mm/px · z∈[-84,+41]mm · 7 of 35 slices shown, 9 images]
[im 5/35  brain]
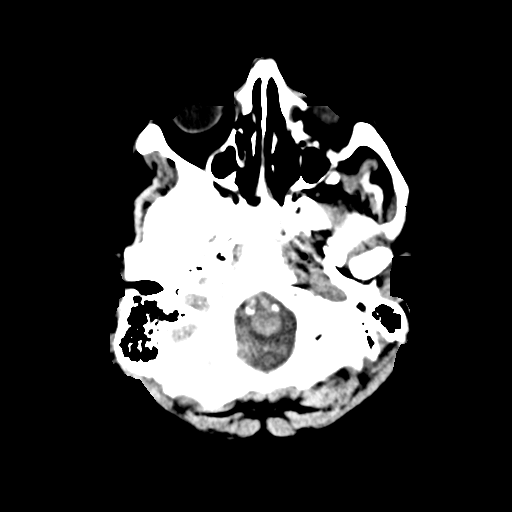
[im 5/35  bone]
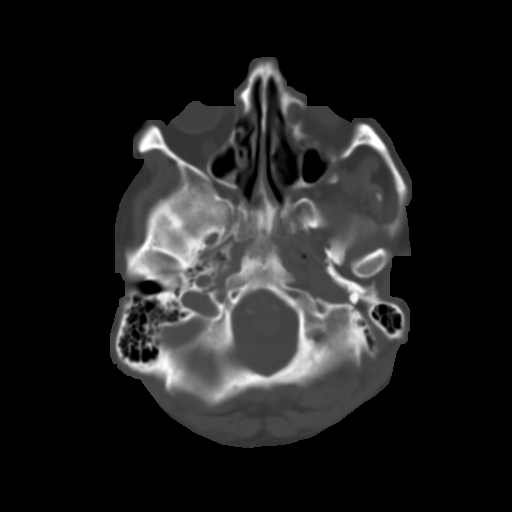
[im 9/35  brain]
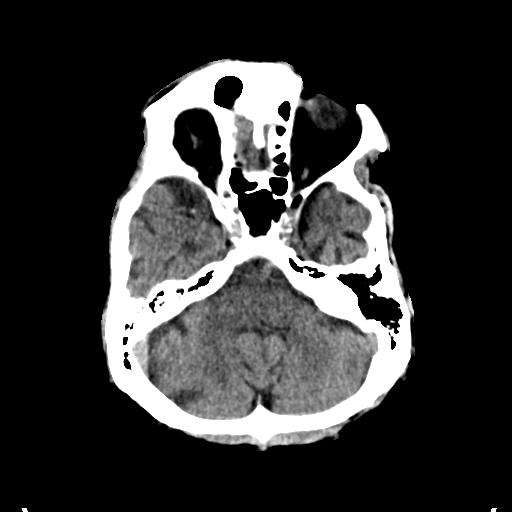
[im 13/35  brain]
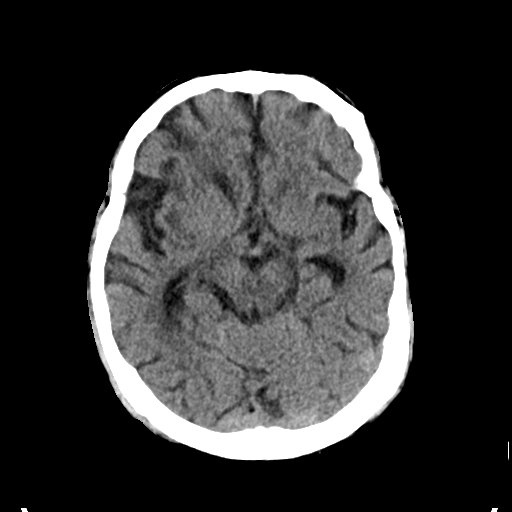
[im 18/35  brain]
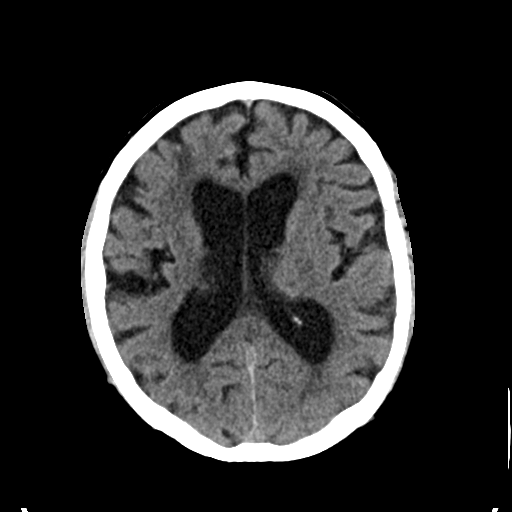
[im 22/35  brain]
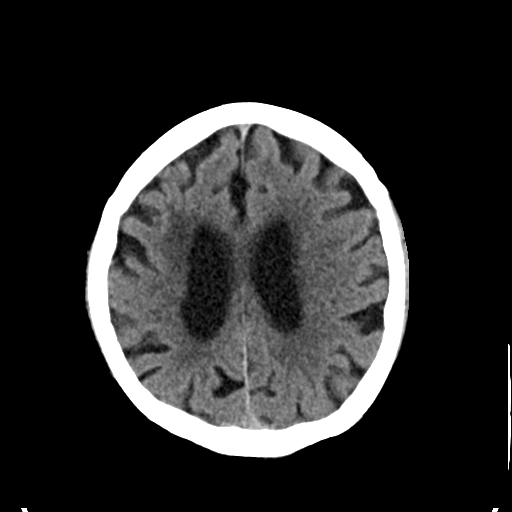
[im 22/35  bone]
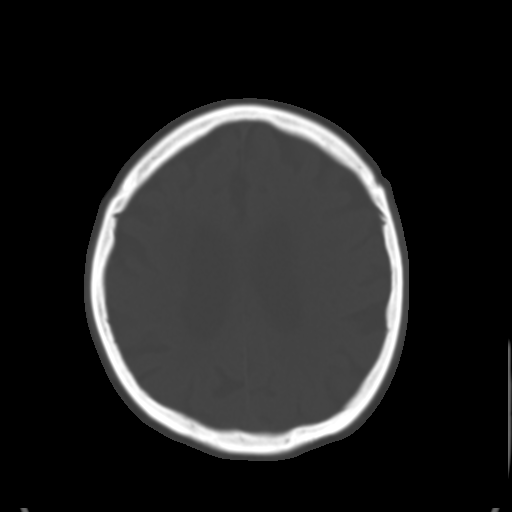
[im 26/35  brain]
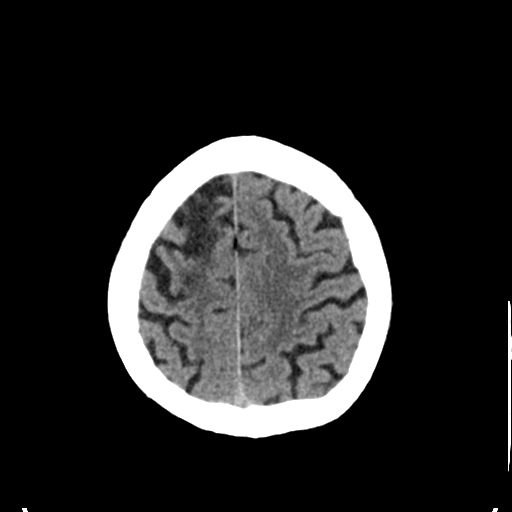
[im 30/35  brain]
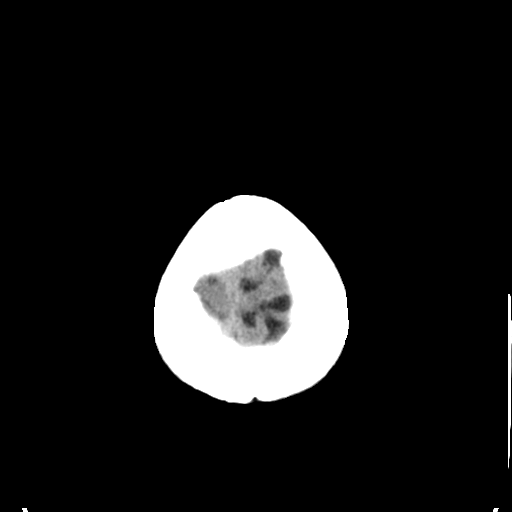

[Series 3: head bone · axial · 0.46mm/px · z∈[-88,-28]mm · 4 of 87 slices shown]
[im 9/87  bone]
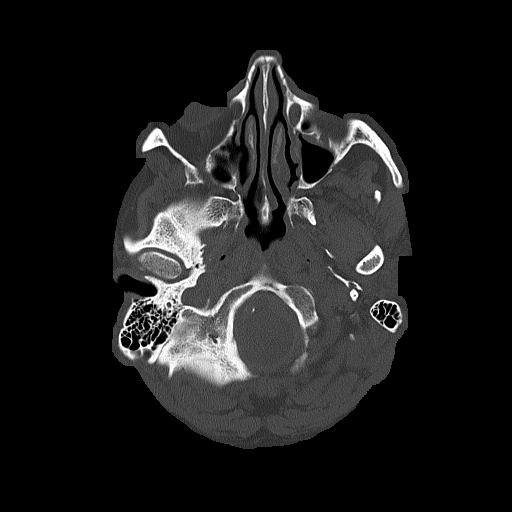
[im 18/87  bone]
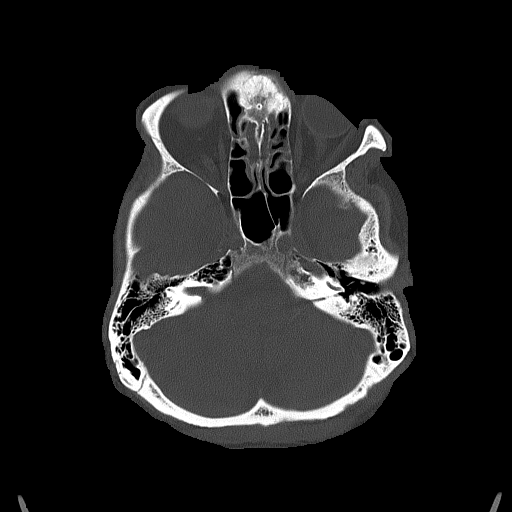
[im 26/87  bone]
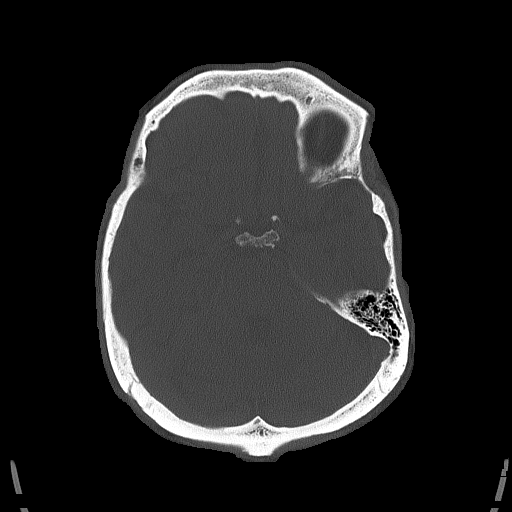
[im 39/87  bone]
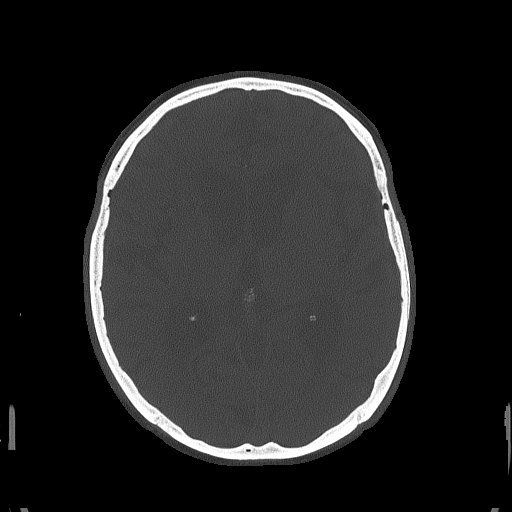

[Series 4: coronal soft tissue · coronal · 0.34mm/px · 3 of 71 slices shown]
[im 24/71  brain]
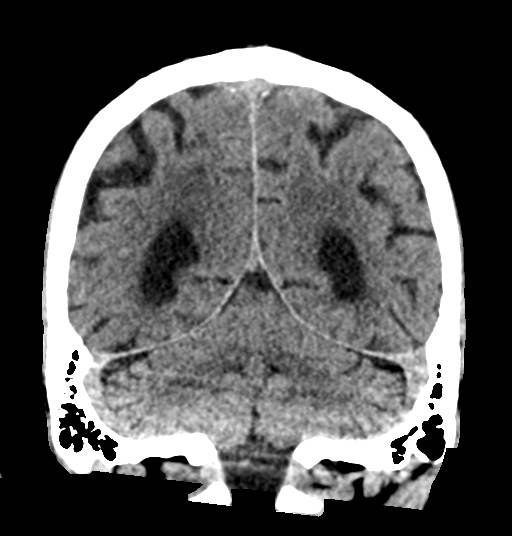
[im 32/71  brain]
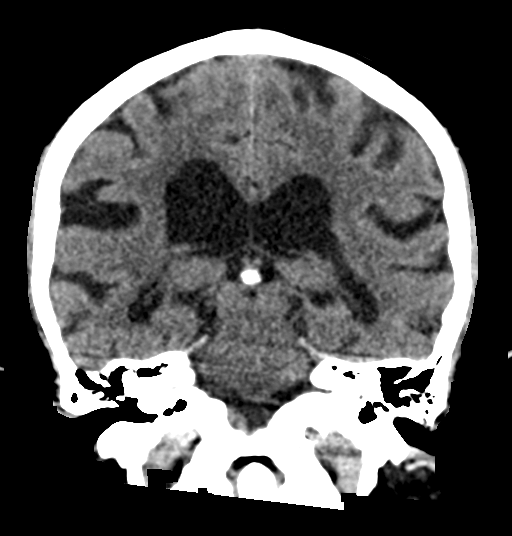
[im 39/71  brain]
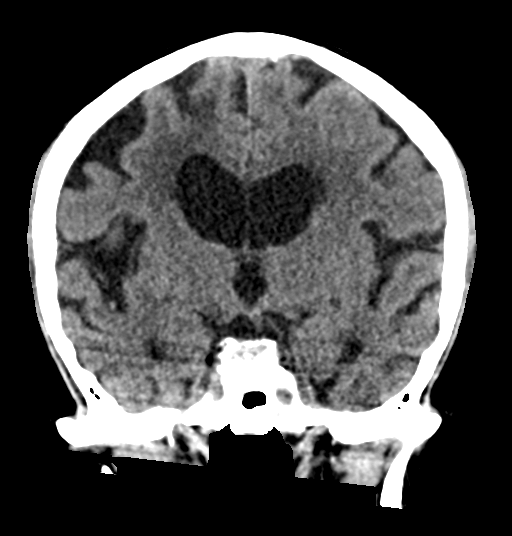

[Series 5: sagittal soft tissue · sagittal · 0.35mm/px · 3 of 57 slices shown]
[im 19/57  brain]
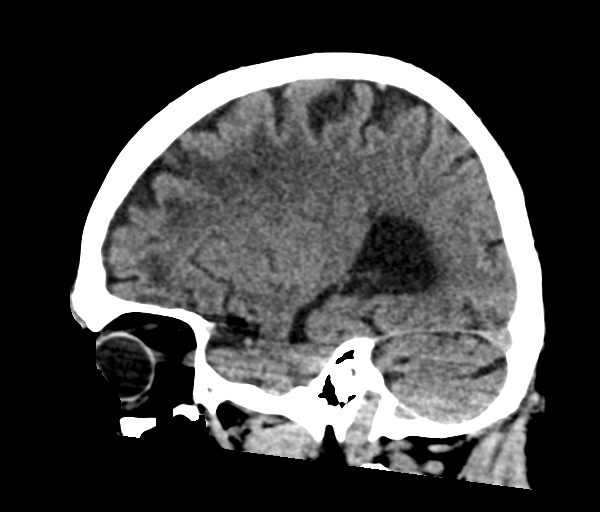
[im 29/57  brain]
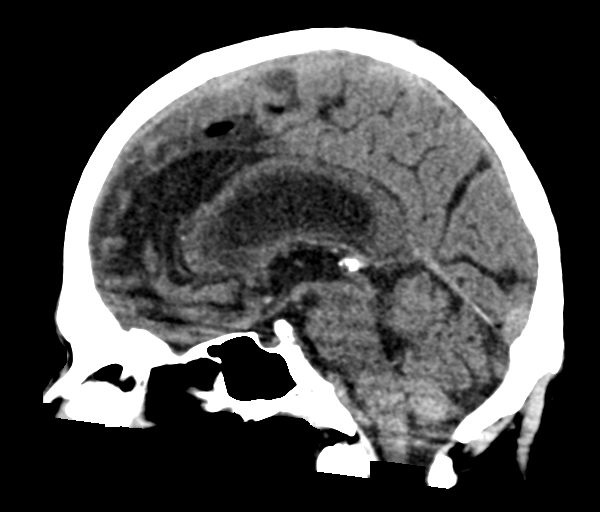
[im 38/57  brain]
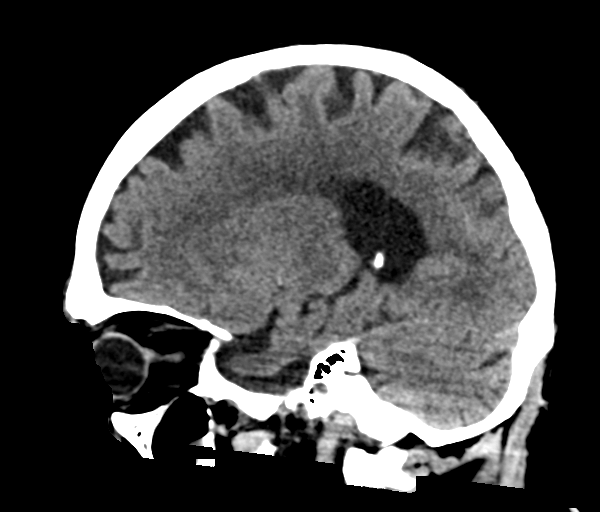

[17 of 47 positions shown; findings below may reference images not displayed]

FINDINGS: Brain: Chronic right frontal cortical infarct is stable. No evidence
of acute infarct or hemorrhage. Lateral ventricles and midline
structures are unremarkable. No acute extra-axial fluid collections.
No mass effect.

Vascular: No hyperdense vessel or unexpected calcification.

Skull: Normal. Negative for fracture or focal lesion.

Sinuses/Orbits: Mucosal thickening throughout the bilateral
maxillary and ethmoid sinuses. Remaining paranasal sinuses are
clear.

Other: None.
IMPRESSION: 1. No acute intracranial process. Stable chronic right frontal
cortical infarct.

## 2021-10-27 IMAGING — MR MR HEAD W/O CM
13 series · 48 of 48 positions shown · non-contrast
Comparison: Same-day noncontrast head CT

CLINICAL DATA: Neuro deficit, leg weakness

EXAM:
MRI HEAD WITHOUT CONTRAST
TECHNIQUE: Multiplanar, multiecho pulse sequences of the brain and surrounding
structures were obtained without intravenous contrast.

[Series 5: ax dwi_tracew · axial · 3.0mm · 0.65mm/px · z∈[-62,+92]mm · 2 of 48 slices shown]
[im 1/48]
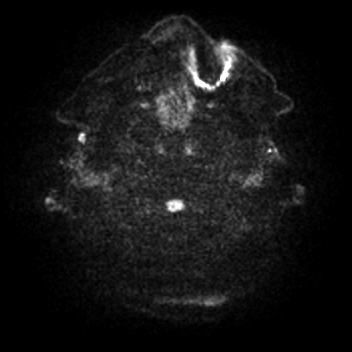
[im 48/48]
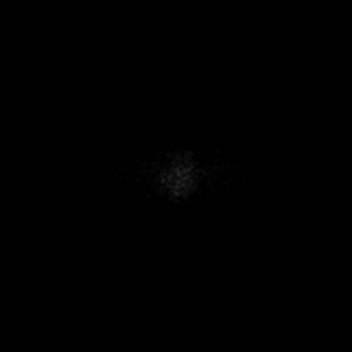

[Series 6: ax dwi_adc · axial · 3.0mm · 0.65mm/px · z∈[-62,+89]mm · 3 of 47 slices shown]
[im 1/47]
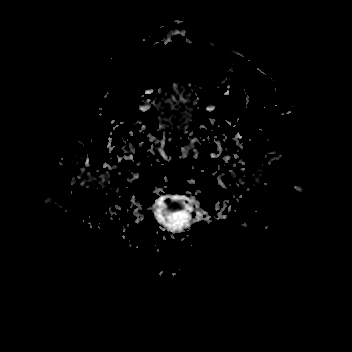
[im 24/47]
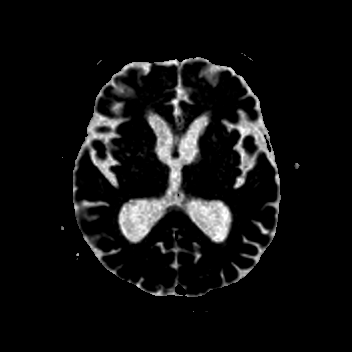
[im 47/47]
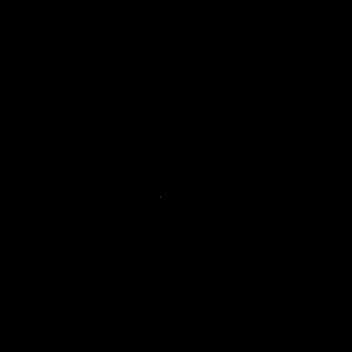

[Series 7: cor dwi_tracew · coronal · 5.0mm · 0.68mm/px · 3 of 39 slices shown]
[im 1/39]
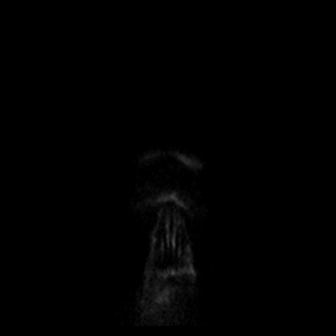
[im 20/39]
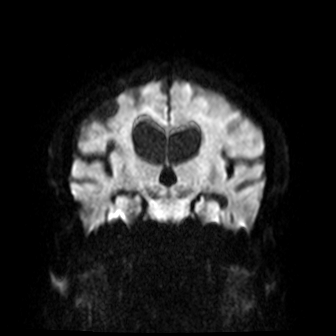
[im 39/39]
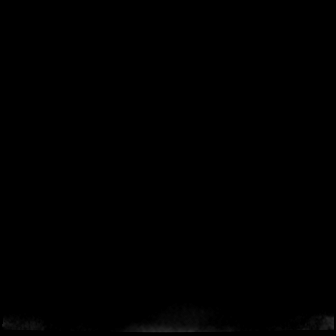

[Series 8: cor dwi_adc · coronal · 5.0mm · 0.68mm/px · 3 of 39 slices shown]
[im 1/39]
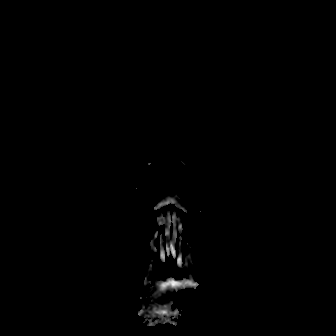
[im 20/39]
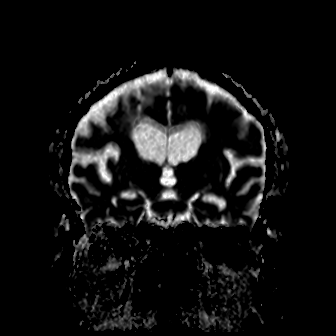
[im 39/39]
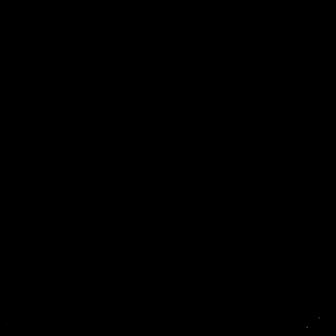

[Series 9: T1 · sagittal · 5.0mm · 0.47mm/px · 2 of 23 slices shown (1 of 2)]
[im 1/23]
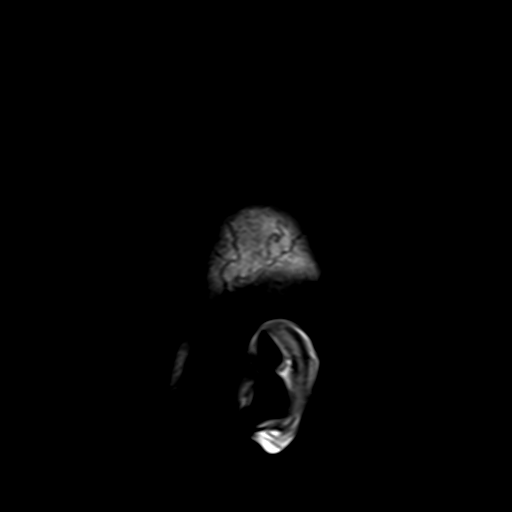
[im 23/23]
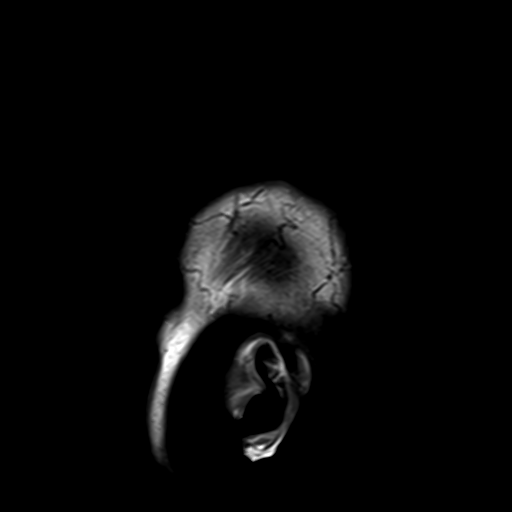

[Series 10: T2 · axial · 5.0mm · 0.86mm/px · z∈[-55,+88]mm · 2 of 25 slices shown (1 of 2)]
[im 1/25]
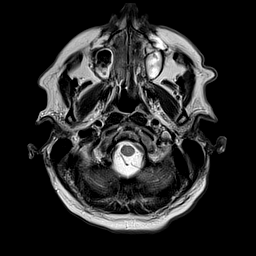
[im 25/25]
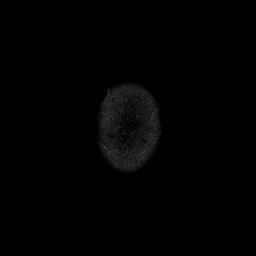

[Series 11: mag_images · axial · 3.0mm · 0.90mm/px · z∈[-60,+92]mm · 4 of 52 slices shown]
[im 1/52]
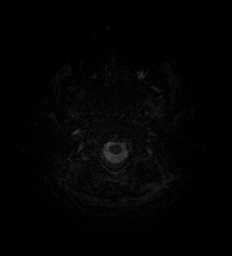
[im 18/52]
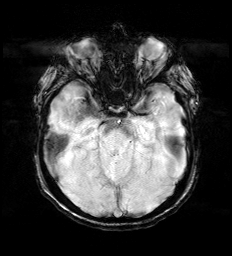
[im 35/52]
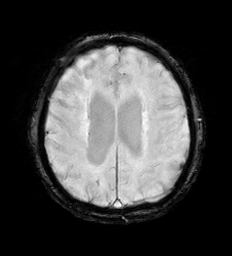
[im 52/52]
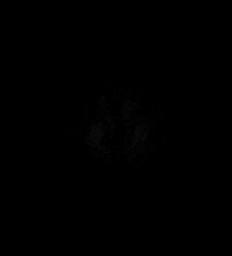

[Series 12: pha_images · axial · 3.0mm · 0.90mm/px · z∈[-60,+92]mm · 4 of 52 slices shown]
[im 1/52]
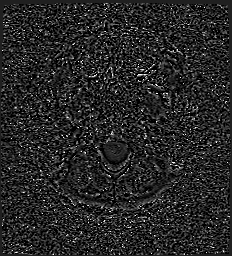
[im 18/52]
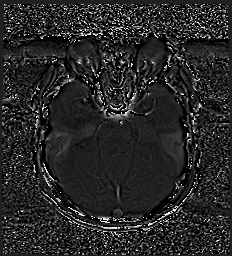
[im 35/52]
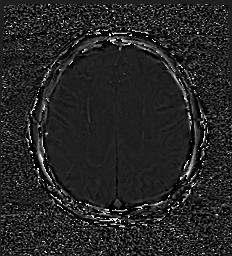
[im 52/52]
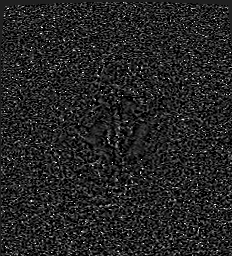

[Series 13: swi_images · axial · 3.0mm · 0.90mm/px · z∈[-60,+92]mm · 4 of 52 slices shown]
[im 1/52]
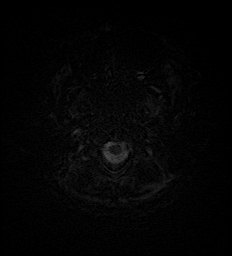
[im 18/52]
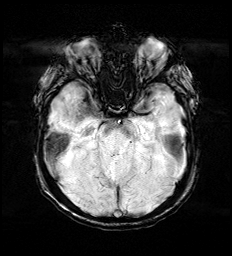
[im 35/52]
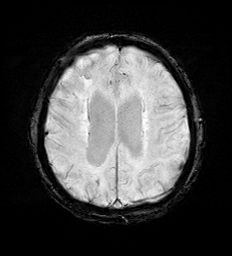
[im 52/52]
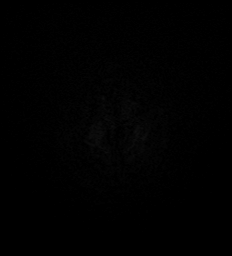

[Series 14: mip_images(sw) · axial · 24.0mm · 0.90mm/px · z∈[-50,+81]mm · 3 of 45 slices shown]
[im 1/45]
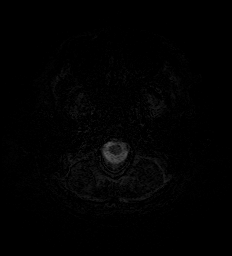
[im 23/45]
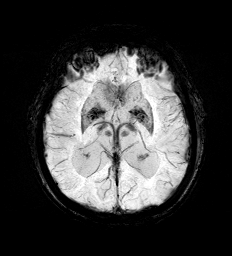
[im 45/45]
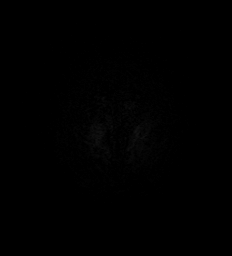

[Series 15: FLAIR · axial · 3.0mm · 0.69mm/px · z∈[-64,+97]mm · 4 of 55 slices shown]
[im 1/55]
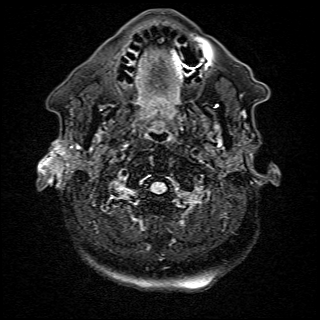
[im 19/55]
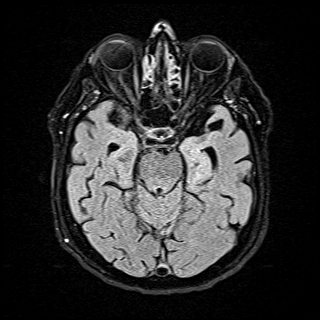
[im 37/55]
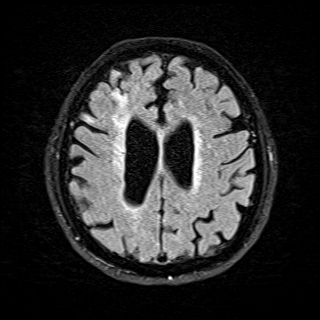
[im 55/55]
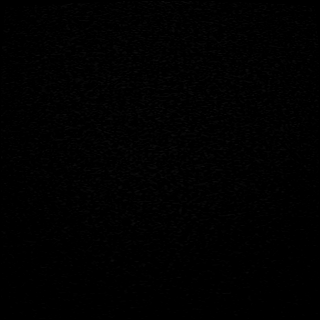

[Series 16: T1 · axial · 1.0mm · 0.98mm/px · z∈[-58,+100]mm · 12 of 160 slices shown (2 of 2)]
[im 1/160]
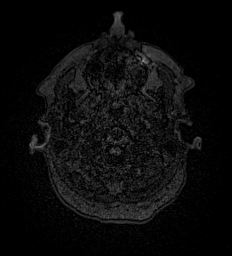
[im 15/160]
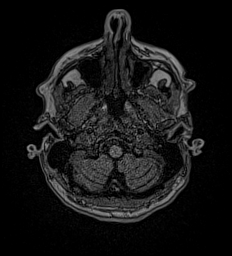
[im 29/160]
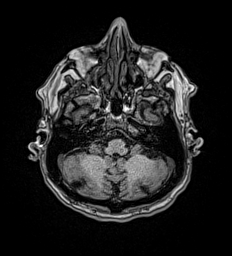
[im 44/160]
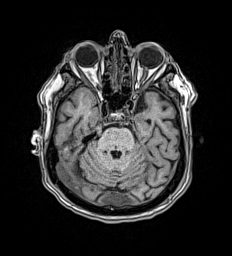
[im 58/160]
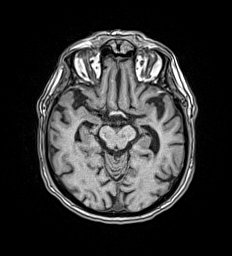
[im 73/160]
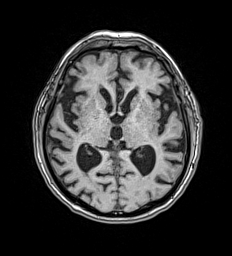
[im 87/160]
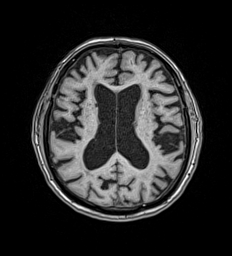
[im 102/160]
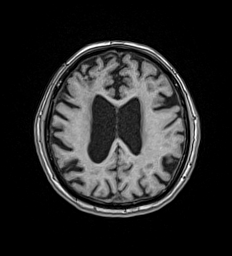
[im 116/160]
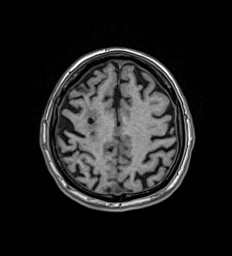
[im 131/160]
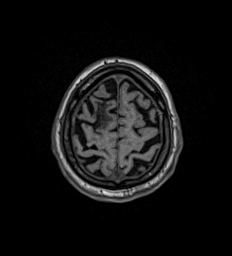
[im 145/160]
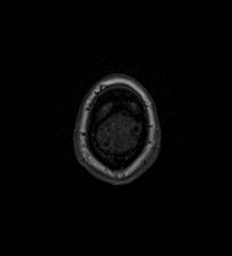
[im 160/160]
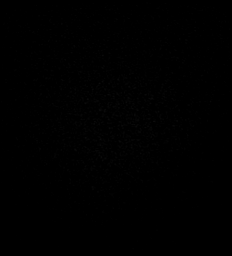

[Series 17: T2 · coronal · 5.0mm · 0.86mm/px · 2 of 30 slices shown (2 of 2)]
[im 1/30]
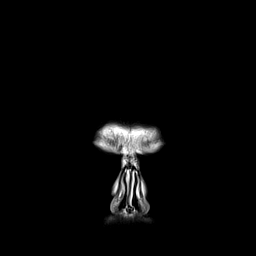
[im 30/30]
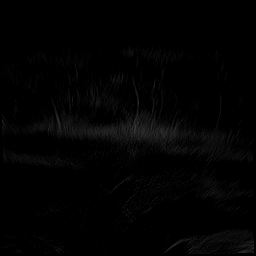

[48 of 48 positions shown; findings below may reference images not displayed]

FINDINGS: Brain: There is a small curvilinear focus of diffusion restriction
in the right centrum semiovale consistent with acute ischemia. There
is no evidence of large vessel territorial infarct.

Remote infarcts in the right frontal lobe are unchanged. Additional
small area of encephalomalacia is seen in the right temporal lobe.
Moderate global parenchymal volume loss with enlargement of the
ventricular system and extra-axial CSF spaces is again seen. Patchy
FLAIR signal abnormality in the remainder of the subcortical and
periventricular white matter likely reflects sequela of chronic
white matter microangiopathy.

There is no solid mass lesion.  There is no midline shift.

Vascular: Normal flow voids.

Skull and upper cervical spine: Normal marrow signal.

Sinuses/Orbits: There is mild mucosal thickening in the paranasal
sinuses. Bilateral lens implants are in place. The globes and orbits
are otherwise unremarkable.

Other: None.
IMPRESSION: 1. Small curvilinear focus of diffusion restriction in the right
centrum semiovale consistent with acute ischemia. No other evidence
of acute intracranial hemorrhage or infarct.
2. Unchanged remote infarcts, global parenchymal volume loss, and
chronic white matter microangiopathy as above.

## 2021-10-27 IMAGING — CR DG CHEST 2V
2 series · 2 of 2 positions shown · non-contrast
Comparison: None.

CLINICAL DATA: Weakness, loss of balance

EXAM:
CHEST - 2 VIEW

[chest pa]
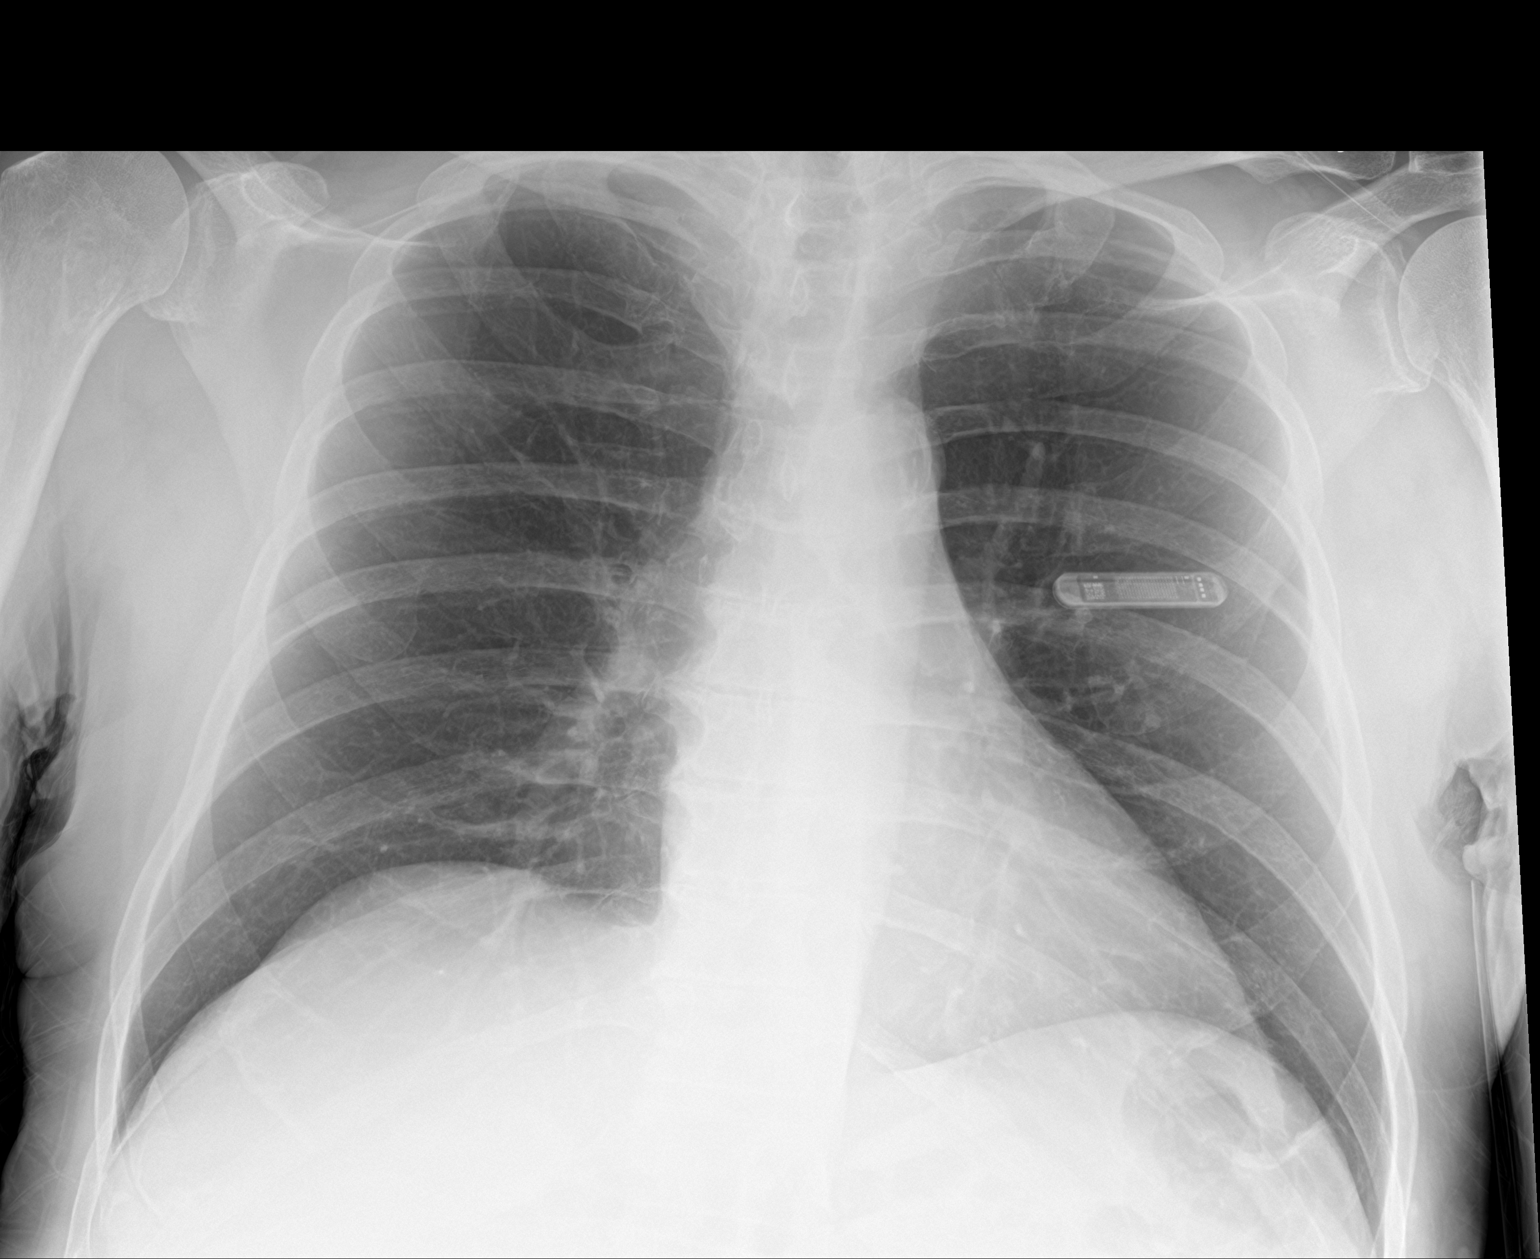

[chest lat]
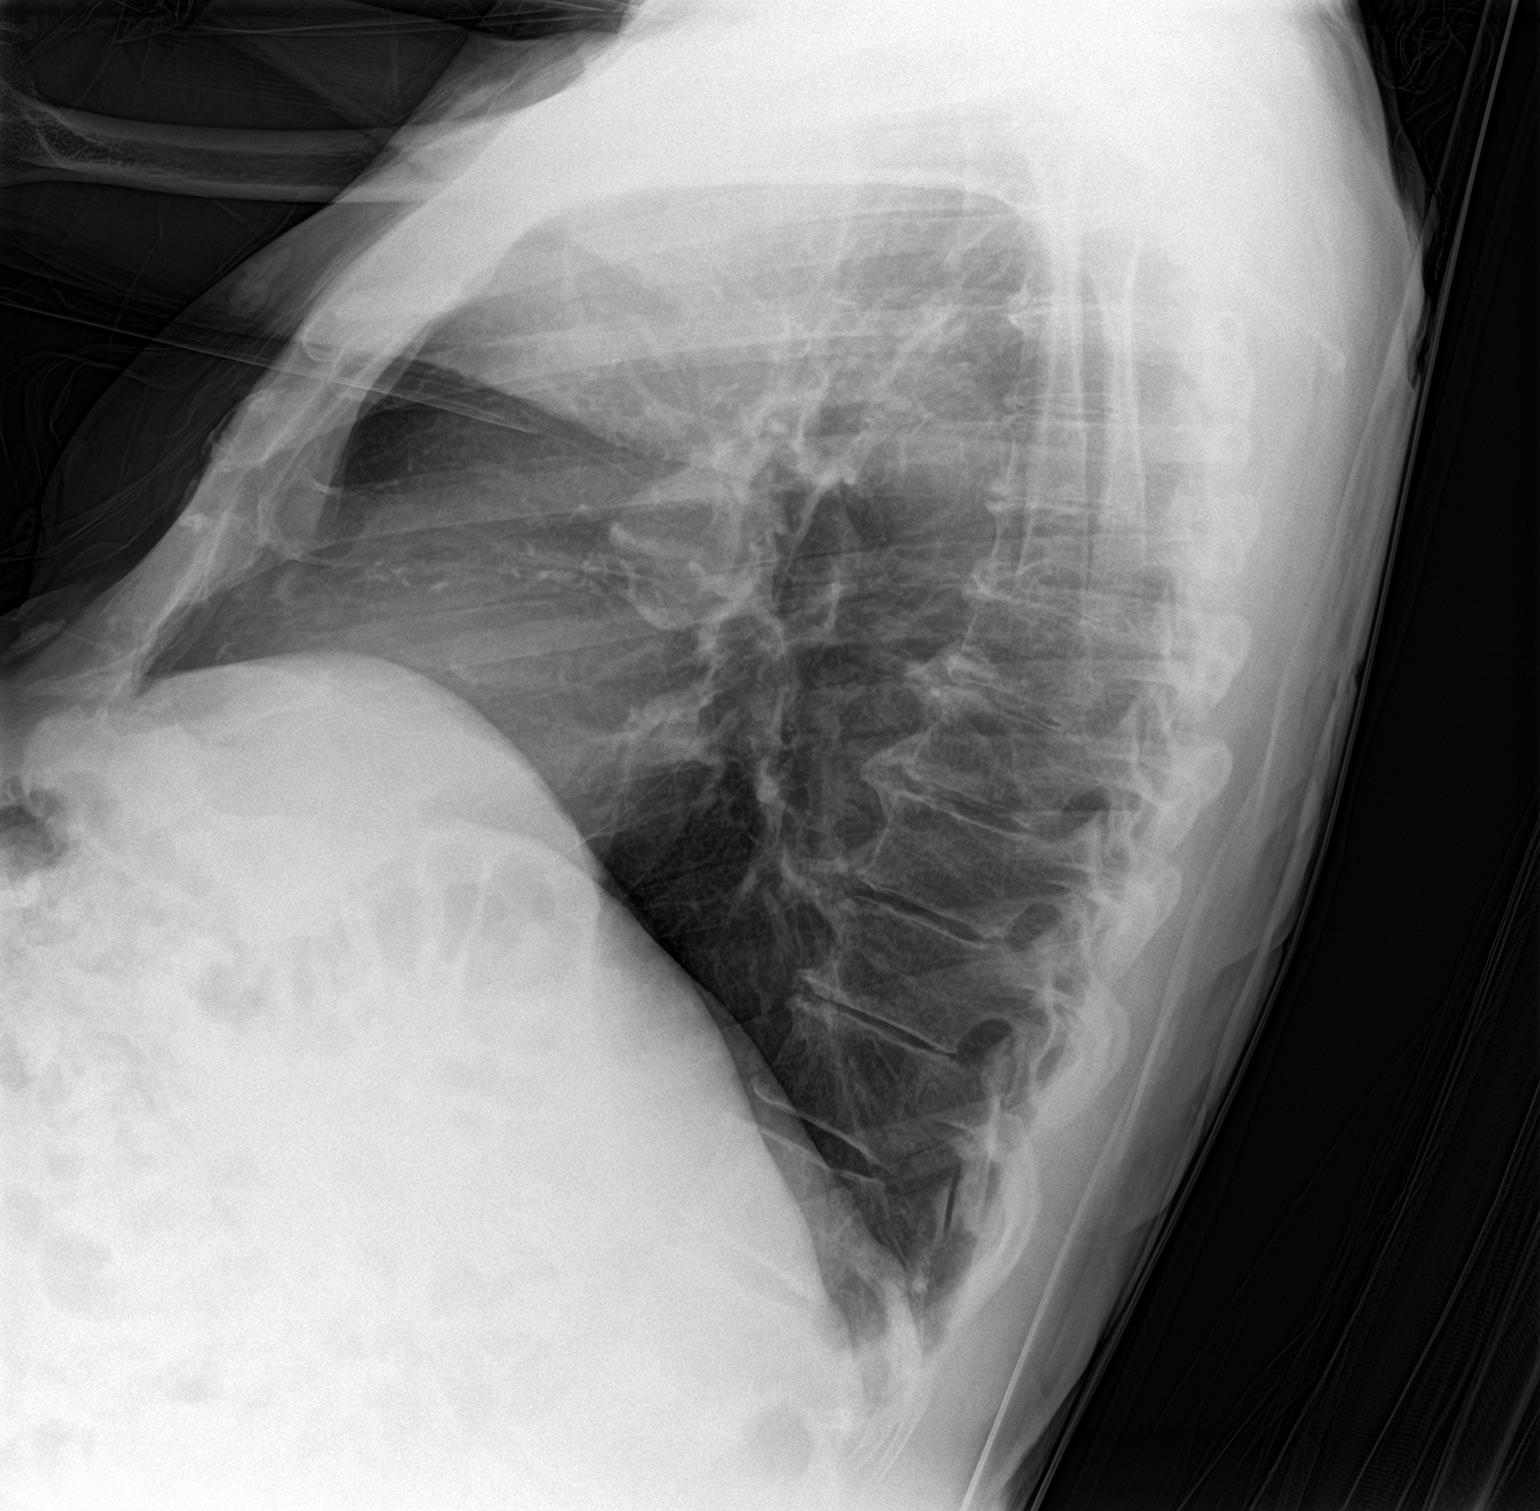

[2 of 2 positions shown; findings below may reference images not displayed]

FINDINGS: The heart size and mediastinal contours are within normal limits.
Both lungs are clear. The visualized skeletal structures are
unremarkable.
IMPRESSION: No active cardiopulmonary disease.

## 2021-10-27 MED ORDER — NIFEDIPINE 10 MG PO CAPS
10.0000 mg | ORAL_CAPSULE | ORAL | Status: DC | PRN
  Filled 2021-10-27: qty 1

## 2021-10-27 MED ORDER — MELATONIN 5 MG PO TABS
5.0000 mg | ORAL_TABLET | Freq: Every evening | ORAL | Status: DC | PRN
  Filled 2021-10-27: qty 1

## 2021-10-27 MED ORDER — ACETAMINOPHEN 325 MG PO TABS: 650.0000 mg | ORAL_TABLET | ORAL | Status: DC | PRN

## 2021-10-27 MED ORDER — ACETAMINOPHEN 650 MG RE SUPP: 650.0000 mg | RECTAL | Status: DC | PRN

## 2021-10-27 MED ORDER — ALBUTEROL SULFATE HFA 108 (90 BASE) MCG/ACT IN AERS
2.0000 | INHALATION_SPRAY | Freq: Four times a day (QID) | RESPIRATORY_TRACT | Status: DC | PRN
  Filled 2021-10-27: qty 6.7

## 2021-10-27 MED ORDER — ASPIRIN 81 MG PO CHEW
324.0000 mg | CHEWABLE_TABLET | Freq: Once | ORAL | Status: AC
  Administered 2021-10-27: 324 mg via ORAL
  Filled 2021-10-27: qty 4

## 2021-10-27 MED ORDER — ACETAMINOPHEN 160 MG/5ML PO SOLN
650.0000 mg | ORAL | Status: DC | PRN
  Filled 2021-10-27: qty 20.3

## 2021-10-27 MED ORDER — POLYVINYL ALCOHOL 1.4 % OP SOLN
1.0000 [drp] | Freq: Four times a day (QID) | OPHTHALMIC | Status: DC | PRN
  Filled 2021-10-27: qty 15

## 2021-10-27 MED ORDER — STROKE: EARLY STAGES OF RECOVERY BOOK: Freq: Once | Status: DC

## 2021-10-27 MED ORDER — ATORVASTATIN CALCIUM 20 MG PO TABS
40.0000 mg | ORAL_TABLET | Freq: Every day | ORAL | Status: DC
  Administered 2021-10-27: 40 mg via ORAL
  Filled 2021-10-27: qty 2

## 2021-10-27 MED ORDER — ENOXAPARIN SODIUM 40 MG/0.4ML IJ SOSY
40.0000 mg | PREFILLED_SYRINGE | INTRAMUSCULAR | Status: DC
  Administered 2021-10-27: 40 mg via SUBCUTANEOUS
  Filled 2021-10-27: qty 0.4

## 2021-10-27 MED ORDER — SENNOSIDES-DOCUSATE SODIUM 8.6-50 MG PO TABS: 1.0000 | ORAL_TABLET | Freq: Every evening | ORAL | Status: DC | PRN

## 2021-10-27 NOTE — ED Notes (Signed)
Pt provided a sandwich box and water - Ok'd by Dr. Sedalia Muta.  Urine placed at the bedside.

## 2021-10-27 NOTE — H&P (Signed)
History and Physical   Edward Campbell [id number] DOB: 1941/04/03 DOA: 10/27/2021  PCP: Valera Castle, MD  Outpatient Specialists: Dr. Janeece Riggers, [facility] Cardiology Patient coming from: Home via EMS  I have personally briefly reviewed patient's old medical records in [place].  Chief Concern: Imbalance  HPI: Edward Campbell is a right handed 80 y.o. male with medical history significant for hyperlipidemia, hypertension, history of chronic right frontal cortical infarct, then a history of traumatic subarachnoid hemorrhage, history of DVT, BPH, who presents emergency department for chief concerns of imbalance and difficulty ambulating.  He went to bed at 8 pm and he was his normal self. He was not able to get good sleep because of back pain. He frequently woke up and tossed and turned.   Upon waking up this AM, he tried to ambulate to the kitchen to get the coffee started, when he noticed he had difficulty walking. He kept bumping at the walls throughout this attempt. He then used the walker to walk around throughout the day.  His friends came to pick him up for their usual Sunday meeting. He states that he had difficulty getting into his friend's car. His friend noticed that he was close to falling multiple times, so they helped him back into the house to sit down in the living room in his rocking chair. Once he sat down, he was not able to get up. This was when he called EMS.  He endorsed dizziness in the morning and afternoon. The dizziness was intermittent and lasted about 15-20 minutes. He denies passing out or lost of consciousness.   He endorses diarrhea that started 10/26/21, watery, brown. He denies visual evidence of blood and/or black stool. He denies dysuria. He   He endorses a new cough that started on 10/26/21. He reports the cough was a dry cough. He took cough medications that helped him relieved the cough.   At bedside he is able to tell me his name, age,  location, and provided the full history by himself.  On physical exam his left hand strength < right hand, markedly.  He reports that he is right-handed.  Social history: He lives at home by himself. He denies tobacco use. He drinks wine every day, 4 oz 5-6 days per week. He denies recreational drug use. He is retire and formerly worked in [employer].   Vaccination history: He is vaccinated for covid-19, all the doses of Pfizer  ROS: Constitutional: no weight change, no fever ENT/Mouth: no sore throat, no rhinorrhea Eyes: no eye pain, no vision changes Cardiovascular: no chest pain, no dyspnea,  no edema, no palpitations Respiratory: no cough, no sputum, no wheezing Gastrointestinal: no nausea, no vomiting, no diarrhea, no constipation Genitourinary: no urinary incontinence, no dysuria, no hematuria Musculoskeletal: no arthralgias, no myalgias Skin: no skin lesions, no pruritus, Neuro: + weakness, no loss of consciousness, no syncope, + imbalance  Psych: no anxiety, no depression, no decrease appetite Heme/Lymph: no bruising, no bleeding  ED Course: Discussed with emergency medicine provider, patient requiring hospitalization for chief concerns of acute stroke.  Vitals in the ED was remarkable for temperature of 98.2, respiration rate of 18, heart rate 64, initial blood pressure 156/95, SPO2 of 99% on room air.  Labs in the emergency department showed sodium level of 134, potassium 3.8, chloride 109, bicarb 24, BUN of 18, serum creatinine of 1.12, nonfasting blood glucose 101, WBC 7.6, hemoglobin 13.5, platelets 256.  High sensitive troponin was 5.  COVID  PCR was positive.  UA was completed and was found to have negative for leukocytes and nitrites.  Assessment/Plan  Principal Problem:   Stroke [facility]) Active Problems:   HYPERCHOLESTEROLEMIA   Essential hypertension   # Acute ischemia of right centrum semiovale - A.m. team to consult neurology for further  recommendations as patient is status post aspirin 324 mg p.o. twice daily by EDP - Complete echo ordered - Fasting lipid and A1c ordered - Permissive hypertension with nifedipine - Nifedipine 10 mg every 4 hours as needed for SBP greater than 180, 36 hours ordered - Frequent neuro vascular checks - Per nursing staff, patient passes swallow screen - PT, OT - Heart healthy diet ordered - Patient will need outpatient follow-up with his neurologist - Fall precaution  - Admit to telemetry medical  # COVID-19 infection # Diarrhea-presumed secondary to gastroenteritis in setting of COVID-19 - Continue precautions - Symptomatic support as patient is not using accessory muscles, and is not in respiratory discomfort.  His lungs were clear to auscultation bilaterally and there was no wheezing and/or rhonchi's - Incentive spirometry, flutter valve - Albuterol as needed for shortness of breath and wheezing  # Primary hypertension-holding home amlodipine 5 mg daily # Hyperlipidemia-atorvastatin 40 mg nightly resumed # BPH-holding home finasteride and tamsulosin at this time  Chart reviewed.   DVT prophylaxis: Enoxaparin Code Status: Full code Diet: Heart healthy Family Communication: Updated son's [name] and [name] via phone over speaker with patient's permission Disposition Plan: Pending clinical course and neurology evaluation Consults called: None at this time Admission status: Telemetry medical  Past Medical History:  Diagnosis Date   Hyperlipidemia    Hypertension    Stroke [facility])    Past Surgical History:  Procedure Laterality Date   BACK SURGERY     Social History:  reports that he has never smoked. He has never used smokeless tobacco. He reports current alcohol use. He reports that he does not use drugs.  Allergies  Allergen Reactions   Albumin Human Other (See Comments)    Refuses all blood products as one of [employer]es   Family History  Problem Relation  Age of Onset   Hyperlipidemia Father    Family history: Family history reviewed and not pertinent  Prior to Admission medications   Medication Sig Start Date End Date Taking? Authorizing Provider  amLODipine (NORVASC) 5 MG tablet Take 5 mg by mouth daily. 03/21/20   [provider]  aspirin 81 MG EC tablet Take 1 tablet (81 mg total) by mouth daily at 6 (six) AM. HOLD aspirin for 7 days and resume it after discussing with PCP. 07/02/20   Hosie Poisson, MD  atorvastatin (LIPITOR) 40 MG tablet Take 40 mg by mouth at bedtime. 05/14/20   [provider]  Dextran 70-Hypromellose 0.1-0.3 % SOLN Apply 1 drop to eye every 6 (six) hours as needed for dry eyes.    [provider]  ELIQUIS 5 MG TABS tablet Take 1 tablet (5 mg total) by mouth every 12 (twelve) hours. Hold Eliquis for one week. Restart the medication after discussing with PCP. Patient not taking: Reported on 07/10/2020 07/02/20   Hosie Poisson, MD  finasteride (PROSCAR) 5 MG tablet Take 5 mg by mouth daily. 05/28/20   [provider]  levETIRAcetam (KEPPRA) 500 MG tablet Take 1 tablet (500 mg total) by mouth 2 (two) times daily for 7 days. 06/25/20 07/02/20  Hosie Poisson, MD  tamsulosin (FLOMAX) 0.4 MG CAPS capsule Take 0.4 mg  by mouth daily. 05/03/20   [provider]   Physical Exam: Vitals:   10/27/21 1040 10/27/21 1905 10/27/21 1957 10/27/21 2100  BP: 103/64 111/66 (!) 156/95 (!) 143/83  Pulse: 81 80 64 60  Resp: 18 16 18 13   Temp: 97.9 F (36.6 C) 98.2 F (36.8 C)    TempSrc: Oral Oral    SpO2: 95% 96% 98% 98%  Weight:      Height:       Constitutional: appears age appropriate, NAD, calm, comfortable Eyes: PERRL, lids and conjunctivae normal ENMT: Mucous membranes are moist. Posterior pharynx clear of any exudate or lesions. Age-appropriate dentition. Mild-moderate hearing loss  Neck: normal, supple, no masses, no thyromegaly Respiratory: clear to auscultation bilaterally, no wheezing, no  crackles. Normal respiratory effort. No accessory muscle use.  Cardiovascular: Regular rate and rhythm, no murmurs / rubs / gallops. No extremity edema. 2+ pedal pulses. No carotid bruits.  Abdomen: no tenderness, no masses palpated, no hepatosplenomegaly. Bowel sounds positive.  Musculoskeletal: no clubbing / cyanosis. No joint deformity upper and lower extremities. Good ROM, no contractures, no atrophy. Normal muscle tone.  Skin: no rashes, lesions, ulcers. No induration Neurologic: Sensation intact. Strength 5/5 in all 4.  Psychiatric: Normal judgment and insight. Alert and oriented x 3. Normal mood.   EKG: independently reviewed, showing sinus rhythm with rate of 85, QTc 440  Chest x-ray on Admission: I personally reviewed and I agree with radiologist reading as below.  DG Chest 2 View  Result Date: 10/27/2021 CLINICAL DATA:  Weakness, loss of balance EXAM: CHEST - 2 VIEW COMPARISON:  None. FINDINGS: The heart size and mediastinal contours are within normal limits. Both lungs are clear. The visualized skeletal structures are unremarkable. IMPRESSION: No active cardiopulmonary disease. Electronically Signed   By: 10/29/2021 M.D.   On: 10/27/2021 18:48   CT Head Wo Contrast  Result Date: 10/27/2021 CLINICAL DATA:  Loss of balance, neurologic deficit EXAM: CT HEAD WITHOUT CONTRAST TECHNIQUE: Contiguous axial images were obtained from the base of the skull through the vertex without intravenous contrast. COMPARISON:  08/23/2021 FINDINGS: Brain: Chronic right frontal cortical infarct is stable. No evidence of acute infarct or hemorrhage. Lateral ventricles and midline structures are unremarkable. No acute extra-axial fluid collections. No mass effect. Vascular: No hyperdense vessel or unexpected calcification. Skull: Normal. Negative for fracture or focal lesion. Sinuses/Orbits: Mucosal thickening throughout the bilateral maxillary and ethmoid sinuses. Remaining paranasal sinuses are clear.  Other: None. IMPRESSION: 1. No acute intracranial process. Stable chronic right frontal cortical infarct. Electronically Signed   By: 08/25/2021 M.D.   On: 10/27/2021 18:48   MR BRAIN WO CONTRAST  Result Date: 10/27/2021 CLINICAL DATA:  Neuro deficit, leg weakness EXAM: MRI HEAD WITHOUT CONTRAST TECHNIQUE: Multiplanar, multiecho pulse sequences of the brain and surrounding structures were obtained without intravenous contrast. COMPARISON:  Same-day noncontrast head CT FINDINGS: Brain: There is a small curvilinear focus of diffusion restriction in the right centrum semiovale consistent with acute ischemia. There is no evidence of large vessel territorial infarct. Remote infarcts in the right frontal lobe are unchanged. Additional small area of encephalomalacia is seen in the right temporal lobe. Moderate global parenchymal volume loss with enlargement of the ventricular system and extra-axial CSF spaces is again seen. Patchy FLAIR signal abnormality in the remainder of the subcortical and periventricular white matter likely reflects sequela of chronic white matter microangiopathy. There is no solid mass lesion.  There is no midline shift. Vascular: Normal flow voids.  Skull and upper cervical spine: Normal marrow signal. Sinuses/Orbits: There is mild mucosal thickening in the paranasal sinuses. Bilateral lens implants are in place. The globes and orbits are otherwise unremarkable. Other: None. IMPRESSION: 1. Small curvilinear focus of diffusion restriction in the right centrum semiovale consistent with acute ischemia. No other evidence of acute intracranial hemorrhage or infarct. 2. Unchanged remote infarcts, global parenchymal volume loss, and chronic white matter microangiopathy as above. Electronically Signed   By: Valetta Mole M.D.   On: 10/27/2021 19:45    Labs on Admission: I have personally reviewed following labs  CBC: Recent Labs  Lab 10/27/21 1143  WBC 7.6  HGB 13.5  HCT 40.2  MCV 96.2   PLT [id number]   Basic Metabolic Panel: Recent Labs  Lab 10/27/21 1143 10/27/21 2044  NA 134*  --   K 3.8  --   CL 103  --   CO2 24  --   GLUCOSE 101*  --   BUN 18  --   CREATININE 1.12  --   CALCIUM 8.7*  --   MG  --  2.5*   GFR: Estimated Creatinine Clearance: 50 mL/min (by C-G formula based on SCr of 1.12 mg/dL).  Urine analysis:    Component Value Date/Time   COLORURINE AMBER (A) 10/27/2021 1817   APPEARANCEUR HAZY (A) 10/27/2021 1817   LABSPEC 1.025 10/27/2021 1817   PHURINE 5.0 10/27/2021 1817   GLUCOSEU NEGATIVE 10/27/2021 1817   HGBUR NEGATIVE 10/27/2021 1817   BILIRUBINUR NEGATIVE 10/27/2021 1817   KETONESUR 5 (A) 10/27/2021 1817   PROTEINUR 30 (A) 10/27/2021 1817   NITRITE NEGATIVE 10/27/2021 1817   LEUKOCYTESUR NEGATIVE 10/27/2021 1817   Dr. Tobie Poet [facility]  If 7PM-7AM, please contact overnight-coverage provider If 7AM-7PM, please contact day coverage provider [email or web address]  10/27/2021, 9:44 PM

## 2021-10-27 NOTE — ED Triage Notes (Signed)
Pt reports not sure why he is here, states EMS brought him because he lost his balance walking. Pt states this is not a new thing but it was worse this am so they made him come.

## 2021-10-27 NOTE — ED Notes (Signed)
Pt eating dinner meal  ?

## 2021-10-27 NOTE — ED Provider Notes (Signed)
[facility] Emergency Department Provider Note   ____________________________________________   Event Date/Time   First MD Initiated Contact with Patient 10/27/21 1720     (approximate)  I have reviewed the triage vital signs and the nursing notes.   HISTORY  Chief Complaint Weakness    HPI Edward Campbell is a 80 y.o. male with past medical history of hypertension, hyperlipidemia, prostatitis, and stroke who presents to the ED complaining of weakness.  Patient reports that he was feeling fine when he went to bed around 8 PM last night, woke up this morning with feeling of instability when he tries to walk.  He reports only being able to take a few steps and having to support himself on the wall or other objects to prevent himself from falling.  He denies any associated vision changes, speech changes, focal numbness or weakness.  He has never had similar symptoms in the past.  He does report feeling dizzy and lightheaded at times, denies sensation of the room spinning around him.  He has felt well recently with no fevers, cough, chest pain, or shortness of breath.        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 Authorizing Provider  amLODipine (NORVASC) 5 MG tablet Take 5 mg by mouth daily. 03/21/20   [provider]  aspirin 81 MG EC tablet Take 1 tablet (81 mg total) by mouth  daily at 6 (six) AM. HOLD aspirin for 7 days and resume it after discussing with PCP. 07/02/20   Kathlen Mody, MD  atorvastatin (LIPITOR) 40 MG tablet Take 40 mg by mouth at bedtime. 05/14/20   [provider]  Dextran 70-Hypromellose 0.1-0.3 % SOLN Apply 1 drop to eye every 6 (six) hours as needed for dry eyes.    [provider]  ELIQUIS 5 MG TABS tablet Take 1 tablet (5 mg total) by mouth every 12 (twelve) hours. Hold Eliquis for one week. Restart the medication after discussing with PCP. Patient not taking: Reported on 07/10/2020 07/02/20   Kathlen Mody, MD  finasteride (PROSCAR) 5 MG tablet Take 5 mg by mouth daily. 05/28/20   [provider]  levETIRAcetam (KEPPRA) 500 MG tablet Take 1 tablet (500 mg total) by mouth 2 (two) times daily for 7 days. 06/25/20 07/02/20  Kathlen Mody, MD  tamsulosin (FLOMAX) 0.4 MG CAPS capsule Take 0.4 mg by mouth daily. 05/03/20   [provider]    Allergies Albumin human  No family history on file.  Social History Social History   Tobacco Use   Smoking status: Never   Smokeless tobacco: Never  Substance Use Topics   Alcohol use: Yes    Comment: wine     Review of Systems  Constitutional: No fever/chills Eyes: No visual changes. ENT: No sore throat. Cardiovascular: Denies chest pain.  Positive for dizziness and lightheadedness. Respiratory: Denies shortness of  breath. Gastrointestinal: No abdominal pain.  No nausea, no vomiting.  No diarrhea.  No constipation. Genitourinary: Negative for dysuria. Musculoskeletal: Negative for back pain. Skin: Negative for rash. Neurological: Negative for headaches, focal weakness or numbness.  Positive for unsteady gait.  ____________________________________________   PHYSICAL EXAM:  VITAL SIGNS: ED Triage Vitals  Enc Vitals Group     BP 10/27/21 1040 103/64     Pulse Rate 10/27/21 1040 81     Resp 10/27/21 1040 18     Temp 10/27/21 1040 97.9 F (36.6 C)     Temp  Source 10/27/21 1040 Oral     SpO2 10/27/21 1040 95 %     Weight 10/27/21 1039 160 lb (72.6 kg)     Height 10/27/21 1039 5\' 8"  (1.727 m)     Head Circumference --      Peak Flow --      Pain Score 10/27/21 1053 0     Pain Loc --      Pain Edu? --      Excl. in GC? --     Constitutional: Alert and oriented. Eyes: Conjunctivae are normal. Head: Atraumatic. Nose: No congestion/rhinnorhea. Mouth/Throat: Mucous membranes are moist. Neck: Normal ROM Cardiovascular: Normal rate, regular rhythm. Grossly normal heart sounds.  2+ radial pulses bilaterally. Respiratory: Normal respiratory effort.  No retractions. Lungs CTAB. Gastrointestinal: Soft and nontender. No distention. Genitourinary: deferred Musculoskeletal: No lower extremity tenderness nor edema. Neurologic:  Normal speech and language.  5 out of 5 strength in bilateral upper and lower extremities with no pronator drift.  Dysmetria noted with finger-nose testing of left upper extremity, finger-nose testing intact to right upper extremity and heel-to-shin testing intact to bilateral lower extremities. Skin:  Skin is warm, dry and intact. No rash noted. Psychiatric: Mood and affect are normal. Speech and behavior are normal.  ____________________________________________   LABS (all labs ordered are listed, but only abnormal results are displayed)  Labs Reviewed  RESP PANEL BY RT-PCR (FLU A&B, COVID) ARPGX2 - Abnormal; Notable for the following components:      Result Value   SARS Coronavirus 2 by RT PCR POSITIVE (*)    All other components within normal limits  BASIC METABOLIC PANEL - Abnormal; Notable for the following components:   Sodium 134 (*)    Glucose, Bld 101 (*)    Calcium 8.7 (*)    All other components within normal limits  CBC - Abnormal; Notable for the following components:   RBC 4.18 (*)    All other components within normal limits  URINALYSIS, ROUTINE W REFLEX MICROSCOPIC - Abnormal; Notable for the  following components:   Color, Urine AMBER (*)    APPearance HAZY (*)    Ketones, ur 5 (*)    Protein, ur 30 (*)    Bacteria, UA RARE (*)    All other components within normal limits  TROPONIN I (HIGH SENSITIVITY)   ____________________________________________  EKG  ED ECG REPORT I, 10/29/21, the attending physician, personally viewed and interpreted this ECG.   Date: 10/27/2021  EKG Time: 10:46  Rate: 85  Rhythm: normal sinus rhythm  Axis: LAD  Intervals:left anterior fascicular block  ST&T Change: None   PROCEDURES  Procedure(s) performed (including Critical Care):  Procedures   ____________________________________________   INITIAL IMPRESSION / ASSESSMENT AND PLAN / ED COURSE      80 year old male with past medical history of hypertension, hyperlipidemia, prostatitis, and stroke with no residual deficits who presents to the ED complaining  of unsteady gait since waking up this morning.  Patient with no focal weakness on exam but does seem to have dysmetria of his left upper extremity.  He was last known well at 8 PM last night and is outside the window for tPA, no findings to suggest large vessel occlusion at this time.  Initial EKG and labs are unremarkable, we will check CT head and add on troponin, UA is also pending.  CT head is negative for acute process, troponin within normal limits and UA shows no signs of infection.  Patient remains very unsteady on his feet and he is unable to walk even with the assistance of a walker.  MRI brain was obtained and is concerning for small area of acute ischemia, likely contributing to patient's symptoms.  He has also tested positive for COVID-19 however has no difficulty breathing at this time.  Chest x-ray reviewed by me and shows no infiltrate, edema, or effusion.  Patient given loading dose of aspirin and case discussed with hospitalist for admission.      ____________________________________________   FINAL  CLINICAL IMPRESSION(S) / ED DIAGNOSES  Final diagnoses:  Cerebrovascular accident (CVA), unspecified mechanism (HCC)  Gait instability  COVID-19     ED Discharge Orders     None        Note:  This document was prepared using Dragon voice recognition software and may include unintentional dictation errors.    Chesley Noon, MD 10/27/21 2014

## 2021-10-27 NOTE — ED Triage Notes (Signed)
Pt in via EMS from home with c/o leg weakness. Pt able to ambulate with cane. Pt with hx op back issues that got worse last pm

## 2021-10-27 NOTE — ED Notes (Signed)
Pt to MRI

## 2021-10-27 NOTE — ED Notes (Signed)
EDP, [place] at bedside.

## 2021-10-28 ENCOUNTER — Observation Stay (HOSPITAL_BASED_OUTPATIENT_CLINIC_OR_DEPARTMENT_OTHER)
Admit: 2021-10-28 | Discharge: 2021-10-28 | Disposition: A | Discharge status: Home / Self Care | Payer: Medicare Other | Attending: Internal Medicine | Admitting: Internal Medicine

## 2021-10-28 ENCOUNTER — Encounter: Payer: Self-pay | Admitting: Internal Medicine

## 2021-10-28 ENCOUNTER — Observation Stay: Payer: Medicare Other

## 2021-10-28 DIAGNOSIS — E7800 Pure hypercholesterolemia, unspecified: Secondary | ICD-10-CM

## 2021-10-28 DIAGNOSIS — I10 Essential (primary) hypertension: Secondary | ICD-10-CM | POA: Diagnosis not present

## 2021-10-28 DIAGNOSIS — I63411 Cerebral infarction due to embolism of right middle cerebral artery: Secondary | ICD-10-CM | POA: Diagnosis not present

## 2021-10-28 DIAGNOSIS — U071 COVID-19: Secondary | ICD-10-CM | POA: Diagnosis not present

## 2021-10-28 DIAGNOSIS — I6389 Other cerebral infarction: Secondary | ICD-10-CM | POA: Diagnosis not present

## 2021-10-28 LAB — ECHOCARDIOGRAM COMPLETE
AR max vel: 2.19 cm2
AV Area VTI: 2.34 cm2
AV Area mean vel: 2.27 cm2
AV Mean grad: 3 mmHg
AV Peak grad: 4.8 mmHg
Ao pk vel: 1.1 m/s
Area-P 1/2: 2.11 cm2
Height: 68 in
MV VTI: 2.22 cm2
S' Lateral: 2.3 cm
Weight: 2560 oz

## 2021-10-28 LAB — LIPID PANEL
Cholesterol: 121 mg/dL (ref 0–200)
HDL: 34 mg/dL — ABNORMAL LOW (ref >40)
LDL Cholesterol: 68 mg/dL (ref 0–99)
Total CHOL/HDL Ratio: 3.6 RATIO
Triglycerides: 94 mg/dL (ref <150)
VLDL: 19 mg/dL (ref 0–40)

## 2021-10-28 LAB — HEMOGLOBIN A1C
Hgb A1c MFr Bld: 5.3 % (ref 4.8–5.6)
Mean Plasma Glucose: 105.41 mg/dL

## 2021-10-28 LAB — VITAMIN B12: Vitamin B-12: 1348 pg/mL — ABNORMAL HIGH (ref 180–914)

## 2021-10-28 IMAGING — CT CT ANGIO HEAD-NECK (W OR W/O PERF)
3 of 7 series · 9 of 34 positions shown · IV contrast (omnipaque)
Comparison: MRI and CT head [DATE].
COMPARISON: MRI and CT head [DATE].

Addendum:
CLINICAL DATA: Neuro deficit, acute, stroke suspected

EXAM:
CT ANGIOGRAPHY HEAD AND NECK
TECHNIQUE: Multidetector CT imaging of the head and neck was performed using
the standard protocol during bolus administration of intravenous
contrast. Multiplanar CT image reconstructions and MIPs were
obtained to evaluate the vascular anatomy. Carotid stenosis
measurements (when applicable) are obtained utilizing NASCET
criteria, using the distal internal carotid diameter as the
denominator.
CONTRAST:  75mL OMNIPAQUE IOHEXOL 350 MG/ML SOLN

[Series 5: cta head neck · axial · 0.66mm/px · z∈[+123,+245]mm · 2 of 184 slices shown]
[im 62/184  soft-tissue]
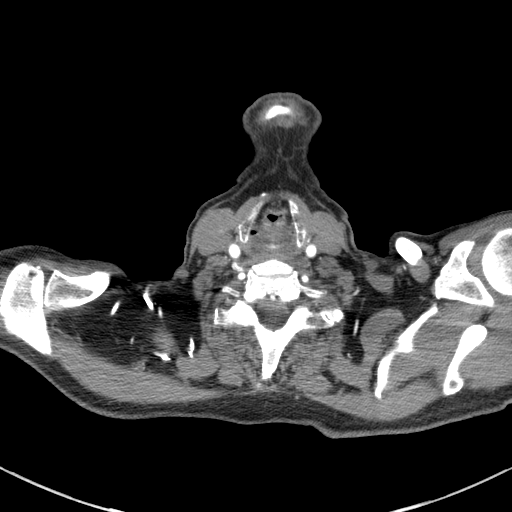
[im 123/184  soft-tissue]
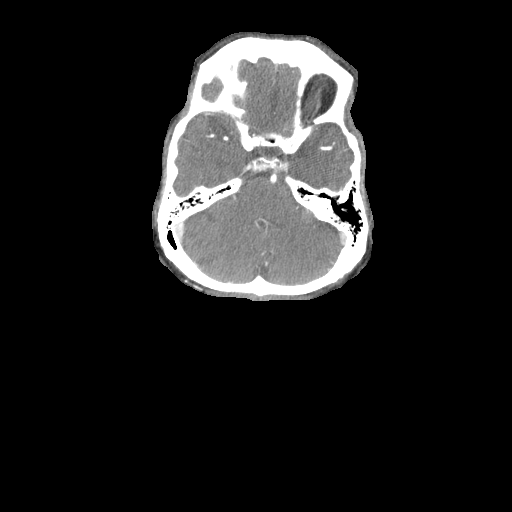

[Series 7: ax thin · axial · 0.58mm/px · z∈[+35,+296]mm · 6 of 364 slices shown]
[im 52/364  soft-tissue]
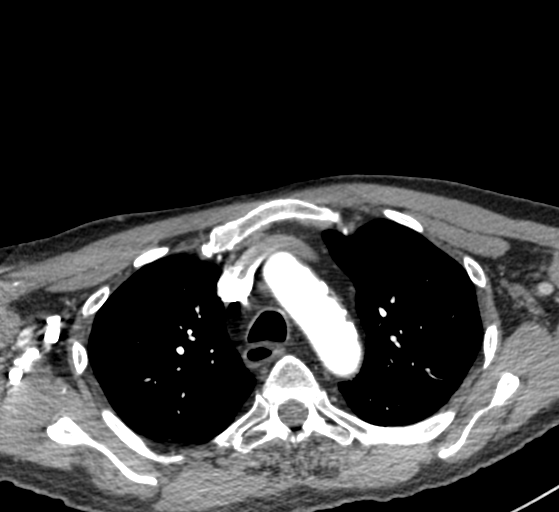
[im 104/364  bone]
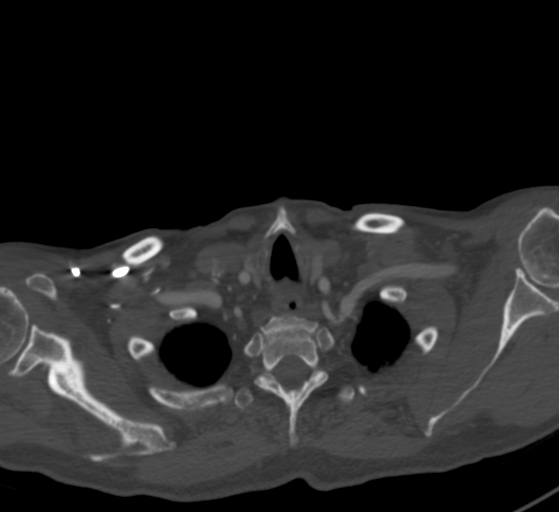
[im 156/364  soft-tissue]
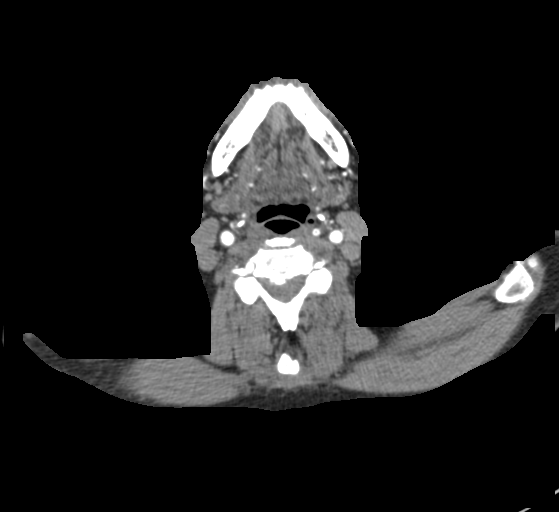
[im 208/364  bone]
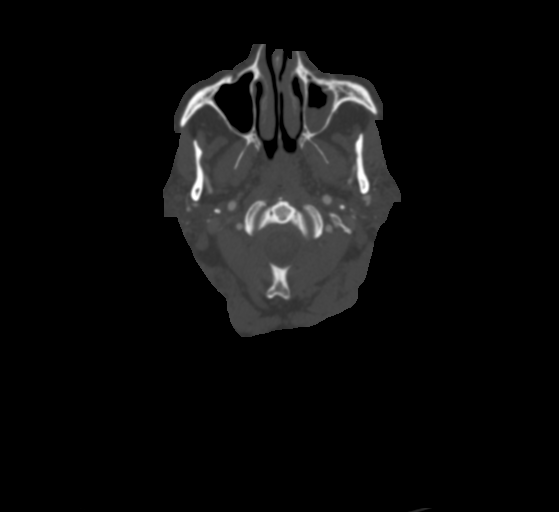
[im 260/364  soft-tissue]
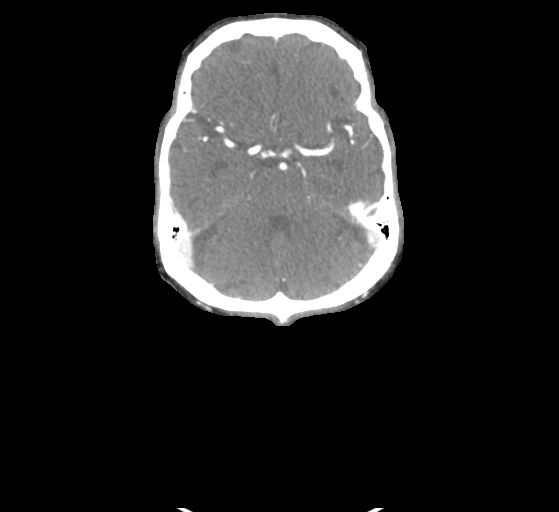
[im 312/364  bone]
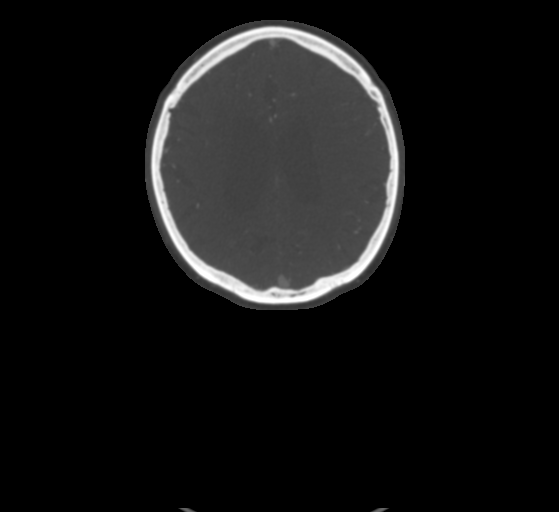

[Series 13: sagittal thin-- · sagittal · 0.59mm/px · 1 of 469 slices shown]
[im 145/469  soft-tissue]
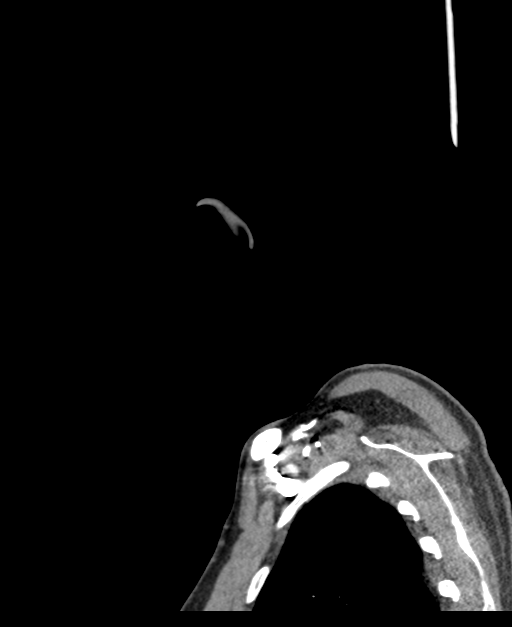

[9 of 34 positions shown; findings below may reference images not displayed]

FINDINGS: CTA NECK FINDINGS

Aortic arch: Atherosclerosis of the aorta and great vessel origins,
which are patent.

Right carotid system: Mild-to-moderate atherosclerotic narrowing of
the common carotid artery origin. Atherosclerosis at the carotid
bifurcation with approximately 30% stenosis of the internal carotid
artery origin.

Left carotid system: Carotid bifurcation atherosclerosis without
greater than 50% stenosis.

Vertebral arteries: Slightly right dominant. Moderate bilateral
vertebral artery origin stenosis.

Skeleton: Severe degenerative disc disease at C3-C4 and C5-C6.

Other neck: No acute abnormality

Upper chest: Visualized lung apices are clear

Review of the MIP images confirms the above findings

CTA HEAD FINDINGS

Anterior circulation: Mild atherosclerotic narrowing of bilateral
intracranial ICAs. Bilateral MCAs and ACAs are patent without
proximal hemodynamically significant stenosis. Mildly dysplastic
left MCA bifurcation without discrete aneurysm.

Posterior circulation: Moderate right and mild left intradural
vertebral artery stenosis. Basilar artery and bilateral posterior
cerebral arteries are patent without proximal hemodynamically
significant stenosis.

Venous sinuses: As permitted by contrast timing, patent.

Review of the MIP images confirms the above findings
IMPRESSION: CTA head:

1. No large vessel occlusion.
2. Moderate right and mild left intradural vertebral artery
stenosis.
3. Mild bilateral intracranial ICA stenosis.

CTA neck:

1. Moderate bilateral vertebral artery origin stenosis.
2. Approximately 30% stenosis of the right internal carotid artery
origin.
3. Mild-to-moderate stenosis of the right common carotid artery
origin.

ADDENDUM:
On further review, the right internal carotid stenosis measures up
to 50%.

Findings discussed with Dr. ORIDES at [DATE] p.m. via telephone.

*** End of Addendum ***
FINDINGS: CTA NECK FINDINGS

Aortic arch: Atherosclerosis of the aorta and great vessel origins,
which are patent.

Right carotid system: Mild-to-moderate atherosclerotic narrowing of
the common carotid artery origin. Atherosclerosis at the carotid
bifurcation with approximately 30% stenosis of the internal carotid
artery origin.

Left carotid system: Carotid bifurcation atherosclerosis without
greater than 50% stenosis.

Vertebral arteries: Slightly right dominant. Moderate bilateral
vertebral artery origin stenosis.

Skeleton: Severe degenerative disc disease at C3-C4 and C5-C6.

Other neck: No acute abnormality

Upper chest: Visualized lung apices are clear

Review of the MIP images confirms the above findings

CTA HEAD FINDINGS

Anterior circulation: Mild atherosclerotic narrowing of bilateral
intracranial ICAs. Bilateral MCAs and ACAs are patent without
proximal hemodynamically significant stenosis. Mildly dysplastic
left MCA bifurcation without discrete aneurysm.

Posterior circulation: Moderate right and mild left intradural
vertebral artery stenosis. Basilar artery and bilateral posterior
cerebral arteries are patent without proximal hemodynamically
significant stenosis.

Venous sinuses: As permitted by contrast timing, patent.

Review of the MIP images confirms the above findings
IMPRESSION: CTA head:

1. No large vessel occlusion.
2. Moderate right and mild left intradural vertebral artery
stenosis.
3. Mild bilateral intracranial ICA stenosis.

CTA neck:

1. Moderate bilateral vertebral artery origin stenosis.
2. Approximately 30% stenosis of the right internal carotid artery
origin.
3. Mild-to-moderate stenosis of the right common carotid artery
origin.

## 2021-10-28 MED ORDER — CLOPIDOGREL BISULFATE 75 MG PO TABS: 75.0000 mg | ORAL_TABLET | Freq: Every day | ORAL | 0 refills | Status: AC

## 2021-10-28 MED ORDER — CLOPIDOGREL BISULFATE 75 MG PO TABS: 75.0000 mg | ORAL_TABLET | Freq: Every day | ORAL | Status: DC

## 2021-10-28 MED ORDER — ASPIRIN 81 MG PO TBEC
81.0000 mg | DELAYED_RELEASE_TABLET | Freq: Every day | ORAL | 12 refills | Status: AC
Start: 2021-10-28 — End: ?

## 2021-10-28 MED ORDER — CLOPIDOGREL BISULFATE 75 MG PO TABS
300.0000 mg | ORAL_TABLET | Freq: Once | ORAL | Status: AC
  Administered 2021-10-28: 15:00:00 300 mg via ORAL
  Filled 2021-10-28: qty 4

## 2021-10-28 MED ORDER — IOHEXOL 350 MG/ML SOLN
75.0000 mL | Freq: Once | INTRAVENOUS | Status: AC | PRN
  Administered 2021-10-28: 11:00:00 75 mL via INTRAVENOUS

## 2021-10-28 MED ORDER — ASPIRIN 81 MG PO TBEC: 81.0000 mg | DELAYED_RELEASE_TABLET | Freq: Every day | ORAL | 12 refills | Status: DC

## 2021-10-28 MED ORDER — ASPIRIN EC 81 MG PO TBEC
81.0000 mg | DELAYED_RELEASE_TABLET | Freq: Every day | ORAL | Status: DC
  Administered 2021-10-28: 81 mg via ORAL
  Filled 2021-10-28: qty 1

## 2021-10-28 NOTE — Progress Notes (Signed)
Received MD order to discharge patient to home with Home Health.  Reviewed discharge instructions, homes med, follow up appointments  and prescriptions with patient and patient verbalized understanding.

## 2021-10-28 NOTE — TOC Transition Note (Signed)
Transition of Care [facility]) - CM/SW Discharge Note   Patient Details  Name: TASEAN MANCHA MRN: [id number] Date of Birth: 1941/07/06  Transition of Care [facility]) CM/SW Contact:  Margarito Liner, LCSW Phone Number: 10/28/2021, 3:46 PM   Clinical Narrative:  Patient has orders to discharge home today. [facility] representative is aware. No further concerns. CSW signing off.  Final next level of care: Home w Home Health Services Barriers to Discharge: Barriers Resolved   Patient Goals and CMS Choice        Discharge Placement                    Patient and family notified of of transfer: 10/28/21  Discharge Plan and Services   Discharge Planning Services: CM Consult            DME Arranged: 3-N-1, Walker rolling DME Agency: [employer] Date DME Agency Contacted: 10/28/21 Time DME Agency Contacted: [phone] Representative spoke with at DME Agency: Oletha Cruel HH Arranged: PT, OT HH Agency: [facility] Date HH Agency Contacted: 10/28/21 Time HH Agency Contacted: 1200 Representative spoke with at [facility] Agency: Pearson Grippe  Social Determinants of Health (SDOH) Interventions     Readmission Risk Interventions No flowsheet data found.

## 2021-10-28 NOTE — TOC Initial Note (Signed)
Transition of Care [facility]) - Initial/Assessment Note    Patient Details  Name: GIANG HEMME MRN: [id number] Date of Birth: 01-04-1941  Transition of Care [facility]) CM/SW Contact:    Caryn Section, RN Phone Number: 10/28/2021, 2:48 PM  Clinical Narrative:     Patient will return home with DME 3n1 and rolling walker.  [facility] contacted: Pearson Grippe for care at home.                Expected Discharge Plan: Home w Home Health Services     Patient Goals and CMS Choice        Expected Discharge Plan and Services Expected Discharge Plan: Home w Home Health Services   Discharge Planning Services: CM Consult                     DME Arranged: 3-N-1, Walker rolling DME Agency: [facility] Date DME Agency Contacted: 10/28/21 Time DME Agency Contacted: [phone] Representative spoke with at DME Agency: Oletha Cruel HH Arranged: PT, OT HH Agency: [facility] Date HH Agency Contacted: 10/28/21 Time HH Agency Contacted: 1200 Representative spoke with at [facility] Agency: Pearson Grippe  Prior Living Arrangements/Services     Patient language and need for interpreter reviewed:: Yes (No need for interpreter) Do you feel safe going back to the place where you live?: Yes      Need for Family Participation in Patient Care: Yes (Comment) Care giver support system in place?: Yes (comment)   Criminal Activity/Legal Involvement Pertinent to Current Situation/Hospitalization: No - Comment as needed  Activities of Daily Living Home Assistive Devices/Equipment: Walker (specify type) ADL Screening (condition at time of admission) Patient's cognitive ability adequate to safely complete daily activities?: Yes Is the patient deaf or have difficulty hearing?: Yes Does the patient have difficulty seeing, even when wearing glasses/contacts?: No Does the patient have difficulty concentrating, remembering, or making decisions?: Yes Patient able to express need for assistance with ADLs?:  Yes Does the patient have difficulty dressing or bathing?: No Independently performs ADLs?: Yes (appropriate for developmental age) Does the patient have difficulty walking or climbing stairs?: Yes Weakness of Legs: Both Weakness of Arms/Hands: None  Permission Sought/Granted Permission sought to share information with : Case Manager Permission granted to share information with : Yes, Verbal Permission Granted     Permission granted to share info w AGENCY: DME and HH companies        Emotional Assessment       Orientation: : Oriented to  Time, Oriented to Place, Oriented to Self, Oriented to Situation Alcohol / Substance Use: Not Applicable Psych Involvement: No (comment)  Admission diagnosis:  Gait instability [R26.81] Stroke [facility]) [I63.9] Cerebrovascular accident (CVA), unspecified mechanism (HCC) [I63.9] COVID-19 [U07.1] Patient Active Problem List   Diagnosis Date Noted   Stroke (HCC) 10/27/2021   Essential hypertension 10/27/2021   COVID-19 virus detected 10/27/2021   Gastroenteritis due to COVID-19 virus 10/27/2021   Malnutrition of moderate degree 06/25/2020   Fall    Palliative care by specialist    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 [place] [address] Phone: [phone] Fax: [phone]  [id number] [place], [place] -  [address] [place] [address] Phone: [phone] Fax: [phone]     Social Determinants of Health (SDOH) Interventions    Readmission Risk Interventions No flowsheet data found.

## 2021-10-28 NOTE — Evaluation (Addendum)
Physical Therapy Evaluation Patient Details Name: Edward Campbell MRN: [id number] DOB: 1941/04/12 Today's Date: 10/28/2021  History of Present Illness  Pt is an 80 y.o. male presenting to hospital 11/20 c/o weakness and unsteady gait; also noted with dysmetria of L UE.  MRI of brain concerning for small area of acute ischemia.  Pt admitted with acute ischemia of R centrum semiovale; (+) COVID-19, diarrhea.  PMH includes htn, HLD, prostatitis, SAH, RLS, Raynauds syndrome, back surgery, and stroke.  Clinical Impression  Prior to hospital admission, pt was ambulatory with 4ww; lives alone in 1 level home (level entry).  Currently pt is CGA with ambulation up to 120 feet with RW.  During ambulation, pt noted with narrow BOS initially which improved with cueing; pt also noted with impaired L LE advancement/foot clearance (pt reporting feeling like he was dragging his L foot at times) which caused unsteadiness although pt able to self correct during session--L foot advancement improved with focus on mechanics but pt noted with impaired L foot clearance with increased distance ambulating/fatigue.  Pt reports wanting to discharge home with HHPT.  Educated pt on pacing, further PT recommendations, and assist recommendations.  Therapist called pt's oldest son [name] per pt request to update him on his functional status, therapy needs (recommending HHPT), and assist levels (recommend initial SBA with all functional mobility for safety and assist around the home first 2-3 days for safety).  TOC also updated on pt's functional status and above noted recommendations.  Pt would benefit from skilled PT to address noted impairments and functional limitations (see below for any additional details).    Recommendations for follow up therapy are one component of a multi-disciplinary discharge planning process, led by the attending physician.  Recommendations may be updated based on patient status, additional functional criteria  and insurance authorization.  Follow Up Recommendations Home health PT    Assistance Recommended at Discharge Frequent or constant Supervision/Assistance  Functional Status Assessment Patient has had a recent decline in their functional status and demonstrates the ability to make significant improvements in function in a reasonable and predictable amount of time.  Equipment Recommendations  Rolling walker (2 wheels)    Recommendations for Other Services OT consult     Precautions / Restrictions Precautions Precautions: Fall Restrictions Weight Bearing Restrictions: No      Mobility  Bed Mobility               General bed mobility comments: Deferred (pt sitting in recliner beginning/end of session)    Transfers Overall transfer level: Needs assistance Equipment used: Rolling walker (2 wheels) Transfers: Sit to/from Stand Sit to Stand: Min guard           General transfer comment: x3 trials standing from recliner; fairly strong stand noted    Ambulation/Gait Ambulation/Gait assistance: Min guard Gait Distance (Feet):  (60 feet with RW; 30 feet no AD use; and 120 feet with RW) Assistive device: Rolling walker (2 wheels);None   Gait velocity: decreased     General Gait Details: trialed ambulation 60 feet with RW CGA and pt noted with narrow BOS and decreased L foot clearance; trialed ambulation 30 feet (no AD) per pt request and pt noted with difficulty clearing L foot again (especially with turns in room); and then trialed ambulation 120 feet with RW CGA (improved L foot clearance and BOS noted with focus on L LE although L foot clearance decreased again with increased distance ambulating/fatigue)  Stairs  Wheelchair Mobility    Modified Rankin (Stroke Patients Only)       Balance Overall balance assessment: Needs assistance Sitting-balance support: No upper extremity supported;Feet supported Sitting balance-Leahy Scale: Good Sitting  balance - Comments: steady sitting reaching within BOS   Standing balance support: Bilateral upper extremity supported;During functional activity Standing balance-Leahy Scale: Fair Standing balance comment: CGA for safety d/t impaired L LE foot advancement causing unsteadiness at times                             Pertinent Vitals/Pain Pain Assessment: No/denies pain Vitals stable and WFL throughout treatment session.    Home Living Family/patient expects to be discharged to:: Private residence Living Arrangements: Alone Available Help at Discharge: Other (Comment) (Family member checks in on pt and assists with errands) Type of Home: House Home Access: Level entry       Home Layout: One level Home Equipment: Rollator (4 wheels);Cane - single point;Grab bars - tub/shower;Shower seat - built in      Prior Function Prior Level of Function : Needs assist             Mobility Comments: Pt ambulatory with 4ww; will walk short distances in home without assistive device; no falls in past 6 months ADLs Comments: Family assists pt with errands     Hand Dominance        Extremity/Trunk Assessment   Upper Extremity Assessment Upper Extremity Assessment: Defer to OT evaluation    Lower Extremity Assessment Lower Extremity Assessment: LLE deficits/detail (R LE WFL) LLE Deficits / Details: 4+/5 hip flexion, knee flexion/extension, and DF/PF (intact light touch sensation and proprioception) LLE Coordination: decreased gross motor (decreased quality heel to shin coordination)    Cervical / Trunk Assessment Cervical / Trunk Assessment: Normal  Communication   Communication: HOH  Cognition Arousal/Alertness: Awake/alert Behavior During Therapy: WFL for tasks assessed/performed Overall Cognitive Status: Within Functional Limits for tasks assessed                                          General Comments  Nursing cleared pt for participation in  physical therapy.  Pt agreeable to PT session.     Exercises  Gait training   Assessment/Plan    PT Assessment Patient needs continued PT services  PT Problem List Decreased strength;Decreased balance;Decreased mobility;Decreased coordination;Decreased knowledge of use of DME;Decreased knowledge of precautions       PT Treatment Interventions DME instruction;Gait training;Functional mobility training;Therapeutic activities;Therapeutic exercise;Balance training;Patient/family education    PT Goals (Current goals can be found in the Care Plan section)  Acute Rehab PT Goals Patient Stated Goal: to go home with HHPT PT Goal Formulation: With patient Time For Goal Achievement: 11/11/21 Potential to Achieve Goals: Good    Frequency 7X/week   Barriers to discharge Other (comment) Possibly level of assist available    Co-evaluation               AM-PAC PT "6 Clicks" Mobility  Outcome Measure Help needed turning from your back to your side while in a flat bed without using bedrails?: None Help needed moving from lying on your back to sitting on the side of a flat bed without using bedrails?: None Help needed moving to and from a bed to a chair (including a wheelchair)?: A Little Help needed standing  up from a chair using your arms (e.g., wheelchair or bedside chair)?: A Little Help needed to walk in hospital room?: A Little Help needed climbing 3-5 steps with a railing? : A Little 6 Click Score: 20    End of Session Equipment Utilized During Treatment: Gait belt Activity Tolerance: Patient tolerated treatment well Patient left: in chair;with call bell/phone within reach;with chair alarm set Nurse Communication: Mobility status;Precautions PT Visit Diagnosis: Other abnormalities of gait and mobility (R26.89);Hemiplegia and hemiparesis Hemiplegia - Right/Left: Left Hemiplegia - caused by: Cerebral infarction    Time: 1135-1210 PT Time Calculation (min) (ACUTE ONLY): 35  min   Charges:   PT Evaluation $PT Eval Low Complexity: 1 Low PT Treatments $Gait Training: 23-37 mins       Hendricks Limes, PT 10/28/21, 1:34 PM  Addendum:  PT frequency updated to 7x/week. Hendricks Limes, PT 10/28/21, 1:39 PM

## 2021-10-28 NOTE — Consult Note (Signed)
Neurology Consultation Reason for Consult: Stroke on MRI Requesting Physician: Gevena Barre  CC: Worsened left leg weakness  History is obtained from: Patient, chart review  HPI: Edward Campbell is a 80 y.o. 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  He reports that he is having some increased difficulty with ambulation but he did not think much else but that his friends were concerned about, prompting presentation to the ED where he was found to have a small acute stroke and ataxia of the left upper extremity that had not been noted previously.  He was incidentally found to be COVID-19 positive and on further questioning was noted to have had some diarrhea and mild cough.  He is fully vaccinated including booster.  He denies any other recent signs or symptoms including any other infectious symptoms or any signs or symptoms of bleeding, noting his diarrhea was brown and not black or red.   LKW: 8 PM on 11/19 tPA given?: No, out of the window Premorbid modified rankin scale: 1-2  ROS: All other review of systems was negative except as noted in the HPI.   Past Medical History:  Diagnosis Date   Hyperlipidemia    Hypertension    Stroke [facility])    Past Surgical History:  Procedure Laterality Date   BACK SURGERY     Current Outpatient Medications  Medication Instructions   amLODipine (NORVASC) 5 mg, Oral, Daily   aspirin 81 mg, Oral, Daily, HOLD aspirin for 7 days and resume it after discussing with PCP.   atorvastatin (LIPITOR) 40 mg, Oral, Daily at bedtime   Dextran 70-Hypromellose 0.1-0.3 % SOLN 1 drop, Ophthalmic, Every 6 hours PRN   Eliquis 5 mg, Oral, Every 12 hours, Hold Eliquis for one week. Restart the medication after discussing with PCP.   finasteride (PROSCAR) 5  mg, Oral, Daily   levETIRAcetam (KEPPRA) 500 mg, Oral, 2 times daily   tamsulosin (FLOMAX) 0.4 mg, Oral, Daily  - Not taking eliquis currently given no clear indication at this time   Family History  Problem Relation Age of Onset   Hyperlipidemia Father    Social History:  reports that he has never smoked. He has never used smokeless tobacco. He reports current alcohol use. He reports that he does not use drugs.   Exam: Current vital signs: BP 112/66   Pulse (!) 58   Temp 98.4 F (36.9 C)   Resp 18   Ht 5\' 8"  (1.727 m)   Wt 72.6 kg   SpO2 95%   BMI 24.33 kg/m  Vital signs in last 24 hours: Temp:  [97.9 F (36.6 C)-98.7 F (37.1 C)] 98.4 F (36.9 C) (11/21 0749) Pulse Rate:  [58-81] 58 (11/21 0749) Resp:  [13-23] 18 (11/21 0749) BP: (103-156)/(63-123) 112/66 (11/21 0749) SpO2:  [93 %-100 %] 95 % (11/21 0749) Weight:  [72.6 kg] 72.6 kg (11/20 1039)   Physical Exam  Constitutional: Appears well-developed and well-nourished.  Psych: Affect appropriate to situation, calm and cooperative Eyes: No scleral injection HENT: No oropharyngeal obstruction.  MSK: no joint deformities.  Cardiovascular: Normal rate and regular rhythm.  Respiratory: Effort normal, non-labored breathing GI: Soft.  No distension. There is no tenderness.  Skin: Warm dry and intact visible skin  Neuro: Mental Status: Patient is awake, alert, oriented to person, place, month, year, and situation.  Patient is able to give a clear and coherent history. No signs of aphasia or neglect, though he is slightly hard of hearing which is compounded by use of N95 mask.  He does seem to have some mild word finding difficulty occasionally, for example took some time to think of the fact that the round part of the watch is called a face Cranial Nerves: II: Visual Fields are full. Pupils are equal, round, and reactive to light.   III,IV, VI: EOMI without ptosis or diploplia, but he does have saccadic pursuits and  some mild end gaze nystagmus.  V: Facial sensation is symmetric to temperature VII: Facial movement is symmetric.  VIII: hearing is intact to voice X: Uvula elevates symmetrically XI: Shoulder shrug is symmetric. XII: tongue is midline without atrophy or fasciculations.  Motor: Tone is normal. Bulk is normal. 5/5 strength was present in all four extremities, other than pain limitation at the right upper extremity deltoid (4+/5).  Sensory: Sensation is symmetric to light touch in the arms and legs. Deep Tendon Reflexes: 2+ and symmetric in the biceps  Cerebellar: FNF and HKS are slightly impaired on the left upper extremity and left lower extremity compared to the right  NIHSS total 2 Score breakdown: Ataxia of the left upper and lower extremity (likely only the left upper extremity is new)    I have reviewed labs in epic and the results pertinent to this consultation are: LDL 68, B12 1348, UA negative for infection, creatinine 1.  1 2 with a GFR greater than 60, A1c 5.3% Echocardiogram with some LVH and grade 1 diastolic dysfunction, normal biatrial sizes  I have reviewed the images obtained:  MRI brain personally reviewed, agree with radiology: 1. Small curvilinear focus of diffusion restriction in the right centrum semiovale consistent with acute ischemia. No other evidence of acute intracranial hemorrhage or infarct. 2. Unchanged remote infarcts, global parenchymal volume loss, and chronic white matter microangiopathy as above.  Head CT personally reviewed, agree with radiology: 1. No acute intracranial process. Stable chronic right frontal cortical infarct.  ECHO read pending  1. Left ventricular ejection fraction, by estimation, is 60 to 65%. The  left ventricle has normal function. The left ventricle has no regional  wall motion abnormalities. There is mild left ventricular hypertrophy.  Left ventricular diastolic parameters  are consistent with Grade I diastolic  dysfunction (impaired relaxation).   2. Right ventricular systolic function is normal. The right ventricular  size is normal. Tricuspid regurgitation signal is inadequate for assessing  PA pressure.   3. The mitral valve is normal in structure. Mild mitral valve  regurgitation. No evidence of mitral stenosis.   4. The aortic valve is normal in structure. Aortic valve regurgitation is  trivial. Aortic valve sclerosis/calcification is present, without any  evidence of aortic stenosis.   5. The inferior vena cava is normal in size with greater than 50%  respiratory variability, suggesting right atrial pressure of 3 mmHg.    CTA head and neck personally reviewed  CTA head:  1. No large vessel occlusion. 2. 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 3. Mild bilateral intracranial ICA stenosis. CTA neck: 1. Moderate bilateral vertebral artery origin stenosis. 2. Approximately 30% stenosis of the right internal carotid artery origin. 3. Mild-to-moderate stenosis of the right common carotid artery origin. 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 Findings discussed with Dr. Tempie Hoist at 12:40 p.m. via telephone.  Impression: This is an  80 year old gentleman presenting with worsened left-sided symptoms from baseline, found to be COVID-19 positive.  His stroke work-up is again overall bland, however all of his strokes have been in the distribution of the right carotid artery.  At points of this does seem to have some stenosis, up to 50% on review with neuroradiology. I do think he would benefit from vascular surgery evaluation for potential intervention though outside of formal standard criteria; at minimum will benefit for a course of dual antiplatelet therapy.  He should also be started on anticoagulation on 11/23 if he remains clinically stable and loop recorder interrogation demonstrates atrial fibrillation (with DAPT stopped at  that point)  Recommendations: # Small embolic appearing right parietal stroke, likely atheroembolic versus potentially cardioembolic - [id number] pending, fasting lipid panel with LDL meeting goal, continue home atorvastatin 40 mg nightly - CTA head and neck for vessel imaging completed on my request as above - Echocardiogram read pending - Loop recorder interrogation requested by myself, appreciate cardiology assistance - Prophylactic therapy-Antiplatelet med: Aspirin - dose 325mg  PO or 300mg  PR, followed by 81 mg daily  - Plavix 300 mg load with 75 mg daily for at least a 21 day course, may be extended to 90 day course pending discussion with outpatient neurologist or per vascular surgery - Blood pressure goal   - Normotension as he has been tolerating this well and is 24 hours outside of symptom onset  - PT consult, OT consult, Speech consult, unless patient is back to baseline - Vascular surgery consult, appropriate for close outpatient follow-up if cannot be seen inpatient today due to the late hour - Neurology will be available on an as-needed basis going forward, please reach out if any questions or concerns arise   Lesleigh Noe MD-PhD [facility] [phone] [facility] coverage for [facility] is from 8 AM to 4 AM in-house and 4 PM to 8 PM by telephone/video. 8 PM to 8 AM emergent questions or overnight urgent questions should be addressed to Teleneurology On-call or [facility] neurohospitalist; contact information can be found on AMION

## 2021-10-28 NOTE — Progress Notes (Signed)
Put faxed information received on patients pacemaker into his hard file.

## 2021-10-28 NOTE — Discharge Summary (Addendum)
Discharge Summary  Edward Campbell UYQ:[id number] DOB: Apr 13, 1941  PCP: Dione Housekeeper, MD  Admit date: 10/27/2021 Discharge date: 10/28/2021  Time spent: 45 minutes  Recommendations for Outpatient Follow-up:  New medication: Plavix 75 mg p.o. daily x3 weeks New medication: Aspirin 81 mg p.o. daily (patient had been previously prescribed this medication, but had not been taking it.  He has been encouraged to do so.) Medication change: Voltaren discontinued Patient will be followed up with home health PT and OT. Patient will follow up with vascular surgery in their office for consideration of intervention for right carotid  Discharge Diagnoses:  Active Hospital Problems   Diagnosis Date Noted   Stroke [facility]) 10/27/2021   Essential hypertension 10/27/2021   COVID-19 virus detected 10/27/2021   Gastroenteritis due to COVID-19 virus 10/27/2021   HYPERCHOLESTEROLEMIA 12/04/2008    Resolved Hospital Problems  No resolved problems to display.    Discharge Condition: Improved, being discharged home  Diet recommendation: Heart healthy  Vitals:   10/28/21 1040 10/28/21 1142  BP: (!) 110/59 129/77  Pulse: 66 64  Resp: 16 18  Temp: 98.2 F (36.8 C) 98.2 F (36.8 C)  SpO2: 98% 100%    History of present illness:  80 year old male with past medical history notably for hyperlipidemia, hypertension, previous CVA of the right frontal cortex, DVT and BPH presented to the emergency room on 11/20 with complaints of difficulty ambulating and some lightheadedness.  In the emergency room, suspected to have an acute stroke.  CT scan of head unremarkable but MRI noted small curvilinear focus of diffuse restriction in right centrum semiovale consistent with acute CVA.  Neurology consulted.  He was also found to incidentally have COVID.  Hospital Course:  Principal Problem: Acute stroke [facility]): Seen by neurology.  Lipid panel notes low HDL 34, but LDL at target at 68.  A1c normal at 5.3.   Patient does not smoke.  Seen by PT and OT who recommended home health.  No speech deficits.  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  Patient previously had been on aspirin for previous CVA, but had not been taking this medication.  He has been encouraged to do so in the meantime will also be on Plavix 75 mg p.o. daily for 3 weeks.  Echocardiogram unrevealing.  Patient will follow-up with vascular surgery as outpatient.  Although stenoses at best 50%, neurology recommending vascular surgery to evaluate for intervention outside of normal protocol.  Pacemaker interrogated noting no evidence of atrial fibrillation. Active Problems:   HYPERCHOLESTEROLEMIA: At target LDL.  Continue statin.  History of pacemaker: Patient had pacemaker interrogated noting no evidence of atrial fibrillation.    Essential hypertension: Allowing for some permissive hypertension, continue home medications.    COVID-19 virus detected: Incidental finding.  Patient asymptomatic.  Breathing comfortably on room air.  Does not need Remdisivir.  Advised to stay in isolation for the next 9 days.  Chronic diastolic heart failure: Grade 1 diastolic dysfunction incidentally noted on echocardiogram.  Patient euvolemic.  Procedures: Echocardiogram: Grade 1 diastolic dysfunction, preserved ejection fraction  Consultations: Neurology  Discharge Exam: BP 129/77   Pulse 64   Temp 98.2 F (36.8 C) (Oral)   Resp 18   Ht  Edward Campbell Wt 72.6 kg  SpO2 100%   BMI 24.33 kg/m   General: Alert and oriented x3, no acute distress Cardiovascular: Regular rate and rhythm, S1-S2 Respiratory: Clear to auscultation bilaterally  Discharge Instructions You were cared for by a  hospitalist during your hospital stay. If you have any questions about your discharge medications or the care you received while you were in the hospital after you are discharged, you can call the unit and asked to speak with the hospitalist on call if the hospitalist that took care of you is not available. Once you are discharged, your primary care physician will handle any further medical issues. Please note that NO REFILLS for any discharge medications will be authorized once you are discharged, as it is imperative that you return to your primary care physician (or establish a relationship with a primary care physician if you do not have one) for your aftercare needs so that they can reassess your need for medications and monitor your lab values.  Discharge Instructions     Diet - low sodium heart healthy   Complete by: As directed    Increase activity slowly   Complete by: As directed       Allergies as of 10/28/2021       Reactions   Albumin Human Other (See Comments)   Refuses all blood products as one of [employer]es        Medication List     STOP taking these medications    Dextran 70-Hypromellose 0.1-0.3 % Soln   diclofenac 75 MG EC tablet Commonly known as: VOLTAREN   levETIRAcetam 500 MG tablet Commonly known as: KEPPRA       TAKE these medications    amLODipine 5 MG tablet Commonly known as: NORVASC Take 5 mg by mouth daily.   aspirin 81 MG EC tablet Take 1 tablet (81 mg total) by mouth daily at 6 (six) AM. What changed: additional instructions   atorvastatin 40 MG tablet Commonly known as: LIPITOR Take 40 mg by mouth at bedtime.   clopidogrel 75 MG tablet Commonly known as: PLAVIX Take 1 tablet (75 mg total) by mouth daily for 20 days. Start taking on: October 29, 2021   finasteride 5 MG tablet Commonly known as: PROSCAR Take 5 mg by mouth daily.   folic acid 1 MG tablet Commonly known as: FOLVITE Take 2 mg by mouth daily.                Durable Medical Equipment  (From admission, onward)           Start     Ordered   10/28/21 1441  For home use only DME Walker rolling  Once       Question Answer Comment  Walker: With 5 Inch Wheels   Patient needs a walker to treat with the following condition Impaired ambulation      10/28/21 1440   10/28/21 1441  For home use only DME 3 n 1  Once        10/28/21 1441             Allergies  Allergen Reactions   Albumin Human Other (See Comments)    Refuses all blood products as one of Jehov[employer]es      The results of significant diagnostics from this hospitalization (including imaging, microbiology, ancillary and laboratory) are listed below for reference.    Significant Diagnostic Studies: CT ANGIO HEAD NECK W WO CM  Addendum Date: 10/28/2021   ADDENDUM REPORT: 10/28/2021  12:57 ADDENDUM: On further review, the right internal carotid stenosis measures up to 50%. Findings discussed with Dr. Tempie Hoist at 12:40 p.m. via telephone. Electronically Signed   By: Feliberto Harts M.D.   On: 10/28/2021 12:57   Result Date: 10/28/2021 CLINICAL DATA:  Neuro deficit, acute, stroke suspected EXAM: CT ANGIOGRAPHY HEAD AND NECK TECHNIQUE: Multidetector CT imaging of the head and neck was performed using the standard protocol during bolus administration of intravenous contrast. Multiplanar CT image reconstructions and MIPs were obtained to evaluate the vascular anatomy. Carotid stenosis measurements (when applicable) are obtained utilizing NASCET criteria, using the distal internal carotid diameter as the denominator. CONTRAST:  72mL OMNIPAQUE IOHEXOL 350 MG/ML SOLN COMPARISON:  MRI and CT head October 27, 2021. FINDINGS: CTA NECK FINDINGS Aortic arch: Atherosclerosis of the aorta and great vessel origins, which are patent. Right carotid system: Mild-to-moderate atherosclerotic narrowing of the common carotid artery origin. Atherosclerosis at the carotid bifurcation with  approximately 30% stenosis of the internal carotid artery origin. Left carotid system: Carotid bifurcation atherosclerosis without greater than 50% stenosis. Vertebral arteries: Slightly right dominant. Moderate bilateral vertebral artery origin stenosis. 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 Other neck: No acute abnormality Upper chest: Visualized lung apices are clear Review of the MIP images confirms the above findings CTA HEAD FINDINGS Anterior circulation: Mild atherosclerotic narrowing of bilateral intracranial ICAs. Bilateral MCAs and ACAs are patent without proximal hemodynamically significant stenosis. Mildly dysplastic left MCA bifurcation without discrete aneurysm. Posterior circulation: Moderate right and mild left intradural vertebral artery stenosis. Basilar artery and bilateral posterior cerebral arteries are patent without proximal hemodynamically significant stenosis. Venous sinuses: As permitted by contrast timing, patent. Review of the MIP images confirms the above findings IMPRESSION: CTA head: 1. No large vessel occlusion. 2. 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 3. Mild bilateral intracranial ICA stenosis. CTA neck: 1. Moderate bilateral vertebral artery origin stenosis. 2. Approximately 30% stenosis of the right internal carotid artery origin. 3. Mild-to-moderate stenosis of the right common carotid artery origin. Electronically Signed: By: Feliberto Harts M.D. On: 10/28/2021 11:52   DG Chest 2 View  Result Date: 10/27/2021 CLINICAL DATA:  Weakness, loss of balance EXAM: CHEST - 2 VIEW COMPARISON:  None. FINDINGS: The heart size and mediastinal contours are within normal limits. Both lungs are clear. The visualized skeletal structures are unremarkable. IMPRESSION: No active cardiopulmonary disease. Electronically Signed   By: Sharlet Salina M.D.   On: 10/27/2021 18:48   CT Head Wo Contrast  Result Date: 10/27/2021 CLINICAL DATA:   Loss of balance, neurologic deficit EXAM: CT HEAD WITHOUT CONTRAST TECHNIQUE: Contiguous axial images were obtained from the base of the skull through the vertex without intravenous contrast. COMPARISON:  08/23/2021 FINDINGS: Brain: Chronic right frontal cortical infarct is stable. No evidence of acute infarct or hemorrhage. Lateral ventricles and midline structures are unremarkable. No acute extra-axial fluid collections. No mass effect. Vascular: No hyperdense vessel or unexpected calcification. Skull: Normal. Negative for fracture or focal lesion. Sinuses/Orbits: Mucosal thickening throughout the bilateral maxillary and ethmoid sinuses. Remaining paranasal sinuses are clear. Other: None. IMPRESSION: 1. No acute intracranial process. Stable chronic right frontal cortical infarct. Electronically Signed   By: Sharlet Salina M.D.   On: 10/27/2021 18:48   MR BRAIN WO CONTRAST  Result Date: 10/27/2021 CLINICAL DATA:  Neuro deficit, leg weakness EXAM: MRI HEAD WITHOUT CONTRAST TECHNIQUE: Multiplanar, multiecho pulse sequences of the brain and surrounding structures were obtained without intravenous contrast. COMPARISON:  Same-day noncontrast head CT FINDINGS: Brain: There is a small curvilinear focus of diffusion restriction in the right centrum semiovale consistent with acute ischemia. There is no evidence of large vessel territorial infarct. Remote infarcts in the right frontal lobe are unchanged. Additional small area of encephalomalacia is seen in the right temporal lobe. Moderate global parenchymal volume loss with enlargement of the ventricular system and extra-axial CSF spaces is again seen. Patchy FLAIR signal abnormality in the remainder of the subcortical and periventricular white matter likely reflects sequela of chronic white matter microangiopathy. There is no solid mass lesion.  There is no midline shift. Vascular: Normal flow voids. Skull and upper cervical spine: Normal marrow signal.  Sinuses/Orbits: There is mild mucosal thickening in the paranasal sinuses. Bilateral lens implants are in place. The globes and orbits are otherwise unremarkable. Other: None. IMPRESSION: 1. Small curvilinear focus of diffusion restriction in the right centrum semiovale consistent with acute ischemia. No other evidence of acute intracranial hemorrhage or infarct. 2. Unchanged remote infarcts, global parenchymal volume loss, and chronic white matter microangiopathy as above. Electronically Signed   By: Lesia Hausen M.D.   On: 10/27/2021 19:45   ECHOCARDIOGRAM COMPLETE  Result Date: 10/28/2021    ECHOCARDIOGRAM REPORT   Patient Name:   Edward Campbell Vu Date of Exam: 10/28/2021 Medical Rec #:  [id number]      Height:       68.0 in Accession #:    [id number]     Weight:       160.0 lb Date of Birth:  Aug 08, 1941      BSA:          1.859 m Patient Age:    80 years       BP:           112/66 mmHg Patient Gender: M              HR:           58 bpm. Exam Location:  [facility] Procedure: 2D Echo, Cardiac Doppler and Color Doppler Indications:     Stroke I63.9  History:         Patient has no prior history of Echocardiogram examinations.                  Stroke; Risk Factors:Hypertension and Dyslipidemia.  Sonographer:     Cristela Blue Referring Phys:  [id number] AMY N COX Diagnosing Phys: Lorine Bears MD IMPRESSIONS  1. Left ventricular ejection fraction, by estimation, is 60 to 65%. The left ventricle has normal function. The left ventricle has no regional wall motion abnormalities. There is mild left ventricular hypertrophy. Left ventricular diastolic parameters are consistent with Grade I diastolic dysfunction (impaired relaxation).  2. Right ventricular systolic function is normal. The right ventricular size is normal. Tricuspid regurgitation signal is inadequate for assessing PA pressure.  3. The mitral valve is normal in structure. Mild mitral valve regurgitation. No evidence of mitral stenosis.  4. The aortic valve is normal  in structure. Aortic valve regurgitation is trivial. Aortic valve sclerosis/calcification is present, without any evidence of aortic stenosis.  5. The inferior vena cava is normal in size with greater than 50% respiratory variability, suggesting right atrial pressure of 3 mmHg. FINDINGS  Left Ventricle: Left ventricular ejection fraction, by estimation, is 60 to 65%. The left ventricle has normal function. The left ventricle has no regional wall motion abnormalities. The left ventricular internal cavity size was normal in size. There is  mild left  ventricular hypertrophy. Left ventricular diastolic parameters are consistent with Grade I diastolic dysfunction (impaired relaxation). Right Ventricle: The right ventricular size is normal. No increase in right ventricular wall thickness. Right ventricular systolic function is normal. Tricuspid regurgitation signal is inadequate for assessing PA pressure. Left Atrium: Left atrial size was normal in size. Right Atrium: Right atrial size was normal in size. Pericardium: There is no evidence of pericardial effusion. Mitral Valve: The mitral valve is normal in structure. There is mild thickening of the mitral valve leaflet(s). There is moderate calcification of the mitral valve leaflet(s). Mild mitral annular calcification. Mild mitral valve regurgitation. No evidence of mitral valve stenosis. MV peak gradient, 3.2 mmHg. The mean mitral valve gradient is 1.0 mmHg. Tricuspid Valve: The tricuspid valve is normal in structure. Tricuspid valve regurgitation is not demonstrated. No evidence of tricuspid stenosis. Aortic Valve: The aortic valve is normal in structure. Aortic valve regurgitation is trivial. Aortic valve sclerosis/calcification is present, without any evidence of aortic stenosis. Aortic valve mean gradient measures 3.0 mmHg. Aortic valve peak gradient measures 4.8 mmHg. Aortic valve area, by VTI measures 2.34 cm. Pulmonic Valve: The pulmonic valve was normal in  structure. Pulmonic valve regurgitation is trivial. No evidence of pulmonic stenosis. Aorta: The aortic root is normal in size and structure. Venous: The inferior vena cava is normal in size with greater than 50% respiratory variability, suggesting right atrial pressure of 3 mmHg. IAS/Shunts: No atrial level shunt detected by color flow Doppler.  LEFT VENTRICLE PLAX 2D LVIDd:         4.00 cm   Diastology LVIDs:         2.30 cm   LV e' medial:    5.44 cm/s LV PW:         1.00 cm   LV E/e' medial:  10.6 LV IVS:        1.50 cm   LV e' lateral:   5.87 cm/s LVOT diam:     2.20 cm   LV E/e' lateral: 9.8 LV SV:         53 LV SV Index:   28 LVOT Area:     3.80 cm  RIGHT VENTRICLE RV S prime:     14.10 cm/s TAPSE (M-mode): 3.6 cm LEFT ATRIUM             Index        RIGHT ATRIUM           Index LA diam:        3.90 cm 2.10 cm/m   RA Area:     13.30 cm LA Vol (A2C):   57.6 ml 30.99 ml/m  RA Volume:   25.90 ml  13.93 ml/m LA Vol (A4C):   33.6 ml 18.08 ml/m LA Biplane Vol: 48.0 ml 25.82 ml/m  AORTIC VALVE                    PULMONIC VALVE AV Area (Vmax):    2.19 cm     PV Vmax:        0.52 m/s AV Area (Vmean):   2.27 cm     PV Vmean:       32.600 cm/s AV Area (VTI):     2.34 cm     PV VTI:         0.093 m AV Vmax:           110.00 cm/s  PV Peak grad:   1.1 mmHg AV Vmean:  72.100 cm/s  PV Mean grad:   1.0 mmHg AV VTI:            0.226 m      RVOT Peak grad: 2 mmHg AV Peak Grad:      4.8 mmHg AV Mean Grad:      3.0 mmHg LVOT Vmax:         63.30 cm/s LVOT Vmean:        43.100 cm/s LVOT VTI:          0.139 m LVOT/AV VTI ratio: 0.62  AORTA Ao Root diam: 3.30 cm MITRAL VALVE MV Area (PHT): 2.11 cm     SHUNTS MV Area VTI:   2.22 cm     Systemic VTI:  0.14 m MV Peak grad:  3.2 mmHg     Systemic Diam: 2.20 cm MV Mean grad:  1.0 mmHg     Pulmonic VTI:  0.168 m MV Vmax:       0.89 m/s MV Vmean:      45.7 cm/s MV Decel Time: 359 msec MV E velocity: 57.40 cm/s MV A velocity: 103.00 cm/s MV E/A ratio:  0.56 Lorine Bears  MD Electronically signed by Lorine Bears MD Signature Date/Time: 10/28/2021/9:07:49 AM    Final     Microbiology: Recent Results (from the past 240 hour(s))  Resp Panel by RT-PCR (Flu A&B, Covid) Nasopharyngeal Swab     Status: Abnormal   Collection Time: 10/27/21  5:33 PM   Specimen: Nasopharyngeal Swab; Nasopharyngeal(NP) swabs in vial transport medium  Result Value Ref Range Status   SARS Coronavirus 2 by RT PCR POSITIVE (A) NEGATIVE Final    Comment: RESULT CALLED TO, READ BACK BY AND VERIFIED WITH: JULIA SHEEHAN  10/27/21 LFD (NOTE) SARS-CoV-2 target nucleic acids are DETECTED.  The SARS-CoV-2 RNA is generally detectable in upper respiratory specimens during the acute phase of infection. Positive results are indicative of the presence of the identified virus, but do not rule out bacterial infection or co-infection with other pathogens not detected by the test. Clinical correlation with patient history and other diagnostic information is necessary to determine patient infection status. The expected result is Negative.  Fact Sheet for Patients: BloggerCourse.com  Fact Sheet for Healthcare Providers: SeriousBroker.it  This test is not yet approved or cleared by the [place] FDA and  has been authorized for detection and/or diagnosis of SARS-CoV-2 by FDA under an Emergency Use Authorization (EUA).  This EUA will remain in effect (meaning this test can be  used) for the duration of  the COVID-19 declaration under Section 564(b)(1) of the Act, 21 U.S.C. section 360bbb-3(b)(1), unless the authorization is terminated or revoked sooner.     Influenza A by PCR NEGATIVE NEGATIVE Final   Influenza B by PCR NEGATIVE NEGATIVE Final    Comment: (NOTE) The Xpert Xpress SARS-CoV-2/FLU/RSV plus assay is intended as an aid in the diagnosis of influenza from Nasopharyngeal swab specimens and should not be used as a sole basis for  treatment. Nasal washings and aspirates are unacceptable for Xpert Xpress SARS-CoV-2/FLU/RSV testing.  Fact Sheet for Patients: BloggerCourse.com  Fact Sheet for Healthcare Providers: SeriousBroker.it  This test is not yet approved or cleared by the [place] FDA and has been authorized for detection and/or diagnosis of SARS-CoV-2 by FDA under an Emergency Use Authorization (EUA). This EUA will remain in effect (meaning this test can be used) for the duration of the COVID-19 declaration under Section 564(b)(1) of the Act, 21 U.S.C. section 360bbb-3(b)(1),  unless the authorization is terminated or revoked.  Performed at [facility], [address] [place], [place] [address]      Labs: Basic Metabolic Panel: Recent Labs  Lab 10/27/21 1143 10/27/21 2044  NA 134*  --   K 3.8  --   CL 103  --   CO2 24  --   GLUCOSE 101*  --   BUN 18  --   CREATININE 1.12  --   CALCIUM 8.7*  --   MG  --  2.5*   Liver Function Tests: No results for input(s): AST, ALT, ALKPHOS, BILITOT, PROT, ALBUMIN in the last 168 hours. No results for input(s): LIPASE, AMYLASE in the last 168 hours. No results for input(s): AMMONIA in the last 168 hours. CBC: Recent Labs  Lab 10/27/21 1143  WBC 7.6  HGB 13.5  HCT 40.2  MCV 96.2  PLT 256   Cardiac Enzymes: No results for input(s): CKTOTAL, CKMB, CKMBINDEX, TROPONINI in the last 168 hours. BNP: BNP (last 3 results) No results for input(s): BNP in the last 8760 hours.  ProBNP (last 3 results) No results for input(s): PROBNP in the last 8760 hours.  CBG: No results for input(s): GLUCAP in the last 168 hours.     Signed:  Hollice Espy, MD [facility] 10/28/2021, 3:47 PM

## 2021-10-28 NOTE — Progress Notes (Signed)
*  PRELIMINARY RESULTS* Echocardiogram 2D Echocardiogram has been performed.  Cristela Blue 10/28/2021, 8:06 AM

## 2021-10-28 NOTE — Evaluation (Signed)
Occupational Therapy Evaluation Patient Details Name: Edward Campbell MRN: [id number] DOB: Aug 27, 1941 Today's Date: 10/28/2021   History of Present Illness Pt is an 80 y.o. male presenting to hospital 11/20 c/o weakness and unsteady gait; also noted with dysmetria of L UE.  MRI of brain concerning for small area of acute ischemia.  Pt admitted with acute ischemia of R centrum semiovale; (+) COVID-19, diarrhea.  PMH includes htn, HLD, prostatitis, SAH, RLS, Raynauds syndrome, back surgery, and stroke.   Clinical Impression   Pt seen for OT evaluation this date in setting of acute hospitalization. Pt is known to this author from [place] setting. Pt presents this date with decreased activity tolerance and balance is known to have baseline L LE/L UE weakness from h/o CVA. Pt requires: MIN A with progress to CGA with 2WW for ADL transfers and fxl mobility as well as for standing LB ADLs such as bathing/dressing. He is able to perform most UB ADLs with SETUP, requires MIN A for UB dressing this date. Pt transferred to chair with CGA and left with all needs met and in reach as well as SETUP for breakfast. Will continue to follow acutely. Anticipate he will require HHOT initially upon d/c for safety with ADLs considering decreased balance currently.      Recommendations for follow up therapy are one component of a multi-disciplinary discharge planning process, led by the attending physician.  Recommendations may be updated based on patient status, additional functional criteria and insurance authorization.   Follow Up Recommendations  Home health OT    Assistance Recommended at Discharge Intermittent Supervision/Assistance  Functional Status Assessment  Patient has had a recent decline in their functional status and demonstrates the ability to make significant improvements in function in a reasonable and predictable amount of time.  Equipment Recommendations  BSC/3in1;Tub/shower seat    Recommendations  for Other Services       Precautions / Restrictions Precautions Precautions: Fall Restrictions Weight Bearing Restrictions: No      Mobility Bed Mobility Overal bed mobility: Modified Independent             General bed mobility comments: increased time    Transfers Overall transfer level: Needs assistance Equipment used: Rolling walker (2 wheels) Transfers: Sit to/from Stand Sit to Stand: Min guard;Min assist           General transfer comment: MIN A/CGA on first trial. CGA on subsequent trials, good control, cues for safe use of RW      Balance Overall balance assessment: Needs assistance Sitting-balance support: No upper extremity supported;Feet supported Sitting balance-Leahy Scale: Good Sitting balance - Comments: G static   Standing balance support: Bilateral upper extremity supported;During functional activity Standing balance-Leahy Scale: Fair Standing balance comment: CGA with RW                           ADL either performed or assessed with clinical judgement   ADL                                         General ADL Comments: requries SETUP for seated UB ADLs, MIN A to CGA for balance with standing LB ADLs. CGA for transfers,     Vision Baseline Vision/History: 1 Wears glasses Patient Visual Report: No change from baseline Vision Assessment?: Yes Eye Alignment: Within Functional Limits Ocular Range of  Motion: Within Functional Limits Alignment/Gaze Preference: Within Defined Limits     Perception     Praxis      Pertinent Vitals/Pain Pain Assessment: No/denies pain     Hand Dominance     Extremity/Trunk Assessment Upper Extremity Assessment Upper Extremity Assessment: RUE deficits/detail;LUE deficits/detail RUE Deficits / Details: ROM WFL, MMT grossly 4+/5 LUE Deficits / Details: slightly decreased ROM/strength in L UE from h/o strokes. ROM ~3/4 range and shld flexion MMT 3-/4, elbow and grip grossly  4-/5   Lower Extremity Assessment Lower Extremity Assessment: Defer to PT evaluation LLE Deficits / Details: 4+/5 hip flexion, knee flexion/extension, and DF/PF (intact light touch sensation and proprioception) LLE Coordination: decreased gross motor (decreased quality heel to shin coordination)   Cervical / Trunk Assessment Cervical / Trunk Assessment: Normal   Communication Communication Communication: HOH   Cognition Arousal/Alertness: Awake/alert Behavior During Therapy: WFL for tasks assessed/performed Overall Cognitive Status: Within Functional Limits for tasks assessed                                       General Comments       Exercises Other Exercises Other Exercises: OT ed with pt re: safety considerations, fall prevention, correct use of RW. Pt with good understanding   Shoulder Instructions      Home Living Family/patient expects to be discharged to:: Private residence Living Arrangements: Alone Available Help at Discharge: Other (Comment) (family members and church members check on pt and help run errands) Type of Home: House Home Access: Level entry     Home Layout: One level     Bathroom Shower/Tub: Occupational psychologist: Standard     Home Equipment: Rollator (4 wheels);Cane - single point;Grab bars - tub/shower;Shower seat - built in          Prior Functioning/Environment Prior Level of Function : Needs assist             Mobility Comments: Pt ambulatory with 4ww; will walk short distances in home without assistive device; no falls in past 6 months ADLs Comments: Family/church members assists pt with errands        OT Problem List: Decreased activity tolerance;Impaired balance (sitting and/or standing);Decreased coordination      OT Treatment/Interventions: Self-care/ADL training;Therapeutic exercise;Therapeutic activities    OT Goals(Current goals can be found in the care plan section) Acute Rehab OT  Goals Patient Stated Goal: to go home OT Goal Formulation: With patient Time For Goal Achievement: 11/11/21 Potential to Achieve Goals: Good ADL Goals Pt Will Perform Upper Body Dressing: with modified independence;sitting Pt Will Perform Lower Body Dressing: with modified independence;sit to/from stand Pt Will Transfer to Toilet: with modified independence;ambulating Pt Will Perform Toileting - Clothing Manipulation and hygiene: with modified independence;sit to/from stand Pt/caregiver will Perform Home Exercise Program: Increased strength;Both right and left upper extremity;With Supervision [facility] in L UE)  OT Frequency: Min 1X/week   Barriers to D/C: Decreased caregiver support  has supportive family and friends, but no one that can be with him 24/7       Co-evaluation              AM-PAC OT "6 Clicks" Daily Activity     Outcome Measure Help from another person eating meals?: None Help from another person taking care of personal grooming?: None Help from another person toileting, which includes using toliet, bedpan, or urinal?:  A Little Help from another person bathing (including washing, rinsing, drying)?: A Little Help from another person to put on and taking off regular upper body clothing?: None Help from another person to put on and taking off regular lower body clothing?: A Little 6 Click Score: 21   End of Session Equipment Utilized During Treatment: Gait belt;Rolling walker (2 wheels) Nurse Communication: Mobility status  Activity Tolerance: Patient tolerated treatment well Patient left: with call bell/phone within reach;in chair;with chair alarm set  OT Visit Diagnosis: Unsteadiness on feet (R26.81);Muscle weakness (generalized) (M62.81);Other (comment) (other lack of coordination)                Time: 4210-3128 OT Time Calculation (min): 35 min Charges:  OT General Charges $OT Visit: 1 Visit OT Evaluation $OT Eval Moderate Complexity: 1 Mod OT  Treatments $Self Care/Home Management : 8-22 mins $Therapeutic Activity: 8-22 mins  Gerrianne Scale, MS, OTR/L ascom [phone] 10/28/21, 2:44 PM

## 2021-11-27 ENCOUNTER — Other Ambulatory Visit: Payer: Self-pay | Admitting: Orthopedic Surgery

## 2021-11-27 DIAGNOSIS — M4807 Spinal stenosis, lumbosacral region: Secondary | ICD-10-CM

## 2021-11-27 DIAGNOSIS — M5136 Other intervertebral disc degeneration, lumbar region: Secondary | ICD-10-CM

## 2021-12-07 ENCOUNTER — Ambulatory Visit: Payer: Medicare Other

## 2022-01-06 ENCOUNTER — Other Ambulatory Visit: Payer: Self-pay

## 2022-01-06 ENCOUNTER — Ambulatory Visit
Admission: RE | Admit: 2022-01-06 | Discharge: 2022-01-06 | Disposition: A | Discharge status: Home / Self Care | Payer: Medicare HMO | Source: Ambulatory Visit | Attending: Orthopedic Surgery | Admitting: Orthopedic Surgery

## 2022-01-06 DIAGNOSIS — M5136 Other intervertebral disc degeneration, lumbar region: Secondary | ICD-10-CM | POA: Diagnosis present

## 2022-01-06 DIAGNOSIS — M4807 Spinal stenosis, lumbosacral region: Secondary | ICD-10-CM | POA: Insufficient documentation

## 2022-01-06 IMAGING — MR MR LUMBAR SPINE W/O CM
5 series · 31 of 48 positions shown · non-contrast
Comparison: None.

CLINICAL DATA: Chronic low back pain for 40 years, worsening

EXAM:
MRI LUMBAR SPINE WITHOUT CONTRAST
TECHNIQUE: Multiplanar, multisequence MR imaging of the lumbar spine was
performed. No intravenous contrast was administered.

[Series 5: T2 · sagittal · 4.0mm · 0.81mm/px · 6 of 17 slices shown (1 of 2)]
[im 1/17]
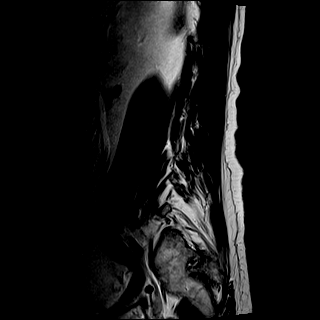
[im 4/17]
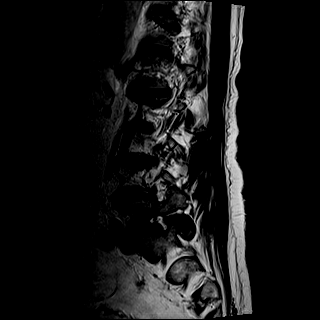
[im 7/17]
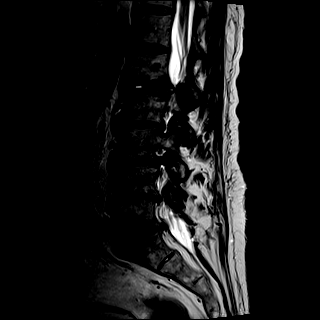
[im 10/17]
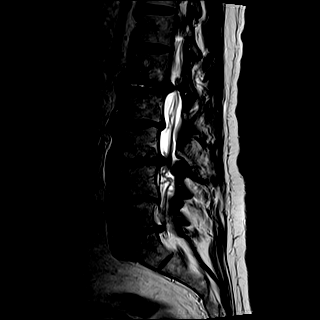
[im 13/17]
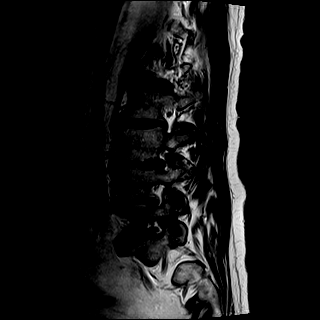
[im 17/17]
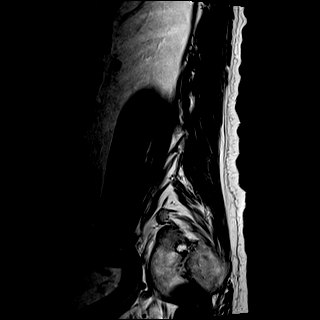

[Series 6: T1 · sagittal · 4.0mm · 0.81mm/px · 7 of 17 slices shown (1 of 2)]
[im 1/17]
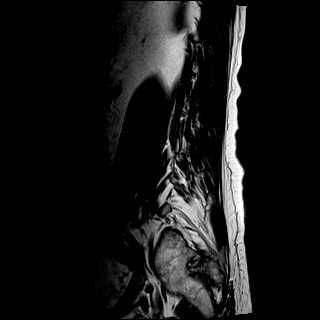
[im 3/17]
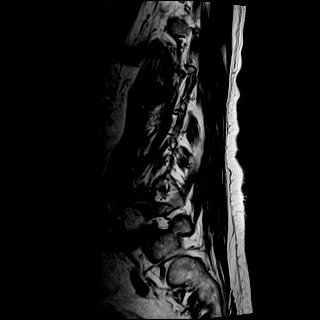
[im 6/17]
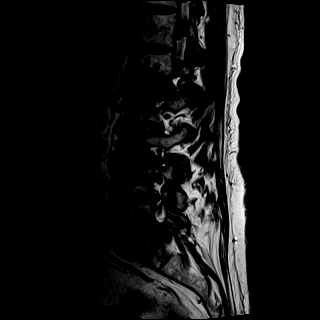
[im 9/17]
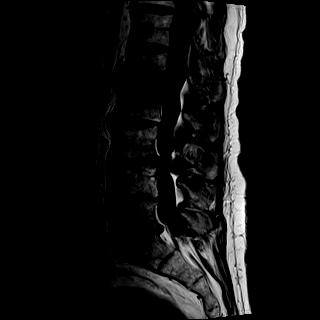
[im 11/17]
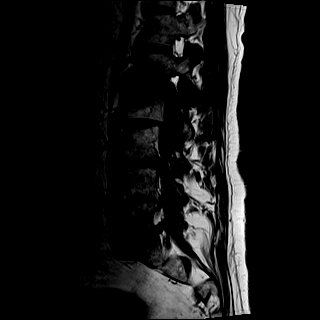
[im 14/17]
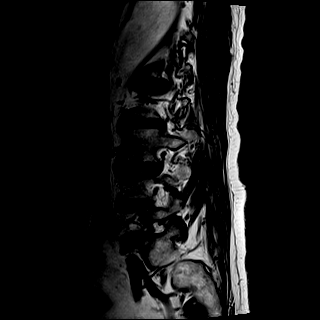
[im 17/17]
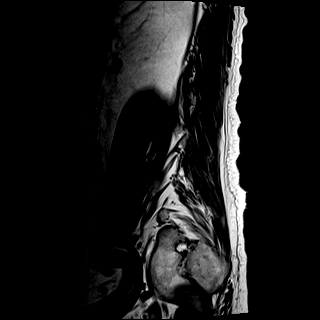

[Series 7: STIR · sagittal · 4.0mm · 0.41mm/px · 2 of 17 slices shown]
[im 1/17]
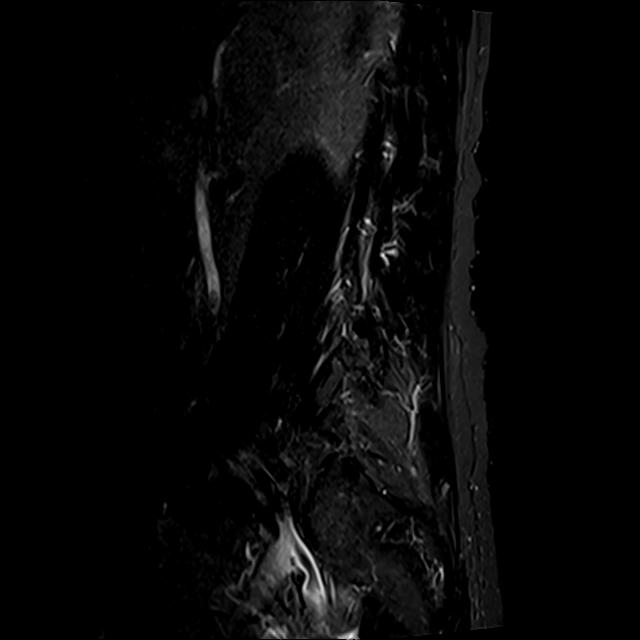
[im 3/17]
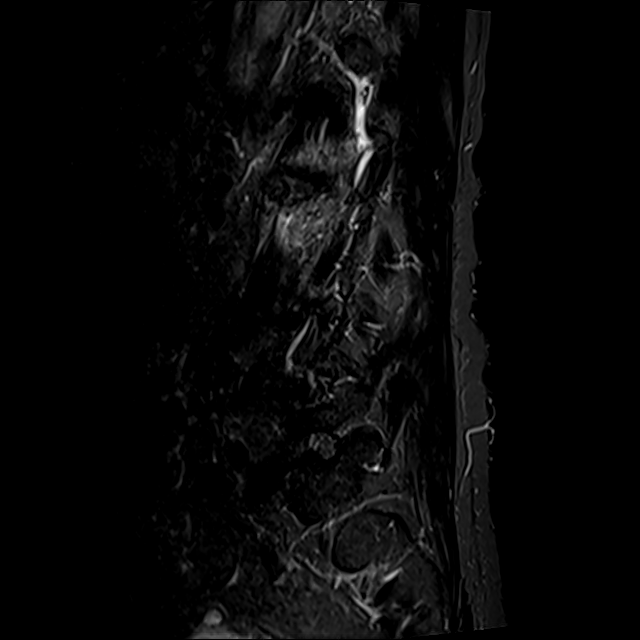

[Series 8: T2 · axial · 4.0mm · 0.78mm/px · z∈[-69,+91]mm · 8 of 33 slices shown (2 of 2)]
[im 1/33]
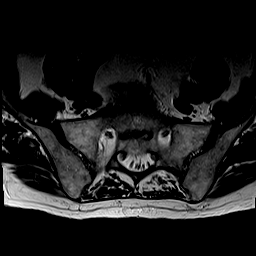
[im 5/33]
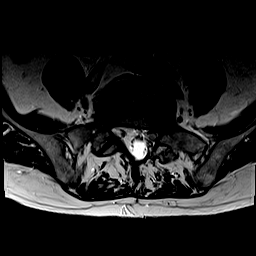
[im 10/33]
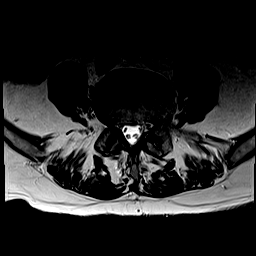
[im 15/33]
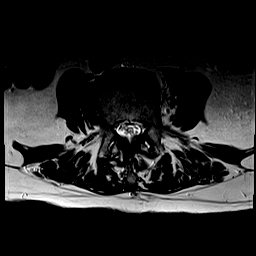
[im 18/33]
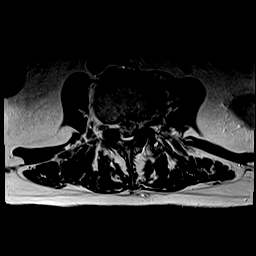
[im 23/33]
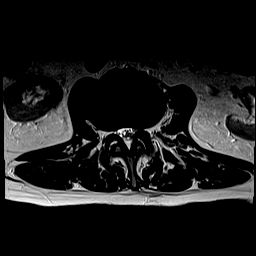
[im 28/33]
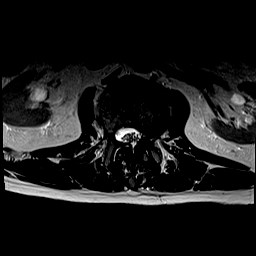
[im 33/33]
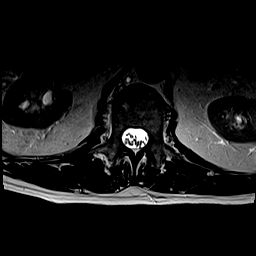

[Series 9: T1 · axial · 4.0mm · 0.39mm/px · z∈[-69,+91]mm · 8 of 33 slices shown (2 of 2)]
[im 1/33]
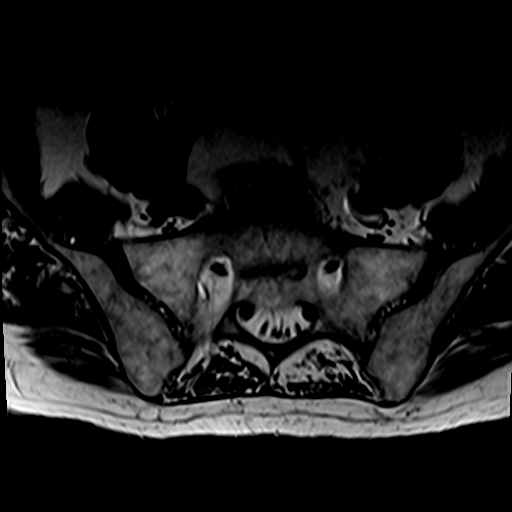
[im 5/33]
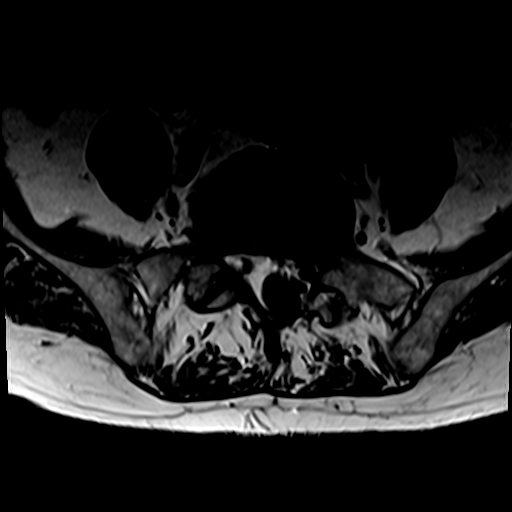
[im 10/33]
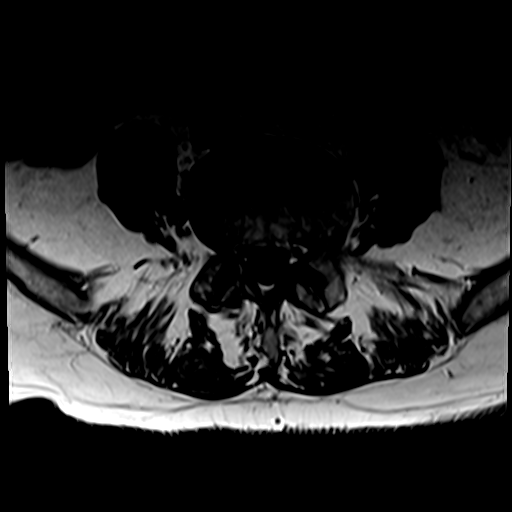
[im 15/33]
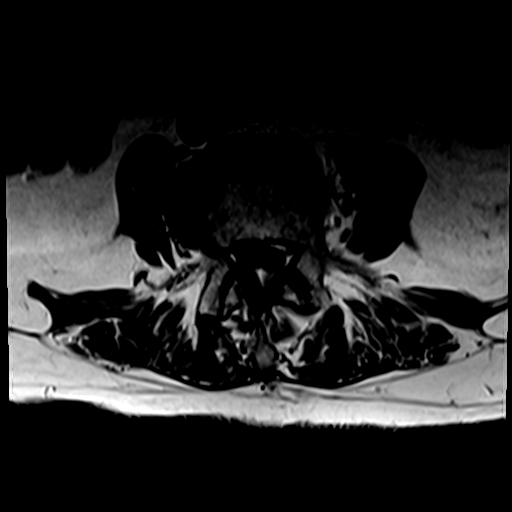
[im 18/33]
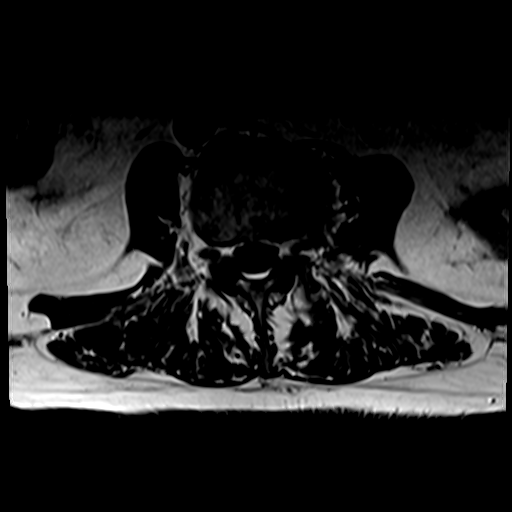
[im 23/33]
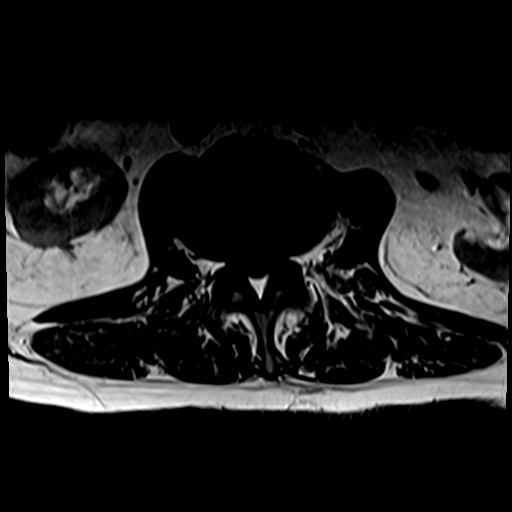
[im 28/33]
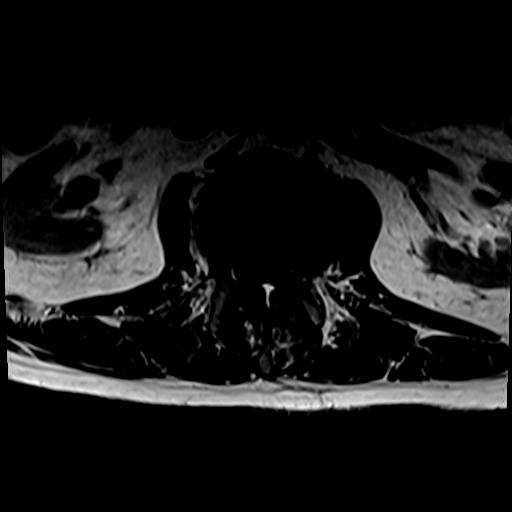
[im 33/33]
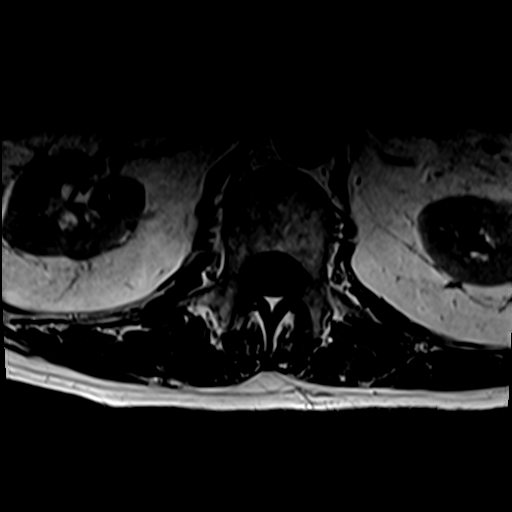

[31 of 48 positions shown; findings below may reference images not displayed]

FINDINGS: Segmentation:  5 lumbar type vertebrae

Alignment: Generalized straightening of the lumbar spine in the
sagittal plane. Mild dextroscoliosis.

Vertebrae: Diffuse disc T2 hyperintensity with endplate irregularity
and marrow edema at L1-2. No paravertebral edema, favor degenerative
process but need clinical correlation for infection.

Conus medullaris and cauda equina: Conus extends to the T12-L1
level. Conus appears normal. There is cauda equina redundancy at the
level of L3-4 spinal stenosis.

Paraspinal and other soft tissues: Trabeculated appearance of the
bladder. Renal hilar cysts on both sides.

Disc levels:

L1-L2: Disc collapse with endplate irregularity, bulging, and
ridging. Degenerative facet spurring. Ligamentous thickening.
Moderate spinal stenosis with crowding of the cauda equina. Moderate
left foraminal narrowing

L2-L3: Disc narrowing and bulging with facet and ligamentous
thickening. Mild spinal stenosis.

L3-L4: Disc collapse and endplate degeneration with disc bulging and
ridging. Ligamentum flavum thickening and facet hypertrophy.
Advanced spinal stenosis. Moderate bilateral foraminal narrowing.

L4-L5: Disc collapse and endplate/facet spurring. The canal and
foramina are patent

L5-S1:Disc narrowing and bulging with endplate spurring. Bilateral
facet spurring. Prior left-sided laminotomy with scar-like
appearance at the left subarticular recess.
IMPRESSION: 1. Disc collapse and disc/endplate edema at L1-2, favor degenerative
but please correlate for any infectious signs/labs. Degenerative
changes at this level cause moderate spinal stenosis at this level.
2. Generalized lumbar spine degeneration with scoliosis.
3. L3-4 severe degenerative spinal stenosis.
4. L5-S1 decompression. Epidural scarring without neural
compression.

## 2022-01-10 ENCOUNTER — Encounter (INDEPENDENT_AMBULATORY_CARE_PROVIDER_SITE_OTHER): Payer: Self-pay | Admitting: Vascular Surgery

## 2022-01-28 ENCOUNTER — Encounter (INDEPENDENT_AMBULATORY_CARE_PROVIDER_SITE_OTHER): Payer: Self-pay | Admitting: Vascular Surgery

## 2022-02-10 ENCOUNTER — Encounter (INDEPENDENT_AMBULATORY_CARE_PROVIDER_SITE_OTHER): Payer: Self-pay

## 2022-02-10 ENCOUNTER — Encounter (INDEPENDENT_AMBULATORY_CARE_PROVIDER_SITE_OTHER): Payer: Self-pay | Admitting: Vascular Surgery

## 2022-02-10 ENCOUNTER — Other Ambulatory Visit (INDEPENDENT_AMBULATORY_CARE_PROVIDER_SITE_OTHER): Payer: Self-pay | Admitting: Vascular Surgery

## 2022-02-10 DIAGNOSIS — R29898 Other symptoms and signs involving the musculoskeletal system: Secondary | ICD-10-CM

## 2022-04-18 ENCOUNTER — Encounter (INDEPENDENT_AMBULATORY_CARE_PROVIDER_SITE_OTHER): Payer: Medicare Other | Admitting: Vascular Surgery

## 2022-10-06 ENCOUNTER — Encounter (INDEPENDENT_AMBULATORY_CARE_PROVIDER_SITE_OTHER): Payer: Self-pay

## 2023-12-24 ENCOUNTER — Ambulatory Visit: Payer: Medicare Other | Admitting: Urology

## 2023-12-24 ENCOUNTER — Encounter: Payer: Self-pay | Admitting: Urology

## 2023-12-24 DIAGNOSIS — R972 Elevated prostate specific antigen [PSA]: Secondary | ICD-10-CM

## 2023-12-24 NOTE — Progress Notes (Signed)
I, Maysun Anabel Bene, acting as a scribe for Riki Altes, MD., have documented all relevant documentation on the behalf of Riki Altes, MD, as directed by Riki Altes, MD while in the presence of Riki Altes, MD.  12/24/2023 4:31 PM   Dortha Kern 09-Apr-1941 [id number]  Referring provider: Mertie Moores, MD [phone] [facility] [address] [facility] - PULMONOLOGY [place],  [place] [address]  Chief Complaint  Patient presents with   Elevated PSA    HPI: Edward Campbell is a 83 y.o. male referred for evaluation of an elevated PSA.   Previously followed by Dr. Achilles Dunk at [facility] for mild PSA elevation. He was on finasteride for BPH and uncorrected PSA, ranged from the upper 1-low 2 range.  Past records are remarkable for a prostate biopsy though he does not recall this ever being done.  He had not had a PSA since around 2019, and a PSA was performed 06/01/23 which was 7.09 (uncorrected). This was repeated on 11/26/23 and was 5.12 (uncorrected).  He remains on finasteride and has no bothersome lower urinary tract symptoms.   PMH: Past Medical History:  Diagnosis Date   Hyperlipidemia    Hypertension    Stroke [facility])     Surgical History: Past Surgical History:  Procedure Laterality Date   BACK SURGERY      Home Medications:  Allergies as of 12/24/2023       Reactions   Albumin Human Other (See Comments)   Refuses all blood products as one of [employer]        Medication List        Accurate as of December 24, 2023  4:31 PM. If you have any questions, ask your nurse or doctor.          amLODipine 5 MG tablet Commonly known as: NORVASC Take 5 mg by mouth daily.   aspirin EC 81 MG tablet Take 1 tablet (81 mg total) by mouth daily at 6 (six) AM.   atorvastatin 40 MG tablet Commonly known as: LIPITOR Take 40 mg by mouth at bedtime.   finasteride 5 MG tablet Commonly known as: PROSCAR Take 5 mg by mouth daily.   folic acid 1 MG  tablet Commonly known as: FOLVITE Take 2 mg by mouth daily.        Allergies:  Allergies  Allergen Reactions   Albumin Human Other (See Comments)    Refuses all blood products as one of [employer]    Family History: Family History  Problem Relation Age of Onset   Hyperlipidemia Father     Social History:  reports that he has never smoked. He has never used smokeless tobacco. He reports current alcohol use. He reports that he does not use drugs.   Physical Exam: There were no vitals taken for this visit.  Constitutional:  Alert and oriented, No acute distress. HEENT: Aspers AT, moist mucus membranes.  Trachea midline, no masses. Cardiovascular: No clubbing, cyanosis, or edema. Respiratory: Normal respiratory effort, no increased work of breathing. GI: Abdomen is soft, nontender, nondistended, no abdominal masses GU: Prostate 40 grams, smooth without nodules.  Skin: No rashes, bruises or suspicious lesions. Neurologic: Grossly intact, no focal deficits, moving all 4 extremities. Psychiatric: Normal mood and affect.   Assessment & Plan:    1. Elevated PSA We discussed various prostate cancer screening guidelines do not recommend PSA screening past age 94 due to the low chance of high-grade prostate cancer.  DRE is benign.  Options were discussed including surveillance, prostate biopsy and prostate MRI.  Based on his age, he has elected surveillance and will plan on a follow-up PSA in 6 months. Follow at PSA at 6 months.  [facility] [address] [place], [place] [address] [phone]

## 2024-03-14 ENCOUNTER — Other Ambulatory Visit: Payer: Self-pay | Admitting: *Deleted

## 2024-03-14 DIAGNOSIS — R972 Elevated prostate specific antigen [PSA]: Secondary | ICD-10-CM

## 2024-03-23 ENCOUNTER — Other Ambulatory Visit: Payer: Self-pay

## 2024-04-11 ENCOUNTER — Emergency Department

## 2024-04-11 ENCOUNTER — Encounter: Payer: Self-pay | Admitting: Emergency Medicine

## 2024-04-11 ENCOUNTER — Other Ambulatory Visit: Payer: Self-pay

## 2024-04-11 ENCOUNTER — Emergency Department
Admission: EM | Admit: 2024-04-11 | Discharge: 2024-04-12 | Disposition: A | Discharge status: Home / Self Care | Attending: Emergency Medicine | Admitting: Emergency Medicine

## 2024-04-11 DIAGNOSIS — R42 Dizziness and giddiness: Secondary | ICD-10-CM | POA: Diagnosis present

## 2024-04-11 DIAGNOSIS — R9431 Abnormal electrocardiogram [ECG] [EKG]: Secondary | ICD-10-CM | POA: Diagnosis not present

## 2024-04-11 DIAGNOSIS — Z8673 Personal history of transient ischemic attack (TIA), and cerebral infarction without residual deficits: Secondary | ICD-10-CM | POA: Diagnosis not present

## 2024-04-11 DIAGNOSIS — I10 Essential (primary) hypertension: Secondary | ICD-10-CM | POA: Insufficient documentation

## 2024-04-11 DIAGNOSIS — E785 Hyperlipidemia, unspecified: Secondary | ICD-10-CM | POA: Diagnosis not present

## 2024-04-11 DIAGNOSIS — I6782 Cerebral ischemia: Secondary | ICD-10-CM | POA: Insufficient documentation

## 2024-04-11 LAB — CBC
HCT: 40.8 % (ref 39.0–52.0)
Hemoglobin: 13.6 g/dL (ref 13.0–17.0)
MCH: 32.3 pg (ref 26.0–34.0)
MCHC: 33.3 g/dL (ref 30.0–36.0)
MCV: 96.9 fL (ref 80.0–100.0)
Platelets: 230 10*3/uL (ref 150–400)
RBC: 4.21 MIL/uL — ABNORMAL LOW (ref 4.22–5.81)
RDW: 13.1 % (ref 11.5–15.5)
WBC: 6.3 10*3/uL (ref 4.0–10.5)
nRBC: 0 % (ref 0.0–0.2)

## 2024-04-11 LAB — COMPREHENSIVE METABOLIC PANEL WITH GFR
ALT: 12 U/L (ref 0–44)
AST: 16 U/L (ref 15–41)
Albumin: 3.9 g/dL (ref 3.5–5.0)
Alkaline Phosphatase: 43 U/L (ref 38–126)
Anion gap: 8 (ref 5–15)
BUN: 24 mg/dL — ABNORMAL HIGH (ref 8–23)
CO2: 25 mmol/L (ref 22–32)
Calcium: 8.8 mg/dL — ABNORMAL LOW (ref 8.9–10.3)
Chloride: 104 mmol/L (ref 98–111)
Creatinine, Ser: 1.05 mg/dL (ref 0.61–1.24)
GFR, Estimated: >60 mL/min (ref >60)
Glucose, Bld: 102 mg/dL — ABNORMAL HIGH (ref 70–99)
Potassium: 4.9 mmol/L (ref 3.5–5.1)
Sodium: 137 mmol/L (ref 135–145)
Total Bilirubin: 0.7 mg/dL (ref 0.0–1.2)
Total Protein: 6.6 g/dL (ref 6.5–8.1)

## 2024-04-11 LAB — PROTIME-INR
INR: 1.1 (ref 0.8–1.2)
Prothrombin Time: 14.3 s (ref 11.4–15.2)

## 2024-04-11 NOTE — ED Triage Notes (Signed)
 Arrives from home c/o dizziness.  VS wnl. Dizziness started today. STroke screen negative per EMS.  169/95 74 97% CBG: 101

## 2024-04-11 NOTE — ED Triage Notes (Signed)
 Patient to ED via [facility] from home for dizziness. Pt reports it started around 12. Pt reports dizziness not as bad as when it started. Friend states an increase in confusion over the last year. Pt reports "I don't have a memory anymore."

## 2024-04-12 NOTE — ED Provider Notes (Signed)
 [facility] Provider Note    Event Date/Time   First MD Initiated Contact with Patient 04/11/24 2347     (approximate)   History   Chief Complaint Dizziness   HPI  Edward Campbell is a 83 y.o. male with past medical history of hypertension, hyperlipidemia, and stroke who presents to the ED complaining of dizziness.  Patient reports that he woke up feeling slightly dizzy earlier this morning.  He describes it as an intermittent lightheadedness, which does not seem to be associated with positional changes.  He denies any feeling that the room is spinning around him and denies any unsteadiness on his feet greater than his baseline.  He has not had any vision changes, speech changes, numbness, or weakness.  Since arriving to the ED, he states that he no longer feels dizzy.  He has not had any chest pain or shortness of breath with these episodes.     Physical Exam   Triage Vital Signs: ED Triage Vitals  Encounter Vitals Group     BP 04/11/24 1816 (!) 139/92     Systolic BP Percentile --      Diastolic BP Percentile --      Pulse Rate 04/11/24 1816 (!) 59     Resp 04/11/24 1816 17     Temp 04/11/24 1816 98 F (36.7 C)     Temp Source 04/11/24 1816 Oral     SpO2 04/11/24 1816 98 %     Weight 04/11/24 1817 158 lb 11.7 oz (72 kg)     Height 04/11/24 1817 5\' 8"  (1.727 m)     Head Circumference --      Peak Flow --      Pain Score 04/11/24 1817 0     Pain Loc --      Pain Education --      Exclude from Growth Chart --     Most recent vital signs: Vitals:   04/11/24 1816  BP: (!) 139/92  Pulse: (!) 59  Resp: 17  Temp: 98 F (36.7 C)  SpO2: 98%    Constitutional: Alert and oriented. Eyes: Conjunctivae are normal. Head: Atraumatic. Nose: No congestion/rhinnorhea. Mouth/Throat: Mucous membranes are moist.  Cardiovascular: Normal rate, regular rhythm. Grossly normal heart sounds.  2+ radial pulses bilaterally. Respiratory: Normal respiratory  effort.  No retractions. Lungs CTAB. Gastrointestinal: Soft and nontender. No distention. Musculoskeletal: No lower extremity tenderness nor edema.  Neurologic:  Normal speech and language. No gross focal neurologic deficits are appreciated.    ED Results / Procedures / Treatments   Labs (all labs ordered are listed, but only abnormal results are displayed) Labs Reviewed  COMPREHENSIVE METABOLIC PANEL WITH GFR - Abnormal; Notable for the following components:      Result Value   Glucose, Bld 102 (*)    BUN 24 (*)    Calcium  8.8 (*)    All other components within normal limits  CBC - Abnormal; Notable for the following components:   RBC 4.21 (*)    All other components within normal limits  PROTIME-INR     EKG  ED ECG REPORT I, Twilla Galea, the attending physician, personally viewed and interpreted this ECG.   Date: 04/12/2024  EKG Time: 18:12  Rate: 68  Rhythm: normal sinus rhythm  Axis: LAD  Intervals:none  ST&T Change: None  RADIOLOGY CT head reviewed and interpreted by me with no hemorrhage or midline shift.  PROCEDURES:  Critical Care performed: No  Procedures  MEDICATIONS ORDERED IN ED: Medications - No data to display   IMPRESSION / MDM / ASSESSMENT AND PLAN / ED COURSE  I reviewed the triage vital signs and the nursing notes.                              83 y.o. male with past medical history of hypertension, hyperlipidemia, and stroke who presents to the ED for intermittent dizziness and lightheadedness today.  Patient's presentation is most consistent with acute presentation with potential threat to life or bodily function.  Differential diagnosis includes, but is not limited to, arrhythmia, anemia, electrolyte abnormality, AKI, stroke, dehydration, orthostatic hypotension.  Patient nontoxic-appearing and in no acute distress, vital signs are unremarkable.  At the time of my assessment, he denies any complaints and states that dizziness has  resolved.  He has a nonfocal neurologic exam and with no vertiginous symptoms or other neurologic symptoms, I doubt stroke.  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  Labs without significant anemia, leukocytosis, electrolyte abnormality, or AKI.  LFTs and coags also unremarkable.  Patient able to ambulate with assistance, which is his baseline.  He is appropriate for discharge home with outpatient PCP and neurology follow-up, was counseled to return to the ED for new or worsening symptoms.  Patient agrees with plan.      FINAL CLINICAL IMPRESSION(S) / ED DIAGNOSES   Final diagnoses:  Dizziness     Rx / DC Orders   ED Discharge Orders     None        Note:  This document was prepared using Dragon voice recognition software and may include unintentional dictation errors.   Twilla Galea, MD 04/12/24 [phone]

## 2024-04-12 NOTE — ED Notes (Signed)
Pt ambulated with assistance.
